# Patient Record
Sex: Male | Born: 1955
Health system: Southern US, Community
[De-identification: ages and names within clinical notes are randomized; demographics above are authoritative.]

## PROBLEM LIST (undated history)

## (undated) DIAGNOSIS — I509 Heart failure, unspecified: Secondary | ICD-10-CM

## (undated) DIAGNOSIS — C801 Malignant (primary) neoplasm, unspecified: Secondary | ICD-10-CM

## (undated) DIAGNOSIS — T8859XA Other complications of anesthesia, initial encounter: Secondary | ICD-10-CM

## (undated) DIAGNOSIS — I10 Essential (primary) hypertension: Secondary | ICD-10-CM

## (undated) DIAGNOSIS — K219 Gastro-esophageal reflux disease without esophagitis: Secondary | ICD-10-CM

## (undated) DIAGNOSIS — E7800 Pure hypercholesterolemia, unspecified: Secondary | ICD-10-CM

## (undated) DIAGNOSIS — J45909 Unspecified asthma, uncomplicated: Secondary | ICD-10-CM

## (undated) DIAGNOSIS — I219 Acute myocardial infarction, unspecified: Secondary | ICD-10-CM

## (undated) DIAGNOSIS — T4145XA Adverse effect of unspecified anesthetic, initial encounter: Secondary | ICD-10-CM

## (undated) HISTORY — DX: Unspecified asthma, uncomplicated: J45.909

## (undated) HISTORY — PX: COLONOSCOPY: SHX174

## (undated) HISTORY — DX: Gastro-esophageal reflux disease without esophagitis: K21.9

## (undated) HISTORY — DX: Pure hypercholesterolemia, unspecified: E78.00

## (undated) HISTORY — PX: VASECTOMY: SHX75

---
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
## 1998-09-11 ENCOUNTER — Ambulatory Visit (HOSPITAL_COMMUNITY): Admission: RE | Admit: 1998-09-11 | Discharge: 1998-09-11 | Payer: Self-pay | Admitting: Geriatric Medicine

## 2013-01-20 ENCOUNTER — Encounter: Payer: Self-pay | Admitting: Internal Medicine

## 2013-01-24 ENCOUNTER — Ambulatory Visit (INDEPENDENT_AMBULATORY_CARE_PROVIDER_SITE_OTHER)
Admission: RE | Admit: 2013-01-24 | Discharge: 2013-01-24 | Disposition: A | Discharge status: Home / Self Care | Payer: BC Managed Care – PPO | Source: Ambulatory Visit | Attending: Internal Medicine | Admitting: Internal Medicine

## 2013-01-24 ENCOUNTER — Encounter: Payer: Self-pay | Admitting: Internal Medicine

## 2013-01-24 ENCOUNTER — Other Ambulatory Visit: Payer: BC Managed Care – PPO

## 2013-01-24 ENCOUNTER — Ambulatory Visit (INDEPENDENT_AMBULATORY_CARE_PROVIDER_SITE_OTHER): Payer: BC Managed Care – PPO | Admitting: Internal Medicine

## 2013-01-24 VITALS — BP 138/84 | HR 105 | Ht 69.5 in | Wt 183.8 lb

## 2013-01-24 DIAGNOSIS — J4520 Mild intermittent asthma, uncomplicated: Secondary | ICD-10-CM

## 2013-01-24 DIAGNOSIS — J45909 Unspecified asthma, uncomplicated: Secondary | ICD-10-CM

## 2013-01-24 DIAGNOSIS — J309 Allergic rhinitis, unspecified: Secondary | ICD-10-CM

## 2013-01-24 DIAGNOSIS — J45998 Other asthma: Secondary | ICD-10-CM

## 2013-01-24 DIAGNOSIS — K219 Gastro-esophageal reflux disease without esophagitis: Secondary | ICD-10-CM

## 2013-01-24 MED ORDER — FLUTICASONE FUROATE-VILANTEROL 100-25 MCG/INH IN AEPB: 1.0000 | INHALATION_SPRAY | Freq: Every day | RESPIRATORY_TRACT | Status: DC

## 2013-01-24 NOTE — Progress Notes (Signed)
Subjective:    Patient ID: Jerry Montoya, male    DOB: 10/29/1955, 57 y.o.   MRN: [id number]  HPI 01/24/13- 48 yoM never smoker Ref by Dr. Pete Glatter for help with management of moderate asthma-- pt reports had bad episode of reflux and aspiration which caused asthma attack -- currently using ventolin and advair. This event woke him may fifth. He recognized that he had aspirated and called 911. Now diet and Nexium management for reflux. Some past history of allergic rhinitis. Asthma with bronchitis since age 64, attributed to allergy and seasonal-spring and fall. Tends to need prednisone for rhinitis. Occasional use of rescue inhaler. Some history of mild exercise-induced asthma. Currently feels well. 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 Lives with wife and daughter. No family history of asthma.  Prior to Admission medications   Medication Sig Start Date End Date Taking? Authorizing Provider  albuterol (PROVENTIL HFA;VENTOLIN HFA) 108 (90 BASE) MCG/ACT inhaler Inhale 2 puffs into the lungs every 4 (four) hours as needed for wheezing.   Yes Historical Provider, MD  esomeprazole (NEXIUM) 40 MG capsule Take 40 mg by mouth daily before breakfast.   Yes Historical Provider, MD  Fluticasone Furoate-Vilanterol (BREO ELLIPTA) 100-25 MCG/INH AEPB Inhale 1 puff into the lungs daily. 01/24/13   Waymon Budge, MD  Fluticasone-Salmeterol (ADVAIR) 250-50 MCG/DOSE AEPB Inhale 1 puff into the lungs every 12 (twelve) hours.   Yes Historical Provider, MD   Past Medical History  Diagnosis Date  . Asthma   . Hypercholesterolemia   . Esophageal reflux    No past surgical history on file. Family History  Problem Relation Age of Onset  . Coronary artery disease Father    History   Social History  . Marital Status: Single    Spouse Name: N/A    Number of Children: 2  . Years of Education: N/A   Occupational History  . computer tech    Social History Main Topics  . Smoking  status: Never Smoker   . Smokeless tobacco: Never Used  . Alcohol Use: No  . Drug Use: No  . Sexually Active: Not on file   Other Topics Concern  . Not on file   Social History Narrative  . No narrative on file    Review of Systems  Constitutional: Negative for fever and unexpected weight change.  HENT: Negative for ear pain, nosebleeds, congestion, sore throat, rhinorrhea, sneezing, trouble swallowing, dental problem, postnasal drip and sinus pressure.   Eyes: Negative for redness and itching.  Respiratory: Negative for cough, chest tightness, shortness of breath and wheezing.   Cardiovascular: Negative for palpitations and leg swelling.  Gastrointestinal: Negative for nausea and vomiting.  Genitourinary: Negative for dysuria.  Musculoskeletal: Negative for joint swelling.  Skin: Negative for rash.  Neurological: Negative for headaches.  Hematological: Does not bruise/bleed easily.  Psychiatric/Behavioral: Negative for dysphoric mood. The patient is not nervous/anxious.       Objective:   Physical Exam OBJ- Physical Exam General- Alert, Oriented, Affect-appropriate/ calm, Distress- none acute. Trim Skin- rash-none, lesions- none, excoriation- none Lymphadenopathy- none Head- atraumatic            Eyes- Gross vision intact, PERRLA, conjunctivae and secretions clear            Ears- Hearing, canals-normal            Nose- Clear, no-Septal dev, mucus, polyps, erosion, perforation             Throat-  Mallampati II , mucosa clear , drainage- none, tonsils- atrophic Neck- flexible , trachea midline, no stridor , thyroid nl, carotid no bruit Chest - symmetrical excursion , unlabored           Heart/CV- RRR , no murmur , no gallop  , no rub, nl s1 s2                           - JVD- none , edema- none, stasis changes- none, varices- none           Lung- clear to P&A, wheeze- none, cough+dry, with deep breath , dullness-none, rub- none           Chest wall-  Abd- tender-no,  distended-no, bowel sounds-present, HSM- no Br/ Gen/ Rectal- Not done, not indicated Extrem- cyanosis- none, clubbing, none, atrophy- none, strength- nl Neuro- grossly intact to observation         Assessment & Plan:

## 2013-01-24 NOTE — Patient Instructions (Addendum)
Sample Breo Ellipta inhaler   1 puff, then rinse mouth, just once daily     Try this instead of Advair for comparison, When the sample is done, go back to Advair  Order- lab Allergy profile    Dx allergic rhinitis, asthma             CXR- dx asthma              Schedule PFT   Dx asthma

## 2013-01-25 LAB — ALLERGY FULL PROFILE
Alternaria Alternata: <0.1 kU/L
Bahia Grass: 0.16 kU/L — ABNORMAL HIGH
Bermuda Grass: 0.14 kU/L — ABNORMAL HIGH
Cat Dander: 14.5 kU/L — ABNORMAL HIGH
Curvularia lunata: <0.1 kU/L
Dog Dander: 2.76 kU/L — ABNORMAL HIGH
Fescue: 0.14 kU/L — ABNORMAL HIGH
Goldenrod: 0.12 kU/L — ABNORMAL HIGH
Helminthosporium halodes: <0.1 kU/L
Lamb's Quarters: 0.23 kU/L — ABNORMAL HIGH
Sycamore Tree: 0.12 kU/L — ABNORMAL HIGH

## 2013-01-25 NOTE — Progress Notes (Signed)
Quick Note:  Patient returned call. Advised of cxr results / recs as stated by CDY. Pt verbalized understanding and denied any questions. ______

## 2013-01-26 ENCOUNTER — Telehealth: Payer: Self-pay | Admitting: Internal Medicine

## 2013-01-26 NOTE — Progress Notes (Signed)
Quick Note:  LMTCB ______ 

## 2013-01-26 NOTE — Telephone Encounter (Signed)
Result Note    Allergy profile- significant elevations of allergy class antibodies to broad classes of common allergens. We will discuss at next ov.   I spoke with patient about results and he verbalized understanding and had no questions

## 2013-02-06 ENCOUNTER — Encounter: Payer: Self-pay | Admitting: Internal Medicine

## 2013-02-06 DIAGNOSIS — K219 Gastro-esophageal reflux disease without esophagitis: Secondary | ICD-10-CM | POA: Insufficient documentation

## 2013-02-06 DIAGNOSIS — J45909 Unspecified asthma, uncomplicated: Secondary | ICD-10-CM | POA: Insufficient documentation

## 2013-02-06 DIAGNOSIS — J45998 Other asthma: Secondary | ICD-10-CM | POA: Insufficient documentation

## 2013-02-06 NOTE — Assessment & Plan Note (Signed)
Sleep-related GERD event described. He is now on Nexium and understands not eat before bed.

## 2013-02-06 NOTE — Assessment & Plan Note (Signed)
Identified triggers include reflux, seasonal allergy, exercise. He understands major importance of controlling reflux. He minimizes wheezing with exertion. We will evaluate airway status and importance of allergy. Plan-continue reflux precautions and Nexium. Schedule PFT and chest x-ray. Allergy profile. Try sample Breo.

## 2013-03-15 ENCOUNTER — Ambulatory Visit (INDEPENDENT_AMBULATORY_CARE_PROVIDER_SITE_OTHER): Payer: BC Managed Care – PPO | Admitting: Internal Medicine

## 2013-03-15 ENCOUNTER — Encounter: Payer: Self-pay | Admitting: Internal Medicine

## 2013-03-15 VITALS — BP 122/78 | HR 85 | Ht 69.0 in | Wt 183.0 lb

## 2013-03-15 DIAGNOSIS — J45909 Unspecified asthma, uncomplicated: Secondary | ICD-10-CM

## 2013-03-15 DIAGNOSIS — J4520 Mild intermittent asthma, uncomplicated: Secondary | ICD-10-CM

## 2013-03-15 DIAGNOSIS — J45901 Unspecified asthma with (acute) exacerbation: Secondary | ICD-10-CM | POA: Insufficient documentation

## 2013-03-15 DIAGNOSIS — J45998 Other asthma: Secondary | ICD-10-CM

## 2013-03-15 LAB — PULMONARY FUNCTION TEST

## 2013-03-15 MED ORDER — FLUTICASONE FUROATE-VILANTEROL 100-25 MCG/INH IN AEPB: 1.0000 | INHALATION_SPRAY | RESPIRATORY_TRACT | Status: DC

## 2013-03-15 MED ORDER — FLUTICASONE FUROATE-VILANTEROL 100-25 MCG/INH IN AEPB: 1.0000 | INHALATION_SPRAY | Freq: Every day | RESPIRATORY_TRACT | Status: DC

## 2013-03-15 MED ORDER — TIOTROPIUM BROMIDE MONOHYDRATE 18 MCG IN CAPS: 18.0000 ug | ORAL_CAPSULE | Freq: Every day | RESPIRATORY_TRACT | Status: DC

## 2013-03-15 NOTE — Progress Notes (Signed)
PFT done today. 

## 2013-03-15 NOTE — Patient Instructions (Addendum)
Sample and script for Breo Ellipta    1 puff then rinse mouth well, once daily       Compare with Advair  Sample Spiriva     1 daily

## 2013-03-15 NOTE — Progress Notes (Signed)
Subjective:    Patient ID: Jerry Montoya, male    DOB: April 08, 1956, 57 y.o.   MRN: [id number]  HPI 01/24/13- 63 yoM never smoker Ref by Dr. Pete Glatter for help with management of moderate asthma-- pt reports had bad episode of reflux and aspiration which caused asthma attack -- currently using ventolin and advair. This event woke him May fifth. He recognized that he had aspirated and called 911. Now diet and Nexium management for reflux. Some past history of allergic rhinitis. Asthma with bronchitis since age 26, attributed to allergy and seasonal-spring and fall. Tends to need prednisone for rhinitis. Occasional use of rescue inhaler. Some history of mild exercise-induced asthma. Currently feels well. 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 Lives with wife and daughter. No family history of asthma.     03/15/13- 57 yoM never smoker followed for moderate asthma, hx reflux, allergic rhinitis      PCP Dr Pete Glatter FOLLOWS FOR: has alot of congestion; tried Breo-thinks it helped. Review PFT with patient as well. One month increased postnasal drip. Continues Nexium without recognizing reflux. Allergy Profile 01/24/2013-total IgE 383.2 the broad elevations for most specific allergen categories except molds. PFT 03/15/2013- severe obstructive airways disease with response to bronchodilator, air trapping, normal diffusion. FVC 3.63/78%, FEV1 2.14/60%, FEV1/FVC 0.59/77% TLC 94%, DLCO 101%. CXR 01/24/13 CHEST - 2 VIEW  Comparison: None  Findings: Pulmonary hyperinflation with chronic lung disease. 1 cm  round density adjacent to the right hilum is consistent with a  granuloma and appears calcified. Negative for heart failure or  pneumonia. Linear density in the lingula consistent with scarring.  Thoracic fracture appears chronic in approximately T5.  IMPRESSION:  Chronic lung disease. No acute cardiopulmonary abnormality.  Original Report Authenticated By: Janeece Riggers,  M.D.  Review of Systems  Constitutional: Negative for fever and unexpected weight change.  HENT: Negative for ear pain, nosebleeds, congestion, sore throat, rhinorrhea, sneezing, trouble swallowing, dental problem, postnasal drip and sinus pressure.   Eyes: Negative for redness and itching.  Respiratory: Negative for cough, chest tightness,  and wheezing.   Cardiovascular: Negative for palpitations and leg swelling.  Gastrointestinal: Negative for nausea and vomiting.  Genitourinary: Negative for dysuria.  Musculoskeletal: Negative for joint swelling.  Skin: Negative for rash.  Neurological: Negative for headaches.  Hematological: Does not bruise/bleed easily.  Psychiatric/Behavioral: Negative for dysphoric mood. The patient is not nervous/anxious.       Objective:   Physical Exam OBJ- Physical Exam General- Alert, Oriented, Affect-appropriate/ calm, Distress- none acute. Trim Skin- rash-none, lesions- none, excoriation- none Lymphadenopathy- none Head- atraumatic            Eyes- Gross vision intact, PERRLA, conjunctivae and secretions clear            Ears- Hearing, canals-normal            Nose- Clear, no-Septal dev, mucus, polyps, erosion, perforation             Throat- Mallampati II , mucosa clear , drainage- none, tonsils- atrophic Neck- flexible , trachea midline, no stridor , thyroid nl, carotid no bruit Chest - symmetrical excursion , unlabored           Heart/CV- RRR , no murmur , no gallop  , no rub, nl s1 s2                           - JVD- none , edema- none, stasis changes-  none, varices- none           Lung- +few crackles right base, wheeze- none, cough-none , dullness-none, rub- none           Chest wall-  Abd-  Br/ Gen/ Rectal- Not done, not indicated Extrem- cyanosis- none, clubbing, none, atrophy- none, strength- nl Neuro- grossly intact to observation  Assessment & Plan:

## 2013-03-27 NOTE — Assessment & Plan Note (Addendum)
Severe chronic asthma in a nonsmoker Identified triggers include reflux, seasonal allergy, exercise PFT 03/15/2013- severe obstructive airways disease with response to bronchodilator, air trapping, normal diffusion. FVC 3.63/78%, FEV1 2.14/60%, FEV1/FVC 0.59/77% TLC 94%, DLCO 101% Allergy Profile 01/24/2013-total IgE 383.2 with broad elevations for most specific allergen categories except molds. Functionally this is chronic asthma with COPD and a significant allergic component. Unsure how important GERD also is. Plan-try combination of Breo Ellipta and Spiriva. Environmental precautions. Consider allergy skin testing.

## 2013-05-16 ENCOUNTER — Ambulatory Visit: Payer: BC Managed Care – PPO | Admitting: Internal Medicine

## 2013-07-21 DIAGNOSIS — C801 Malignant (primary) neoplasm, unspecified: Secondary | ICD-10-CM

## 2013-07-21 DIAGNOSIS — I219 Acute myocardial infarction, unspecified: Secondary | ICD-10-CM

## 2013-07-21 HISTORY — DX: Malignant (primary) neoplasm, unspecified: C80.1

## 2013-07-21 HISTORY — DX: Acute myocardial infarction, unspecified: I21.9

## 2014-02-20 ENCOUNTER — Encounter (INDEPENDENT_AMBULATORY_CARE_PROVIDER_SITE_OTHER): Payer: Self-pay | Admitting: General Surgery

## 2014-02-20 ENCOUNTER — Ambulatory Visit (INDEPENDENT_AMBULATORY_CARE_PROVIDER_SITE_OTHER): Payer: BC Managed Care – PPO | Admitting: General Surgery

## 2014-02-20 ENCOUNTER — Other Ambulatory Visit (INDEPENDENT_AMBULATORY_CARE_PROVIDER_SITE_OTHER): Payer: Self-pay | Admitting: General Surgery

## 2014-02-20 DIAGNOSIS — C187 Malignant neoplasm of sigmoid colon: Secondary | ICD-10-CM

## 2014-02-20 DIAGNOSIS — C189 Malignant neoplasm of colon, unspecified: Secondary | ICD-10-CM

## 2014-02-20 NOTE — Progress Notes (Signed)
Patient ID: Jerry Montoya, male   DOB: 1955/10/09, 58 y.o.   MRN: [id number]  Chief Complaint  Patient presents with  . Colon Cancer    HPI Jerry Montoya is a 58 y.o. male.   HPI  He is referred by Dr. Earle Montoya because of newly diagnosed sigmoid colon cancer. He underwent his first screening colonoscopy at age 71. A small polyp was seen in the cecum and was benign. A 12 mm polyp was seen in the sigmoid colon. It was removed it was positive for invasive adenocarcinoma invading into at least the submucosa. He underwent a flexible sigmoidoscopy and tattooing of the lesion recently. He is here to discuss further evaluation and treatment. No history of colorectal cancer in the family that he is aware of. He otherwise feels in his normal state of health.  He is here with his wife.  Past Medical History  Diagnosis Date  . Asthma   . Hypercholesterolemia   . Esophageal reflux     Past Surgical History  Procedure Laterality Date  . Vasectomy      23 years ago    Family History  Problem Relation Age of Onset  . Coronary artery disease Father     Social History History  Substance Use Topics  . Smoking status: Never Smoker   . Smokeless tobacco: Never Used  . Alcohol Use: No    No Known Allergies  Current Outpatient Prescriptions  Medication Sig Dispense Refill  . albuterol (PROVENTIL HFA;VENTOLIN HFA) 108 (90 BASE) MCG/ACT inhaler Inhale 2 puffs into the lungs every 4 (four) hours as needed for wheezing.      [name] Kitchen esomeprazole (NEXIUM) 40 MG capsule Take 40 mg by mouth daily before breakfast.      . Fluticasone Furoate-Vilanterol (BREO ELLIPTA) 100-25 MCG/INH AEPB Inhale 1 puff into the lungs daily.  1 each  0  . Fluticasone Furoate-Vilanterol (BREO ELLIPTA) 100-25 MCG/INH AEPB Inhale 1 puff into the lungs 1 day or 1 dose. Rinse mouth  28 each  prn  . Fluticasone Furoate-Vilanterol (BREO ELLIPTA) 100-25 MCG/INH AEPB Inhale 1 puff into the lungs daily.  1 each  0  .  Fluticasone-Salmeterol (ADVAIR) 250-50 MCG/DOSE AEPB Inhale 1 puff into the lungs every 12 (twelve) hours.      [name] Kitchen tiotropium (SPIRIVA) 18 MCG inhalation capsule Place 1 capsule (18 mcg total) into inhaler and inhale daily.  10 capsule  0   No current facility-administered medications for this visit.    Review of Systems Review of Systems  Constitutional: Negative.   HENT: Negative.   Respiratory: Positive for wheezing (when he has episodes of asthma.).   Cardiovascular: Negative.   Gastrointestinal: Negative.   Genitourinary: Negative.   Musculoskeletal: Negative.   Neurological: Negative.   Hematological: Negative.     There were no vitals taken for this visit.  Physical Exam Physical Exam  Constitutional: He appears well-developed and well-nourished. No distress.  HENT:  Head: Normocephalic and atraumatic.  Eyes: EOM are normal. No scleral icterus.  Neck: Neck supple.  Cardiovascular: Normal rate and regular rhythm.   Pulmonary/Chest: Effort normal and breath sounds normal.  Abdominal: Soft. He exhibits no distension and no mass. There is no tenderness.  Musculoskeletal: He exhibits no edema.  Lymphadenopathy:    He has no cervical adenopathy.  Neurological: He is alert.  Skin: Skin is warm and dry.  Psychiatric: He has a normal mood and affect. His behavior is normal.    Data Reviewed Colonoscopy report.  Pathology report. Note from Dr. Wynetta Montoya.  Assessment    Sigmoid colon cancer. Area of polypectomy has been marked with a tattoo recently.     Plan    1. Staging CT of the chest, abdomen, and pelvis.  2. Laparoscopic-assisted partial colectomy. I have explained the procedure and risks of colon resection.  Risks include but are not limited to bleeding, infection, wound problems, anesthesia, anastomotic leak, need for colostomy, need for reoperative surgery, injury to intraabominal organs (such as intestine, spleen, kidney, bladder, ureter, etc.), ileus, irregular  bowel habits.  He seems to understand and agrees with the plan.       Jerry Montoya J 02/20/2014, 6:08 PM

## 2014-02-20 NOTE — Patient Instructions (Signed)
COLON BOWEL PREP                                                                          Please follow the instructions carefully. It is important to clean out your bowels & take the prescribed antibiotic pills to lower your chances of a wound infection or abscess.   FIVE DAYS PRIOR TO YOUR SURGERY  Stop eating any nuts, popcorn, or fruit with seeds. Stop all fiber supplements such as Metamucil, Citrucel, etc.   Hold taking any blood thinning anticoagulation medication (ex: aspirin, warfarin/Coumadin, Plavix, Xarelto, Eliquis, Pradaxa, etc) as recommended by your medical/cardiology doctor  Obtain what you need at a pharmacy of your choice: -Filled out prescriptions for your oral antibiotics (Neomycin & Metronidazole)  -A bottle of MiraLax / Glycolax (288g) - no prescription required  -A large bottle of Gatorade / Powerade (64oz)  -Dulcolax tablets (4 tabs) - no prescription required    DAY PRIOR TO SURGERY   7:00am Swallow 4 Dulcolax tablets with some water Drink plenty of clear liquids all day to avoid getting dehydrated (Water, juice, soda, coffee, tea, bouillon, jello, etc.)  10:00am Mix the bottle of MiraLax with the 64-oz bottle of Gatorade.  Drink the Gatorade mixture gradually over the next few hours (8oz glass every 15-30 minutes) until gone. You should finish by 2pm.  2:00pm Take 2 Neomycin 500mg  tablets & 2 Metronidazole 500mg  tablets  3:00pm Take 2 Neomycin 500mg  tablets & 2 Metronidazole 500mg  tablets  Drink plenty of clear liquids all evening to avoid getting dehydrated  10:00pm Take 2 Neomycin 500mg  tablets & 2 Metronidazole 500mg  tablets  Do not eat or drink anything after bedtime (midnight) the night before your surgery.   MORNING OF SURGERY Remember to not to drink or eat anything that morning  Hold or take medications as recommended by the hospital staff at your Preoperative visit  If you have questions or concerns, please call [facility] [phone]  [phone] during business hours to speak to the clinical staff for advice.     [facility], [place] [phone]  OPEN ABDOMINAL SURGERY: POST OP INSTRUCTIONS  Always review your discharge instruction sheet given to you by the facility where your surgery was performed.  IF YOU HAVE DISABILITY OR FAMILY LEAVE FORMS, YOU MUST BRING THEM TO THE OFFICE FOR PROCESSING.  PLEASE DO NOT GIVE THEM TO YOUR DOCTOR.  1. A prescription for pain medication may be given to you upon discharge.  Take your pain medication as prescribed, if needed.  If narcotic pain medicine is not needed, then you may take acetaminophen (Tylenol) or ibuprofen (Advil) as needed. 2. Take your usually prescribed medications unless otherwise directed. 3. If you need a refill on your pain medication, please contact your pharmacy. They will contact our office to request authorization.  Prescriptions will not be filled after 5pm or on week-ends. 4. You should follow a bland diet.  Most patients will experience some swelling and bruising in the area of the incision. Ice pack will help. Swelling and bruising can take several days to resolve..  5. It is common to experience some constipation if taking pain medication after surgery.  Increasing  fluid intake and taking a stool softener will usually help or prevent this problem from occurring.  A mild laxative (Milk of Magnesia or Miralax) should be taken according to package directions if there are no bowel movements after 48 hours. 6.  You may have steri-strips (small skin tapes) in place directly over the incision.  These strips should be left on the skin for 7-10 days.  If your surgeon used skin glue on the incision, you may shower in 24 hours.  The glue will flake off over the next 2-3 weeks.  Any sutures or staples will be removed at the office during your follow-up visit. You may find that a light gauze bandage over your incision may keep your staples from being rubbed or  pulled. You may shower and replace the bandage daily. 7. ACTIVITIES:  You may resume regular (light) daily activities beginning the next day-such as daily self-care, walking, climbing stairs-gradually increasing activities as tolerated.  You may have sexual intercourse when it is comfortable.  Refrain from any heavy lifting or straining until approved by your doctor. a. You may drive when you no longer are taking prescription pain medication, you can comfortably wear a seatbelt, and you can safely maneuver your car and apply brakes b. Return to Work: ___________________________________ 8. You should see your doctor in the office for a follow-up appointment approximately two weeks after your surgery.  Make sure that you call for this appointment within a day or two after you arrive home to insure a convenient appointment time. OTHER INSTRUCTIONS:  _____________________________________________________________ _____________________________________________________________  WHEN TO CALL YOUR DOCTOR: 1. Fever over 101.0 2. Inability to urinate 3. Nausea and/or vomiting 4. Extreme swelling or bruising 5. Continued bleeding from incision. 6. Increased pain, redness, or drainage from the incision.  The clinic staff is available to answer your questions during regular business hours.  Please don't hesitate to call and ask to speak to one of the nurses if you have concerns.  For further questions, please visit [email or web address]

## 2014-02-21 ENCOUNTER — Other Ambulatory Visit (INDEPENDENT_AMBULATORY_CARE_PROVIDER_SITE_OTHER): Payer: Self-pay | Admitting: *Deleted

## 2014-02-22 ENCOUNTER — Telehealth (INDEPENDENT_AMBULATORY_CARE_PROVIDER_SITE_OTHER): Payer: Self-pay

## 2014-02-22 ENCOUNTER — Other Ambulatory Visit (INDEPENDENT_AMBULATORY_CARE_PROVIDER_SITE_OTHER): Payer: Self-pay | Admitting: General Surgery

## 2014-02-22 DIAGNOSIS — C187 Malignant neoplasm of sigmoid colon: Secondary | ICD-10-CM

## 2014-02-22 NOTE — Telephone Encounter (Signed)
Pt instructed to go by [facility] for CMET prior to his CT scans on 02/24/14.  Orders are in [place].

## 2014-02-23 ENCOUNTER — Ambulatory Visit
Admission: RE | Admit: 2014-02-23 | Discharge: 2014-02-23 | Disposition: A | Discharge status: Home / Self Care | Payer: BC Managed Care – PPO | Source: Ambulatory Visit | Attending: General Surgery | Admitting: General Surgery

## 2014-02-23 DIAGNOSIS — C187 Malignant neoplasm of sigmoid colon: Secondary | ICD-10-CM

## 2014-02-23 LAB — COMPREHENSIVE METABOLIC PANEL
ALBUMIN: 4.3 g/dL (ref 3.5–5.2)
ALT: 14 U/L (ref 0–53)
AST: 17 U/L (ref 0–37)
Alkaline Phosphatase: 69 U/L (ref 39–117)
BUN: 21 mg/dL (ref 6–23)
CO2: 25 mEq/L (ref 19–32)
Calcium: 9.4 mg/dL (ref 8.4–10.5)
Chloride: 102 mEq/L (ref 96–112)
Creat: 0.89 mg/dL (ref 0.50–1.35)
Glucose, Bld: 123 mg/dL — ABNORMAL HIGH (ref 70–99)
POTASSIUM: 3.8 meq/L (ref 3.5–5.3)
SODIUM: 136 meq/L (ref 135–145)
TOTAL PROTEIN: 6.6 g/dL (ref 6.0–8.3)
Total Bilirubin: 0.4 mg/dL (ref 0.2–1.2)

## 2014-02-23 MED ORDER — IOHEXOL 300 MG/ML  SOLN
100.0000 mL | Freq: Once | INTRAMUSCULAR | Status: AC | PRN
  Administered 2014-02-23: 100 mL via INTRAVENOUS

## 2014-03-02 ENCOUNTER — Telehealth (INDEPENDENT_AMBULATORY_CARE_PROVIDER_SITE_OTHER): Payer: Self-pay

## 2014-03-02 NOTE — Telephone Encounter (Signed)
Pt notified his CT scan shows no spread of his cancer.  Surgery is in the process of being scheduled.  I told the patient if he does not hear from [place] by early next week, to call and ask to speak to surgery schedulers.

## 2014-03-17 NOTE — Patient Instructions (Signed)
HENOK HEACOCK  03/17/2014   Your procedure is scheduled on:  03/23/2014    Report to [facility].  Follow the Signs to [place] at   [place]     am  Call this number if you have problems the morning of surgery: [phone]   Remember:   Do not eat food or drink liquids after midnight.   Take these medicines the morning of surgery with A SIP OF WATER:    Do not wear jewelry,   Do not wear lotions, powders, or perfumes., deodorant.     Men may shave face and neck.  Do not bring valuables to the hospital.  Contacts, dentures or bridgework may not be worn into surgery.  Leave suitcase in the car. After surgery it may be brought to your room.  For patients admitted to the hospital, checkout time is 11:00 AM the day of  discharge.        Please read over the following fact sheets that you were given: [facility] - Preparing for Surgery Before surgery, you can play an important role.  Because skin is not sterile, your skin needs to be as free of germs as possible.  You can reduce the number of germs on your skin by washing with CHG (chlorahexidine gluconate) soap before surgery.  CHG is an antiseptic cleaner which kills germs and bonds with the skin to continue killing germs even after washing. Please DO NOT use if you have an allergy to CHG or antibacterial soaps.  If your skin becomes reddened/irritated stop using the CHG and inform your nurse when you arrive at Short Stay. Do not shave (including legs and underarms) for at least 48 hours prior to the first CHG shower.  You may shave your face/neck. Please follow these instructions carefully:  1.  Shower with CHG Soap the night before surgery and the  morning of Surgery.  2.  If you choose to wash your hair, wash your hair first as usual with your  normal  shampoo.  3.  After you shampoo, rinse your hair and body thoroughly to remove the  shampoo.                           4.  Use CHG as you would any other liquid soap.   You can apply chg directly  to the skin and wash                       Gently with a scrungie or clean washcloth.  5.  Apply the CHG Soap to your body ONLY FROM THE NECK DOWN.   Do not use on face/ open                           Wound or open sores. Avoid contact with eyes, ears mouth and genitals (private parts).                       Wash face,  Genitals (private parts) with your normal soap.             6.  Wash thoroughly, paying special attention to the area where your surgery  will be performed.  7.  Thoroughly rinse your body with warm water from the neck down.  8.  DO NOT shower/wash with your normal soap after using and rinsing off  the CHG Soap.                9.  Pat yourself dry with a clean towel.            10.  Wear clean pajamas.            11.  Place clean sheets on your bed the night of your first shower and do not  sleep with pets. Day of Surgery : Do not apply any lotions/deodorants the morning of surgery.  Please wear clean clothes to the hospital/surgery center.  FAILURE TO FOLLOW THESE INSTRUCTIONS MAY RESULT IN THE CANCELLATION OF YOUR SURGERY PATIENT SIGNATURE_________________________________  NURSE SIGNATURE__________________________________  ________________________________________________________________________  WHAT IS A BLOOD TRANSFUSION? Blood Transfusion Information  A transfusion is the replacement of blood or some of its parts. Blood is made up of multiple cells which provide different functions.  Red blood cells carry oxygen and are used for blood loss replacement.  White blood cells fight against infection.  Platelets control bleeding.  Plasma helps clot blood.  Other blood products are available for specialized needs, such as hemophilia or other clotting disorders. BEFORE THE TRANSFUSION  Who gives blood for transfusions?   Healthy volunteers who are fully evaluated to make sure their blood is safe. This is blood bank blood. Transfusion  therapy is the safest it has ever been in the practice of medicine. Before blood is taken from a donor, a complete history is taken to make sure that person has no history of diseases nor engages in risky social behavior (examples are intravenous drug use or sexual activity with multiple partners). The donor's travel history is screened to minimize risk of transmitting infections, such as malaria. The donated blood is tested for signs of infectious diseases, such as HIV and hepatitis. The blood is then tested to be sure it is compatible with you in order to minimize the chance of a transfusion reaction. If you or a relative donates blood, this is often done in anticipation of surgery and is not appropriate for emergency situations. It takes many days to process the donated blood. RISKS AND COMPLICATIONS Although transfusion therapy is very safe and saves many lives, the main dangers of transfusion include:   Getting an infectious disease.  Developing a transfusion reaction. This is an allergic reaction to something in the blood you were given. Every precaution is taken to prevent this. The decision to have a blood transfusion has been considered carefully by your caregiver before blood is given. Blood is not given unless the benefits outweigh the risks. AFTER THE TRANSFUSION  Right after receiving a blood transfusion, you will usually feel much better and more energetic. This is especially true if your red blood cells have gotten low (anemic). The transfusion raises the level of the red blood cells which carry oxygen, and this usually causes an energy increase.  The nurse administering the transfusion will monitor you carefully for complications. HOME CARE INSTRUCTIONS  No special instructions are needed after a transfusion. You may find your energy is better. Speak with your caregiver about any limitations on activity for underlying diseases you may have. SEEK MEDICAL CARE IF:   Your condition is  not improving after your transfusion.  You develop redness or irritation at the intravenous (IV) site. SEEK IMMEDIATE MEDICAL CARE IF:  Any of the following symptoms occur over the next 12 hours:  Shaking chills.  You have a temperature by mouth above 102 F (38.9 C), not controlled by  medicine.  Chest, back, or muscle pain.  People around you feel you are not acting correctly or are confused.  Shortness of breath or difficulty breathing.  Dizziness and fainting.  You get a rash or develop hives.  You have a decrease in urine output.  Your urine turns a dark color or changes to pink, red, or brown. Any of the following symptoms occur over the next 10 days:  You have a temperature by mouth above 102 F (38.9 C), not controlled by medicine.  Shortness of breath.  Weakness after normal activity.  The white part of the eye turns yellow (jaundice).  You have a decrease in the amount of urine or are urinating less often.  Your urine turns a dark color or changes to pink, red, or brown. Document Released: 07/04/2000 Document Revised: 09/29/2011 Document Reviewed: 02/21/2008 ExitCare Patient Information 2014 [employer].  _______________________________________________________________________, coughing and deep breathing exercises, leg exercises

## 2014-03-20 ENCOUNTER — Encounter (HOSPITAL_COMMUNITY): Payer: Self-pay | Admitting: Pharmacy Technician

## 2014-03-20 ENCOUNTER — Encounter (HOSPITAL_COMMUNITY)
Admission: RE | Admit: 2014-03-20 | Discharge: 2014-03-20 | Disposition: A | Discharge status: Home / Self Care | Payer: BC Managed Care – PPO | Source: Ambulatory Visit | Attending: General Surgery | Admitting: General Surgery

## 2014-03-20 ENCOUNTER — Encounter (HOSPITAL_COMMUNITY): Payer: Self-pay

## 2014-03-20 HISTORY — DX: Malignant (primary) neoplasm, unspecified: C80.1

## 2014-03-20 LAB — CBC WITH DIFFERENTIAL/PLATELET
BASOS ABS: 0 10*3/uL (ref 0.0–0.1)
Basophils Relative: 1 % (ref 0–1)
Eosinophils Absolute: 0.4 10*3/uL (ref 0.0–0.7)
Eosinophils Relative: 6 % — ABNORMAL HIGH (ref 0–5)
HEMATOCRIT: 45.8 % (ref 39.0–52.0)
HEMOGLOBIN: 15.5 g/dL (ref 13.0–17.0)
Lymphocytes Relative: 31 % (ref 12–46)
Lymphs Abs: 2 10*3/uL (ref 0.7–4.0)
MCH: 30 pg (ref 26.0–34.0)
MCHC: 33.8 g/dL (ref 30.0–36.0)
MCV: 88.6 fL (ref 78.0–100.0)
MONO ABS: 0.5 10*3/uL (ref 0.1–1.0)
Monocytes Relative: 7 % (ref 3–12)
NEUTROS ABS: 3.5 10*3/uL (ref 1.7–7.7)
NEUTROS PCT: 55 % (ref 43–77)
Platelets: 191 10*3/uL (ref 150–400)
RBC: 5.17 MIL/uL (ref 4.22–5.81)
RDW: 13.2 % (ref 11.5–15.5)
WBC: 6.4 10*3/uL (ref 4.0–10.5)

## 2014-03-20 LAB — COMPREHENSIVE METABOLIC PANEL
ALBUMIN: 4 g/dL (ref 3.5–5.2)
ALK PHOS: 85 U/L (ref 39–117)
ALT: 39 U/L (ref 0–53)
ANION GAP: 14 (ref 5–15)
AST: 23 U/L (ref 0–37)
BILIRUBIN TOTAL: 0.3 mg/dL (ref 0.3–1.2)
BUN: 17 mg/dL (ref 6–23)
CO2: 24 mEq/L (ref 19–32)
Calcium: 9.5 mg/dL (ref 8.4–10.5)
Chloride: 102 mEq/L (ref 96–112)
Creatinine, Ser: 0.84 mg/dL (ref 0.50–1.35)
GFR calc Af Amer: >90 mL/min (ref >90)
GFR calc non Af Amer: >90 mL/min (ref >90)
GLUCOSE: 94 mg/dL (ref 70–99)
POTASSIUM: 4.2 meq/L (ref 3.7–5.3)
SODIUM: 140 meq/L (ref 137–147)
Total Protein: 7.3 g/dL (ref 6.0–8.3)

## 2014-03-20 LAB — PROTIME-INR
INR: 1.03 (ref 0.00–1.49)
Prothrombin Time: 13.5 seconds (ref 11.6–15.2)

## 2014-03-20 LAB — ABO/RH: ABO/RH(D): O POS

## 2014-03-21 LAB — CEA: CEA: 0.9 ng/mL (ref 0.0–5.0)

## 2014-03-21 NOTE — Progress Notes (Signed)
CEA results faxed to Dr [facility]

## 2014-03-23 ENCOUNTER — Encounter (HOSPITAL_COMMUNITY): Payer: BC Managed Care – PPO | Admitting: Certified Registered"

## 2014-03-23 ENCOUNTER — Inpatient Hospital Stay (HOSPITAL_COMMUNITY)
Admission: RE | Admit: 2014-03-23 | Discharge: 2014-03-28 | DRG: 330 | Disposition: A | Discharge status: Home / Self Care | Payer: BC Managed Care – PPO | Source: Ambulatory Visit | Attending: General Surgery | Admitting: General Surgery

## 2014-03-23 ENCOUNTER — Encounter (HOSPITAL_COMMUNITY): Payer: Self-pay | Admitting: *Deleted

## 2014-03-23 ENCOUNTER — Inpatient Hospital Stay (HOSPITAL_COMMUNITY): Payer: BC Managed Care – PPO | Admitting: Certified Registered"

## 2014-03-23 ENCOUNTER — Encounter (HOSPITAL_COMMUNITY)
Admission: RE | Disposition: A | Discharge status: Home / Self Care | Payer: Self-pay | Source: Ambulatory Visit | Attending: General Surgery

## 2014-03-23 DIAGNOSIS — Z01812 Encounter for preprocedural laboratory examination: Secondary | ICD-10-CM | POA: Diagnosis not present

## 2014-03-23 DIAGNOSIS — J45909 Unspecified asthma, uncomplicated: Secondary | ICD-10-CM | POA: Diagnosis present

## 2014-03-23 DIAGNOSIS — C187 Malignant neoplasm of sigmoid colon: Secondary | ICD-10-CM | POA: Diagnosis present

## 2014-03-23 DIAGNOSIS — K929 Disease of digestive system, unspecified: Secondary | ICD-10-CM | POA: Diagnosis not present

## 2014-03-23 DIAGNOSIS — K560 Paralytic ileus: Secondary | ICD-10-CM | POA: Diagnosis not present

## 2014-03-23 DIAGNOSIS — Y836 Removal of other organ (partial) (total) as the cause of abnormal reaction of the patient, or of later complication, without mention of misadventure at the time of the procedure: Secondary | ICD-10-CM | POA: Diagnosis not present

## 2014-03-23 DIAGNOSIS — Z79899 Other long term (current) drug therapy: Secondary | ICD-10-CM

## 2014-03-23 DIAGNOSIS — Z8249 Family history of ischemic heart disease and other diseases of the circulatory system: Secondary | ICD-10-CM

## 2014-03-23 DIAGNOSIS — K219 Gastro-esophageal reflux disease without esophagitis: Secondary | ICD-10-CM | POA: Diagnosis present

## 2014-03-23 DIAGNOSIS — E7800 Pure hypercholesterolemia, unspecified: Secondary | ICD-10-CM | POA: Diagnosis present

## 2014-03-23 DIAGNOSIS — C189 Malignant neoplasm of colon, unspecified: Secondary | ICD-10-CM | POA: Diagnosis present

## 2014-03-23 HISTORY — PX: LAPAROSCOPIC PARTIAL COLECTOMY: SHX5907

## 2014-03-23 LAB — TYPE AND SCREEN
ABO/RH(D): O POS
ANTIBODY SCREEN: NEGATIVE

## 2014-03-23 SURGERY — LAPAROSCOPIC PARTIAL COLECTOMY
Anesthesia: General | Site: Abdomen

## 2014-03-23 MED ORDER — LIDOCAINE HCL (CARDIAC) 20 MG/ML IV SOLN
INTRAVENOUS | Status: DC | PRN
  Administered 2014-03-23: 50 mg via INTRAVENOUS

## 2014-03-23 MED ORDER — PROPOFOL 10 MG/ML IV BOLUS
INTRAVENOUS | Status: DC | PRN
  Administered 2014-03-23: 180 mg via INTRAVENOUS

## 2014-03-23 MED ORDER — MORPHINE SULFATE (PF) 1 MG/ML IV SOLN
INTRAVENOUS | Status: DC
  Administered 2014-03-23: 33.75 mg via INTRAVENOUS
  Administered 2014-03-23: 15.78 mg via INTRAVENOUS
  Administered 2014-03-23: 1 mg via INTRAVENOUS
  Administered 2014-03-24: 01:00:00 via INTRAVENOUS
  Administered 2014-03-24: 7.5 mg via INTRAVENOUS
  Administered 2014-03-24 (×2): via INTRAVENOUS
  Administered 2014-03-25: 6 mg via INTRAVENOUS
  Administered 2014-03-25: 12 mg via INTRAVENOUS
  Administered 2014-03-25: 10.5 mg via INTRAVENOUS
  Administered 2014-03-25: 13:00:00 via INTRAVENOUS
  Administered 2014-03-25 – 2014-03-26 (×2): 4.5 mg via INTRAVENOUS
  Administered 2014-03-26: 16.5 mg via INTRAVENOUS
  Filled 2014-03-23 (×6): qty 25

## 2014-03-23 MED ORDER — LACTATED RINGERS IV SOLN
INTRAVENOUS | Status: DC | PRN
  Administered 2014-03-23: 1000 mL

## 2014-03-23 MED ORDER — NALOXONE HCL 0.4 MG/ML IJ SOLN
0.4000 mg | INTRAMUSCULAR | Status: DC | PRN
Start: 2014-03-23 — End: 2014-03-26

## 2014-03-23 MED ORDER — DEXAMETHASONE SODIUM PHOSPHATE 10 MG/ML IJ SOLN
INTRAMUSCULAR | Status: AC
  Filled 2014-03-23: qty 1

## 2014-03-23 MED ORDER — ROCURONIUM BROMIDE 100 MG/10ML IV SOLN
INTRAVENOUS | Status: AC
  Filled 2014-03-23: qty 1

## 2014-03-23 MED ORDER — DIPHENHYDRAMINE HCL 12.5 MG/5ML PO ELIX
12.5000 mg | ORAL_SOLUTION | Freq: Four times a day (QID) | ORAL | Status: DC | PRN
Start: 2014-03-23 — End: 2014-03-26

## 2014-03-23 MED ORDER — ROCURONIUM BROMIDE 100 MG/10ML IV SOLN
INTRAVENOUS | Status: DC | PRN
  Administered 2014-03-23 (×2): 10 mg via INTRAVENOUS
  Administered 2014-03-23: 5 mg via INTRAVENOUS
  Administered 2014-03-23 (×2): 10 mg via INTRAVENOUS
  Administered 2014-03-23: 35 mg via INTRAVENOUS

## 2014-03-23 MED ORDER — PROMETHAZINE HCL 25 MG/ML IJ SOLN: 6.2500 mg | INTRAMUSCULAR | Status: DC | PRN

## 2014-03-23 MED ORDER — KCL-LACTATED RINGERS-D5W 20 MEQ/L IV SOLN
INTRAVENOUS | Status: DC
  Administered 2014-03-23: 17:00:00 via INTRAVENOUS
  Administered 2014-03-24: 125 mL/h via INTRAVENOUS
  Administered 2014-03-25 – 2014-03-27 (×5): via INTRAVENOUS
  Filled 2014-03-23 (×14): qty 1000

## 2014-03-23 MED ORDER — CEFOTETAN DISODIUM 2 G IJ SOLR
2.0000 g | INTRAMUSCULAR | Status: AC
  Administered 2014-03-23: 2 g via INTRAVENOUS
  Filled 2014-03-23: qty 2

## 2014-03-23 MED ORDER — NEOSTIGMINE METHYLSULFATE 10 MG/10ML IV SOLN
INTRAVENOUS | Status: DC | PRN
  Administered 2014-03-23: 4 mg via INTRAVENOUS

## 2014-03-23 MED ORDER — ONDANSETRON HCL 4 MG/2ML IJ SOLN: 4.0000 mg | Freq: Four times a day (QID) | INTRAMUSCULAR | Status: DC | PRN

## 2014-03-23 MED ORDER — CHLORHEXIDINE GLUCONATE 0.12 % MT SOLN
15.0000 mL | Freq: Two times a day (BID) | OROMUCOSAL | Status: DC
  Administered 2014-03-23 – 2014-03-26 (×5): 15 mL via OROMUCOSAL
  Filled 2014-03-23 (×10): qty 15

## 2014-03-23 MED ORDER — GLYCOPYRROLATE 0.2 MG/ML IJ SOLN
INTRAMUSCULAR | Status: DC | PRN
  Administered 2014-03-23: 0.6 mg via INTRAVENOUS

## 2014-03-23 MED ORDER — CEFOTETAN DISODIUM-DEXTROSE 2-2.08 GM-% IV SOLR
INTRAVENOUS | Status: AC
  Filled 2014-03-23: qty 50

## 2014-03-23 MED ORDER — MIDAZOLAM HCL 5 MG/5ML IJ SOLN
INTRAMUSCULAR | Status: DC | PRN
  Administered 2014-03-23: 2 mg via INTRAVENOUS

## 2014-03-23 MED ORDER — HYDROMORPHONE HCL PF 2 MG/ML IJ SOLN
INTRAMUSCULAR | Status: AC
  Filled 2014-03-23: qty 1

## 2014-03-23 MED ORDER — MIDAZOLAM HCL 2 MG/2ML IJ SOLN
INTRAMUSCULAR | Status: AC
  Filled 2014-03-23: qty 2

## 2014-03-23 MED ORDER — LACTATED RINGERS IV SOLN
INTRAVENOUS | Status: DC
  Administered 2014-03-23: 1000 mL via INTRAVENOUS
  Administered 2014-03-23 (×3): via INTRAVENOUS

## 2014-03-23 MED ORDER — LIDOCAINE HCL (CARDIAC) 20 MG/ML IV SOLN
INTRAVENOUS | Status: AC
  Filled 2014-03-23: qty 5

## 2014-03-23 MED ORDER — BUPIVACAINE HCL (PF) 0.5 % IJ SOLN
INTRAMUSCULAR | Status: AC
  Filled 2014-03-23: qty 30

## 2014-03-23 MED ORDER — DEXAMETHASONE SODIUM PHOSPHATE 10 MG/ML IJ SOLN
INTRAMUSCULAR | Status: DC | PRN
  Administered 2014-03-23: 10 mg via INTRAVENOUS

## 2014-03-23 MED ORDER — FENTANYL CITRATE 0.05 MG/ML IJ SOLN
INTRAMUSCULAR | Status: DC | PRN
  Administered 2014-03-23 (×3): 50 ug via INTRAVENOUS
  Administered 2014-03-23: 100 ug via INTRAVENOUS

## 2014-03-23 MED ORDER — CETYLPYRIDINIUM CHLORIDE 0.05 % MT LIQD
7.0000 mL | Freq: Two times a day (BID) | OROMUCOSAL | Status: DC
  Administered 2014-03-25 – 2014-03-26 (×2): 7 mL via OROMUCOSAL

## 2014-03-23 MED ORDER — BUPIVACAINE HCL (PF) 0.5 % IJ SOLN
INTRAMUSCULAR | Status: DC | PRN
  Administered 2014-03-23: 10 mL

## 2014-03-23 MED ORDER — PROPOFOL 10 MG/ML IV BOLUS
INTRAVENOUS | Status: AC
  Filled 2014-03-23: qty 20

## 2014-03-23 MED ORDER — PANTOPRAZOLE SODIUM 40 MG IV SOLR
40.0000 mg | INTRAVENOUS | Status: DC
  Administered 2014-03-23 – 2014-03-26 (×4): 40 mg via INTRAVENOUS
  Filled 2014-03-23 (×5): qty 40

## 2014-03-23 MED ORDER — NEOSTIGMINE METHYLSULFATE 10 MG/10ML IV SOLN
INTRAVENOUS | Status: AC
Start: 2014-03-23 — End: 2014-03-23
  Filled 2014-03-23: qty 1

## 2014-03-23 MED ORDER — FENTANYL CITRATE 0.05 MG/ML IJ SOLN
INTRAMUSCULAR | Status: AC
  Filled 2014-03-23: qty 5

## 2014-03-23 MED ORDER — PHENYLEPHRINE 40 MCG/ML (10ML) SYRINGE FOR IV PUSH (FOR BLOOD PRESSURE SUPPORT)
PREFILLED_SYRINGE | INTRAVENOUS | Status: AC
  Filled 2014-03-23: qty 10

## 2014-03-23 MED ORDER — ALBUTEROL SULFATE (2.5 MG/3ML) 0.083% IN NEBU
3.0000 mL | INHALATION_SOLUTION | RESPIRATORY_TRACT | Status: DC | PRN
Start: 2014-03-23 — End: 2014-03-28
  Administered 2014-03-24 – 2014-03-28 (×5): 3 mL via RESPIRATORY_TRACT
  Filled 2014-03-23 (×6): qty 3

## 2014-03-23 MED ORDER — HYDROMORPHONE HCL PF 1 MG/ML IJ SOLN
0.2500 mg | INTRAMUSCULAR | Status: DC | PRN
  Administered 2014-03-23 (×3): 0.5 mg via INTRAVENOUS

## 2014-03-23 MED ORDER — HYDROMORPHONE HCL PF 1 MG/ML IJ SOLN
INTRAMUSCULAR | Status: AC
  Filled 2014-03-23: qty 1

## 2014-03-23 MED ORDER — SUCCINYLCHOLINE CHLORIDE 20 MG/ML IJ SOLN
INTRAMUSCULAR | Status: DC | PRN
  Administered 2014-03-23: 100 mg via INTRAVENOUS

## 2014-03-23 MED ORDER — PHENYLEPHRINE HCL 10 MG/ML IJ SOLN
INTRAMUSCULAR | Status: DC | PRN
  Administered 2014-03-23 (×2): 80 ug via INTRAVENOUS

## 2014-03-23 MED ORDER — MORPHINE SULFATE (PF) 1 MG/ML IV SOLN
INTRAVENOUS | Status: AC
  Filled 2014-03-23: qty 25

## 2014-03-23 MED ORDER — ONDANSETRON HCL 4 MG PO TABS: 4.0000 mg | ORAL_TABLET | Freq: Four times a day (QID) | ORAL | Status: DC | PRN

## 2014-03-23 MED ORDER — ALVIMOPAN 12 MG PO CAPS
12.0000 mg | ORAL_CAPSULE | Freq: Two times a day (BID) | ORAL | Status: DC
  Administered 2014-03-24 – 2014-03-26 (×5): 12 mg via ORAL
  Filled 2014-03-23 (×6): qty 1

## 2014-03-23 MED ORDER — HYDROMORPHONE HCL PF 1 MG/ML IJ SOLN
INTRAMUSCULAR | Status: DC | PRN
  Administered 2014-03-23 (×2): 1 mg via INTRAVENOUS

## 2014-03-23 MED ORDER — DEXTROSE 5 % IV SOLN
2.0000 g | Freq: Two times a day (BID) | INTRAVENOUS | Status: AC
  Administered 2014-03-23: 2 g via INTRAVENOUS
  Filled 2014-03-23: qty 2

## 2014-03-23 MED ORDER — SODIUM CHLORIDE 0.9 % IJ SOLN: 9.0000 mL | INTRAMUSCULAR | Status: DC | PRN

## 2014-03-23 MED ORDER — ONDANSETRON HCL 4 MG/2ML IJ SOLN
INTRAMUSCULAR | Status: DC | PRN
  Administered 2014-03-23: 4 mg via INTRAVENOUS

## 2014-03-23 MED ORDER — 0.9 % SODIUM CHLORIDE (POUR BTL) OPTIME
TOPICAL | Status: DC | PRN
  Administered 2014-03-23: 1000 mL

## 2014-03-23 MED ORDER — DIPHENHYDRAMINE HCL 50 MG/ML IJ SOLN
12.5000 mg | Freq: Four times a day (QID) | INTRAMUSCULAR | Status: DC | PRN
  Filled 2014-03-23: qty 1

## 2014-03-23 MED ORDER — ALVIMOPAN 12 MG PO CAPS
12.0000 mg | ORAL_CAPSULE | Freq: Once | ORAL | Status: AC
  Administered 2014-03-23: 12 mg via ORAL
  Filled 2014-03-23: qty 1

## 2014-03-23 MED ORDER — GLYCOPYRROLATE 0.2 MG/ML IJ SOLN
INTRAMUSCULAR | Status: AC
  Filled 2014-03-23: qty 3

## 2014-03-23 MED ORDER — ONDANSETRON HCL 4 MG/2ML IJ SOLN
INTRAMUSCULAR | Status: AC
  Filled 2014-03-23: qty 2

## 2014-03-23 SURGICAL SUPPLY — 82 items
APPLIER CLIP 5 13 M/L LIGAMAX5 (MISCELLANEOUS)
APPLIER CLIP ROT 10 11.4 M/L (STAPLE)
APR CLP MED LRG 11.4X10 (STAPLE)
APR CLP MED LRG 5 ANG JAW (MISCELLANEOUS)
BLADE EXTENDED COATED 6.5IN (ELECTRODE) ×2 IMPLANT
BLADE HEX COATED 2.75 (ELECTRODE) ×2 IMPLANT
BLADE SURG SZ10 CARB STEEL (BLADE) ×3 IMPLANT
CABLE HIGH FREQUENCY MONO STRZ (ELECTRODE) ×3 IMPLANT
CANISTER SUCTION 2500CC (MISCELLANEOUS) ×3 IMPLANT
CELLS DAT CNTRL 66122 CELL SVR (MISCELLANEOUS) IMPLANT
CLIP APPLIE 5 13 M/L LIGAMAX5 (MISCELLANEOUS) IMPLANT
CLIP APPLIE ROT 10 11.4 M/L (STAPLE) IMPLANT
COUNTER NEEDLE 20 DBL MAG RED (NEEDLE) ×3 IMPLANT
COVER MAYO STAND STRL (DRAPES) ×6 IMPLANT
DECANTER SPIKE VIAL GLASS SM (MISCELLANEOUS) ×3 IMPLANT
DISSECTOR BLUNT TIP ENDO 5MM (MISCELLANEOUS) IMPLANT
DRAIN CHANNEL 19F RND (DRAIN) ×2 IMPLANT
DRAPE LAPAROSCOPIC ABDOMINAL (DRAPES) ×3 IMPLANT
DRAPE LG THREE QUARTER DISP (DRAPES) ×3 IMPLANT
DRAPE UTILITY XL STRL (DRAPES) ×6 IMPLANT
DRAPE WARM FLUID 44X44 (DRAPE) ×3 IMPLANT
DRSG OPSITE POSTOP 4X10 (GAUZE/BANDAGES/DRESSINGS) IMPLANT
DRSG OPSITE POSTOP 4X6 (GAUZE/BANDAGES/DRESSINGS) ×2 IMPLANT
DRSG OPSITE POSTOP 4X8 (GAUZE/BANDAGES/DRESSINGS) IMPLANT
ELECT PENCIL ROCKER SW 15FT (MISCELLANEOUS) ×6 IMPLANT
ELECT REM PT RETURN 9FT ADLT (ELECTROSURGICAL) ×3
ELECTRODE REM PT RTRN 9FT ADLT (ELECTROSURGICAL) ×1 IMPLANT
EVACUATOR DRAINAGE 10X20 100CC (DRAIN) IMPLANT
EVACUATOR SILICONE 100CC (DRAIN) ×3 IMPLANT
FILTER SMOKE EVAC LAPAROSHD (FILTER) IMPLANT
GAUZE SPONGE 4X4 12PLY STRL (GAUZE/BANDAGES/DRESSINGS) ×3 IMPLANT
GLOVE ECLIPSE 8.0 STRL XLNG CF (GLOVE) ×6 IMPLANT
GLOVE INDICATOR 8.0 STRL GRN (GLOVE) ×6 IMPLANT
GOWN STRL REUS W/TWL XL LVL3 (GOWN DISPOSABLE) ×12 IMPLANT
KIT BASIN OR (CUSTOM PROCEDURE TRAY) ×3 IMPLANT
LEGGING LITHOTOMY PAIR STRL (DRAPES) ×3 IMPLANT
LIGASURE IMPACT 36 18CM CVD LR (INSTRUMENTS) ×2 IMPLANT
MANIFOLD NEPTUNE II (INSTRUMENTS) ×5 IMPLANT
PENCIL BUTTON HOLSTER BLD 10FT (ELECTRODE) ×2 IMPLANT
PORT LAP GEL ALEXIS MED 5-9CM (MISCELLANEOUS) ×2 IMPLANT
RELOAD PROXIMATE 75MM BLUE (ENDOMECHANICALS) ×6 IMPLANT
RELOAD STAPLE 75 3.8 BLU REG (ENDOMECHANICALS) IMPLANT
RETRACTOR WND ALEXIS 18 MED (MISCELLANEOUS) IMPLANT
RTRCTR WOUND ALEXIS 18CM MED (MISCELLANEOUS)
SCISSORS LAP 5X35 DISP (ENDOMECHANICALS) ×3 IMPLANT
SCISSORS LAP 5X45 EPIX DISP (ENDOMECHANICALS) ×2 IMPLANT
SET IRRIG TUBING LAPAROSCOPIC (IRRIGATION / IRRIGATOR) ×2 IMPLANT
SHEARS HARMONIC ACE PLUS 36CM (ENDOMECHANICALS) IMPLANT
SHEARS HARMONIC ACE PLUS 45CM (MISCELLANEOUS) ×2 IMPLANT
SLEEVE XCEL OPT CAN 5 100 (ENDOMECHANICALS) ×8 IMPLANT
SOLUTION ANTI FOG 6CC (MISCELLANEOUS) ×3 IMPLANT
SPONGE LAP 18X18 X RAY DECT (DISPOSABLE) ×6 IMPLANT
STAPLER CUT CVD 40MM BLUE (STAPLE) ×2 IMPLANT
STAPLER PROXIMATE 75MM BLUE (STAPLE) ×2 IMPLANT
STAPLER VISISTAT 35W (STAPLE) ×3 IMPLANT
SUCTION POOLE TIP (SUCTIONS) ×3 IMPLANT
SUT ETHILON 2 0 PS N (SUTURE) ×2 IMPLANT
SUT MON AB 4-0 PC3 18 (SUTURE) ×2 IMPLANT
SUT PDS AB 1 CTX 36 (SUTURE) IMPLANT
SUT PDS AB 1 TP1 96 (SUTURE) ×2 IMPLANT
SUT PROLENE 2 0 KS (SUTURE) IMPLANT
SUT PROLENE 2 0 SH DA (SUTURE) ×2 IMPLANT
SUT SILK 2 0 (SUTURE) ×3
SUT SILK 2 0 SH CR/8 (SUTURE) ×3 IMPLANT
SUT SILK 2-0 18XBRD TIE 12 (SUTURE) ×1 IMPLANT
SUT SILK 3 0 (SUTURE) ×3
SUT SILK 3 0 SH CR/8 (SUTURE) ×3 IMPLANT
SUT SILK 3-0 18XBRD TIE 12 (SUTURE) ×1 IMPLANT
SUT VICRYL 2 0 18  UND BR (SUTURE) ×2
SUT VICRYL 2 0 18 UND BR (SUTURE) ×1 IMPLANT
SYR BULB IRRIGATION 50ML (SYRINGE) ×3 IMPLANT
SYS LAPSCP GELPORT 120MM (MISCELLANEOUS)
SYSTEM LAPSCP GELPORT 120MM (MISCELLANEOUS) IMPLANT
TOWEL OR 17X26 10 PK STRL BLUE (TOWEL DISPOSABLE) ×6 IMPLANT
TOWEL OR NON WOVEN STRL DISP B (DISPOSABLE) ×6 IMPLANT
TRAY FOLEY CATH 14FRSI W/METER (CATHETERS) ×3 IMPLANT
TRAY LAP CHOLE (CUSTOM PROCEDURE TRAY) ×3 IMPLANT
TROCAR BLADELESS OPT 5 100 (ENDOMECHANICALS) ×5 IMPLANT
TROCAR XCEL BLUNT TIP 100MML (ENDOMECHANICALS) IMPLANT
TROCAR XCEL NON-BLD 11X100MML (ENDOMECHANICALS) IMPLANT
TUBING INSUFFLATION 10FT LAP (TUBING) ×3 IMPLANT
YANKAUER SUCT BULB TIP 10FT TU (MISCELLANEOUS) ×6 IMPLANT

## 2014-03-23 NOTE — Transfer of Care (Signed)
Immediate Anesthesia Transfer of Care Note  Patient: Jerry Montoya  Procedure(s) Performed: Procedure(s): LAPAROSCOPIC ASSISTED sigmoid COLECTOMY with mobilization of spplenic flexure (N/A)  Patient Location: PACU  Anesthesia Type:General  Level of Consciousness: awake, alert  and oriented  Airway & Oxygen Therapy: Patient Spontanous Breathing and Patient connected to face mask oxygen  Post-op Assessment: Report given to PACU RN and Post -op Vital signs reviewed and stable  Post vital signs: Reviewed and stable  Complications: No apparent anesthesia complications

## 2014-03-23 NOTE — Op Note (Signed)
Operative Note  Jerry Montoya male 58 y.o. 03/23/2014  PREOPERATIVE DX:  Sigmoid colon cancer  POSTOPERATIVE DX:  Same  PROCEDURE:  Laparoscopic-assisted partial colectomy (sigmoid colon) with mobilization of splenic flexure.  Rigid proctosigmoidoscopy.         Surgeon: Odis Hollingshead   Assistants: Leighton Ruff, M.D.  Anesthesia: General endotracheal anesthesia  Indications: this is a 58 year old male who recently underwent his first colonoscopy. A polyp in the sigmoid colon was biopsied and was positive for adenocarcinoma. Metastatic workup was negative. He now presents for elective partial colectomy.    Procedure Detail:  He was brought to the operating room placed supine on the operating table and a general anesthetic was given. He was placed in the lithotomy position. The hair in the abdominal wall was clipped. A Foley catheter and oral gastric tube were inserted. The abdominal wall area and perineum area were sterilely prepped and draped.  He was placed in slight reverse Trendelenburg position. A 5 mm left subcostal incision was made. Using a 5 mm laparoscope and Optiview trocar, access was gained into the peritoneal cavity and a pneumoperitoneum was created. The area under the trocar was inspected and there was no evidence of bleeding organ injury. A 5 mm trocar was placed in the supraumbilical region. A 5 mm trocar was placed in the right lower quadrant. A 5 mm trocar was placed in the suprapubic area. A 5 mm trocar was placed in the left lower quadrant.  The lesion had been tattooed and this was identified to be in the mid to proximal sigmoid colon. The sigmoid colon and descending colon were mobilized by dividing the lateral attachments. Using sharp dissection and harmonic scalpel I mobilized the splenic flexure so that it fell down below the level of the umbilicus. I didn't mobilize the distal sigmoid colon. The left ureter was identified and a plane of dissection kept  anterior to this. Once I felt there was adequate mobility, the 5 mm suprapubic trocar was removed and a limited lower midline incision was made through the skin, subcutaneous tissue, fascia, and peritoneum. A wound protection device was placed. The sigmoid colon was exteriorized. The colon was divided at the descending colon sigmoid colon junction with the GIA stapler. Mesentery was divided in a wedge-shaped fashion using the LigaSure. I then divided the colon just inferior to the rectosigmoid junction. The specimen was removed. It was opened up and the back table and the scar from the polypectomy site was noted. Margins appeared grossly negative.  A size 29 EEA stapler was brought into the field. The descending colon staple line was removed and the anvil placed. The descending colon was then resealed with the GIA stapler. The anvil was brought out the side of the descending colon.  S pursestring suture of 2-0 Prolene was placed.  The EEA handle was introduced through the anus end rectum to side descending colon stapled anastomosis was performed. The anastomosis was viable and under no tension.  Two intact donuts were noted. These were sent as the proximal and distal margins. It was patent.  Rigid proctosigmoidoscopy was performed.   Airleak test demonstrated no evidence of a leak. However there did appear to be some bleeding from the 3 to 6:00 aspect of the staple line that was a small amount. Direct pressure was held. Reinspection did not demonstrate any obvious active bleeding from this site.  A #19 Blake drain was placed through the left lower quadrant trocar incision and positioned into the pelvis.  It was anchored to the skin with 3-0 nylon suture. The abdominal cavity was copiously irrigated and there was no evidence of bleeding or organ injury. A limited lower midline incision fascia was then closed with running #1 PDS suture. Repeat laparoscopy was performed. Fascial closure was solid. Four-quadrant and  sent for inspection demonstrated no evidence of bleeding or organ injury. The trocars were removed the pneumoperitoneum was released.  Trocar sites skin incisions were closed with 4-0 Monocryl subcuticular stitches. Limited lower midline skin incision was closed with staples. Sterile dressings were applied.  Drain was hooked up to closed suction.  He tolerated the procedure well without any apparent complications and was taken to the recovery room satisfactory condition.   Estimated Blood Loss:  300 mL         Drains: #19 Blake drain  Blood Given: none          Specimens: sigmoid colon. Proximal and distal margins.        Complications:  * No complications entered in OR log *         Disposition: PACU - hemodynamically stable.         Condition: stable

## 2014-03-23 NOTE — H&P (Signed)
HPI  He was referred by Dr. Earle Gell because of a newly diagnosed sigmoid colon cancer. He underwent his first screening colonoscopy at age 58. A small polyp was seen in the cecum and was benign. A 12 mm polyp was seen in the sigmoid colon. It was removed and was positive for invasive adenocarcinoma invading into at least the submucosa. He underwent a flexible sigmoidoscopy and tattooing of the lesion recently. He is here to discuss further evaluation and treatment. No history of colorectal cancer in the family that he is aware of. He otherwise feels in his normal state of health.      Past Medical History   Diagnosis     .  Asthma     .  Hypercholesterolemia     .  Esophageal reflux           Past Surgical History   Procedure  Laterality  Date   .  Vasectomy           23 years ago         Family History   Problem  Relation  Age of Onset   .  Coronary artery disease  Father      Social History History   Substance Use Topics   .  Smoking status:  Never Smoker    .  Smokeless tobacco:  Never Used   .  Alcohol Use:  No    No Known Allergies    Current Outpatient Prescriptions   Medication  Sig  Dispense  Refill   .  albuterol (PROVENTIL HFA;VENTOLIN HFA) 108 (90 BASE) MCG/ACT inhaler  Inhale 2 puffs into the lungs every 4 (four) hours as needed for wheezing.         [name] Kitchen  esomeprazole (NEXIUM) 40 MG capsule  Take 40 mg by mouth daily before breakfast.         .  Fluticasone Furoate-Vilanterol (BREO ELLIPTA) 100-25 MCG/INH AEPB  Inhale 1 puff into the lungs daily.   1 each   0   .  Fluticasone Furoate-Vilanterol (BREO ELLIPTA) 100-25 MCG/INH AEPB  Inhale 1 puff into the lungs 1 day or 1 dose. Rinse mouth   28 each   prn   .  Fluticasone Furoate-Vilanterol (BREO ELLIPTA) 100-25 MCG/INH AEPB  Inhale 1 puff into the lungs daily.   1 each   0   .  Fluticasone-Salmeterol (ADVAIR) 250-50 MCG/DOSE AEPB  Inhale 1 puff into the lungs every 12 (twelve) hours.         [name] Kitchen  tiotropium  (SPIRIVA) 18 MCG inhalation capsule  Place 1 capsule (18 mcg total) into inhaler and inhale daily.   10 capsule   0     Review of Systems Review of Systems  Constitutional: Negative.   HENT: Negative.   Respiratory: Positive for wheezing (when he has episodes of asthma.).   Cardiovascular: Negative.   Gastrointestinal: Negative.   Genitourinary: Negative.   Musculoskeletal: Negative.   Neurological: Negative.   Hematological: Negative.     Physical Exam  Constitutional: He appears well-developed and well-nourished. No distress.  HENT:   Head: Normocephalic and atraumatic.  Eyes: EOM are normal. No scleral icterus.  Neck: Neck supple.  Cardiovascular: Normal rate and regular rhythm.   Pulmonary/Chest: Effort normal and breath sounds normal.  Abdominal: Soft. He exhibits no distension and no mass. There is no tenderness.  Musculoskeletal: He exhibits no edema.  Lymphadenopathy:    He has no cervical adenopathy.  Neurological: He is  alert.  Skin: Skin is warm and dry.  Psychiatric: He has a normal mood and affect. His behavior is normal.   Assessment    Sigmoid colon cancer. Area of polypectomy has been marked with a tattoo recently.    Plan   Laparoscopic-assisted partial colectomy. I have explained the procedure and risks of colon resection.  Risks include but are not limited to bleeding, infection, wound problems, anesthesia, anastomotic leak, need for colostomy, need for reoperative surgery, injury to intraabominal organs (such as intestine, spleen, kidney, bladder, ureter, etc.), ileus, irregular bowel habits.  He seems to understand and agrees with the plan.     Steffan Caniglia J 03/23/2014

## 2014-03-23 NOTE — Anesthesia Postprocedure Evaluation (Signed)
  Anesthesia Post-op Note  Patient: Jerry Montoya  Procedure(s) Performed: Procedure(s) (LRB): LAPAROSCOPIC ASSISTED sigmoid COLECTOMY with mobilization of spplenic flexure (N/A)  Patient Location: PACU  Anesthesia Type: General  Level of Consciousness: awake and alert   Airway and Oxygen Therapy: Patient Spontanous Breathing  Post-op Pain: mild  Post-op Assessment: Post-op Vital signs reviewed, Patient's Cardiovascular Status Stable, Respiratory Function Stable, Patent Airway and No signs of Nausea or vomiting  Last Vitals:  Filed Vitals:   03/23/14 1430  BP: 143/85  Pulse: 110  Temp: 36.7 C  Resp: 13    Post-op Vital Signs: stable   Complications: No apparent anesthesia complications

## 2014-03-23 NOTE — Interval H&P Note (Signed)
History and Physical Interval Note:  03/23/2014 9:12 AM  Jerry Montoya  has presented today for surgery, with the diagnosis of Colon Cancer  The various methods of treatment have been discussed with the patient and family. After consideration of risks, benefits and other options for treatment, the patient has consented to  Procedure(s): LAPAROSCOPIC ASSISTED PARTIAL COLECTOMY (N/A) as a surgical intervention .  The patient's history has been reviewed, patient examined, no change in status, stable for surgery.  I have reviewed the patient's chart and labs.  Questions were answered to the patient's satisfaction.     [name]

## 2014-03-23 NOTE — Anesthesia Preprocedure Evaluation (Addendum)
Anesthesia Evaluation  Patient identified by MRN, date of birth, ID band Patient awake    Reviewed: Allergy & Precautions, H&P , NPO status , Patient's Chart, lab work & pertinent test results  Airway Mallampati: II TM Distance: >3 FB Neck ROM: Full    Dental  (+)    Pulmonary asthma ,  breath sounds clear to auscultation  Pulmonary exam normal       Cardiovascular negative cardio ROS  Rhythm:Regular Rate:Normal     Neuro/Psych negative neurological ROS  negative psych ROS   GI/Hepatic Neg liver ROS, GERD-  Medicated,  Endo/Other  negative endocrine ROS  Renal/GU negative Renal ROS  negative genitourinary   Musculoskeletal negative musculoskeletal ROS (+)   Abdominal   Peds negative pediatric ROS (+)  Hematology negative hematology ROS (+)   Anesthesia Other Findings   Reproductive/Obstetrics negative OB ROS                          Anesthesia Physical Anesthesia Plan  ASA: II  Anesthesia Plan: General   Post-op Pain Management:    Induction: Intravenous  Airway Management Planned: Oral ETT  Additional Equipment:   Intra-op Plan:   Post-operative Plan: Extubation in OR  Informed Consent: I have reviewed the patients History and Physical, chart, labs and discussed the procedure including the risks, benefits and alternatives for the proposed anesthesia with the patient or authorized representative who has indicated his/her understanding and acceptance.   Dental advisory given  Plan Discussed with: CRNA  Anesthesia Plan Comments: (Right upper lateral incisor is discolored.)       Anesthesia Quick Evaluation

## 2014-03-24 ENCOUNTER — Encounter (HOSPITAL_COMMUNITY): Payer: Self-pay | Admitting: General Surgery

## 2014-03-24 LAB — CBC
HEMATOCRIT: 42.9 % (ref 39.0–52.0)
HEMOGLOBIN: 13.8 g/dL (ref 13.0–17.0)
MCH: 29.3 pg (ref 26.0–34.0)
MCHC: 32.2 g/dL (ref 30.0–36.0)
MCV: 91.1 fL (ref 78.0–100.0)
Platelets: 170 10*3/uL (ref 150–400)
RBC: 4.71 MIL/uL (ref 4.22–5.81)
RDW: 13.1 % (ref 11.5–15.5)
WBC: 10.7 10*3/uL — AB (ref 4.0–10.5)

## 2014-03-24 LAB — BASIC METABOLIC PANEL
Anion gap: 10 (ref 5–15)
BUN: 13 mg/dL (ref 6–23)
CO2: 25 mEq/L (ref 19–32)
CREATININE: 0.7 mg/dL (ref 0.50–1.35)
Calcium: 8.9 mg/dL (ref 8.4–10.5)
Chloride: 102 mEq/L (ref 96–112)
GFR calc Af Amer: >90 mL/min (ref >90)
GFR calc non Af Amer: >90 mL/min (ref >90)
Glucose, Bld: 159 mg/dL — ABNORMAL HIGH (ref 70–99)
Potassium: 5 mEq/L (ref 3.7–5.3)
Sodium: 137 mEq/L (ref 137–147)

## 2014-03-24 LAB — GLUCOSE, CAPILLARY: Glucose-Capillary: 104 mg/dL — ABNORMAL HIGH (ref 70–99)

## 2014-03-24 NOTE — Progress Notes (Signed)
CARE MANAGEMENT NOTE 03/24/2014  Patient:  Jerry Montoya, Jerry Montoya   Account Number:  [id number]  Date Initiated:  03/24/2014  Documentation initiated by:  Lorieann Argueta  Subjective/Objective Assessment:   pt with ca of colon requiring surgical intervention     Action/Plan:   home when stable   Anticipated DC Date:  03/27/2014   Anticipated DC Plan:  HOME/SELF CARE  In-house referral  NA      DC Planning Services  CM consult      Choice offered to / List presented to:             Status of service:  In process, will continue to follow Medicare Important Message given?   (If response is "NO", the following Medicare IM given date fields will be blank) Date Medicare IM given:   Medicare IM given by:   Date Additional Medicare IM given:   Additional Medicare IM given by:    Discharge Disposition:    Per UR Regulation:  Reviewed for med. necessity/level of care/duration of stay  If discussed at [place] of Stay Meetings, dates discussed:    Comments:  Velva Harman, RN, BSN, CCM

## 2014-03-24 NOTE — Progress Notes (Signed)
1 Day Post-Op  Subjective: No nausea this AM.  Pain well controlled with PCA.  Objective: Vital signs in last 24 hours: Temp:  [97.5 F (36.4 C)-98.2 F (36.8 C)] 97.7 F (36.5 C) (09/04 0615) Pulse Rate:  [66-116] 66 (09/04 0615) Resp:  [7-20] 12 (09/04 0729) BP: (93-157)/(57-94) 120/71 mmHg (09/04 0615) SpO2:  [97 %-100 %] 100 % (09/04 0729) Last BM Date: 03/22/14  Intake/Output from previous day: 09/03 0701 - 09/04 0700 In: 4675.8 [P.O.:560; I.V.:4115.8] Out: 2305 [Urine:1875; Drains:280; Blood:150] Intake/Output this shift:    PE: General- In NAD Lungs-clear Abdomen-soft, dressings dry, few bowel sounds, bloody drain output  Lab Results:   Recent Labs  03/24/14 0453  WBC 10.7*  HGB 13.8  HCT 42.9  PLT 170   BMET  Recent Labs  03/24/14 0453  NA 137  K 5.0  CL 102  CO2 25  GLUCOSE 159*  BUN 13  CREATININE 0.70  CALCIUM 8.9   PT/INR No results found for this basename: LABPROT, INR,  in the last 72 hours Comprehensive Metabolic Panel:    Component Value Date/Time   NA 137 03/24/2014 0453   NA 140 03/20/2014 1430   K 5.0 03/24/2014 0453   K 4.2 03/20/2014 1430   CL 102 03/24/2014 0453   CL 102 03/20/2014 1430   CO2 25 03/24/2014 0453   CO2 24 03/20/2014 1430   BUN 13 03/24/2014 0453   BUN 17 03/20/2014 1430   CREATININE 0.70 03/24/2014 0453   CREATININE 0.84 03/20/2014 1430   CREATININE 0.89 02/22/2014 1518   GLUCOSE 159* 03/24/2014 0453   GLUCOSE 94 03/20/2014 1430   CALCIUM 8.9 03/24/2014 0453   CALCIUM 9.5 03/20/2014 1430   AST 23 03/20/2014 1430   AST 17 02/22/2014 1518   ALT 39 03/20/2014 1430   ALT 14 02/22/2014 1518   ALKPHOS 85 03/20/2014 1430   ALKPHOS 69 02/22/2014 1518   BILITOT 0.3 03/20/2014 1430   BILITOT 0.4 02/22/2014 1518   PROT 7.3 03/20/2014 1430   PROT 6.6 02/22/2014 1518   ALBUMIN 4.0 03/20/2014 1430   ALBUMIN 4.3 02/22/2014 1518     Studies/Results: No results found.  Anti-infectives: Anti-infectives   Start     Dose/Rate Route Frequency Ordered  Stop   03/23/14 2200  cefoTEtan (CEFOTAN) 2 g in dextrose 5 % 50 mL IVPB     2 g 100 mL/hr over 30 Minutes Intravenous Every 12 hours 03/23/14 1534 03/23/14 2222   03/23/14 0750  cefoTEtan (CEFOTAN) 2 g in dextrose 5 % 50 mL IVPB     2 g 100 mL/hr over 30 Minutes Intravenous On call to O.R. 03/23/14 0750 03/23/14 0934      Assessment Principal Problem:   Sigmoid colon cancer s/p lap assisted sigmoid colectomy 03/25/14-stable overnight.   Asthma-lungs clear    LOS: 1 day   Plan: Mobilize.  Clear liquids.  Nebulizer treatments prn.   Jerry Montoya 03/24/2014

## 2014-03-25 NOTE — Progress Notes (Addendum)
2 Days Post-Op  Subjective: No BM or flatus.  No nausea.  Walking. Tolerated clear liquids.  Objective: Vital signs in last 24 hours: Temp:  [97.8 F (36.6 C)-98.7 F (37.1 C)] 98.7 F (37.1 C) (09/05 0515) Pulse Rate:  [60-90] 76 (09/05 0515) Resp:  [14-19] 16 (09/05 0515) BP: (113-128)/(63-73) 119/66 mmHg (09/05 0515) SpO2:  [94 %-100 %] 94 % (09/05 0515) Last BM Date: 03/22/14  Intake/Output from previous day: 09/04 0701 - 09/05 0700 In: 4352.1 [P.O.:840; I.V.:3512.1] Out: 3810 [Urine:3725; Drains:85] Intake/Output this shift: Total I/O In: -  Out: 200 [Urine:200]  PE: General- In NAD Abdomen-soft, dressings dry, hypoactive bowel sounds.  Lab Results:   Recent Labs  03/24/14 0453  WBC 10.7*  HGB 13.8  HCT 42.9  PLT 170   BMET  Recent Labs  03/24/14 0453  NA 137  K 5.0  CL 102  CO2 25  GLUCOSE 159*  BUN 13  CREATININE 0.70  CALCIUM 8.9   PT/INR No results found for this basename: LABPROT, INR,  in the last 72 hours Comprehensive Metabolic Panel:    Component Value Date/Time   NA 137 03/24/2014 0453   NA 140 03/20/2014 1430   K 5.0 03/24/2014 0453   K 4.2 03/20/2014 1430   CL 102 03/24/2014 0453   CL 102 03/20/2014 1430   CO2 25 03/24/2014 0453   CO2 24 03/20/2014 1430   BUN 13 03/24/2014 0453   BUN 17 03/20/2014 1430   CREATININE 0.70 03/24/2014 0453   CREATININE 0.84 03/20/2014 1430   CREATININE 0.89 02/22/2014 1518   GLUCOSE 159* 03/24/2014 0453   GLUCOSE 94 03/20/2014 1430   CALCIUM 8.9 03/24/2014 0453   CALCIUM 9.5 03/20/2014 1430   AST 23 03/20/2014 1430   AST 17 02/22/2014 1518   ALT 39 03/20/2014 1430   ALT 14 02/22/2014 1518   ALKPHOS 85 03/20/2014 1430   ALKPHOS 69 02/22/2014 1518   BILITOT 0.3 03/20/2014 1430   BILITOT 0.4 02/22/2014 1518   PROT 7.3 03/20/2014 1430   PROT 6.6 02/22/2014 1518   ALBUMIN 4.0 03/20/2014 1430   ALBUMIN 4.3 02/22/2014 1518     Studies/Results: No results found.  Anti-infectives: Anti-infectives   Start     Dose/Rate Route  Frequency Ordered Stop   03/23/14 2200  cefoTEtan (CEFOTAN) 2 g in dextrose 5 % 50 mL IVPB     2 g 100 mL/hr over 30 Minutes Intravenous Every 12 hours 03/23/14 1534 03/23/14 2222   03/23/14 0750  cefoTEtan (CEFOTAN) 2 g in dextrose 5 % 50 mL IVPB     2 g 100 mL/hr over 30 Minutes Intravenous On call to O.R. 03/23/14 0750 03/23/14 0934      Assessment Principal Problem:   Sigmoid colon cancer s/p lap assisted sigmoid colectomy 03/25/14-progressing well; no bowel function yet.   Asthma-asx   DVT prophylaxis-SCDs; no Heparin given some bleeding from anastomosis intraop.    LOS: 2 days   Plan: Decrease IVF, advance to full liquids.   Jerry Montoya 03/25/2014

## 2014-03-26 MED ORDER — OXYCODONE HCL 5 MG PO TABS
5.0000 mg | ORAL_TABLET | ORAL | Status: DC | PRN
  Administered 2014-03-26 (×4): 5 mg via ORAL
  Administered 2014-03-27: 10 mg via ORAL
  Filled 2014-03-26 (×3): qty 1
  Filled 2014-03-26: qty 2
  Filled 2014-03-26: qty 1

## 2014-03-26 MED ORDER — ZOLPIDEM TARTRATE 5 MG PO TABS: 5.0000 mg | ORAL_TABLET | Freq: Every evening | ORAL | Status: DC | PRN

## 2014-03-26 NOTE — Progress Notes (Signed)
3 Days Post-Op  Subjective: Passing gas and had a BM. Sore.  A little problem with his asthma yesterday, better after nebulizer treatment.  Morphine makes him feel bad.  Objective: Vital signs in last 24 hours: Temp:  [98.3 F (36.8 C)-98.8 F (37.1 C)] 98.3 F (36.8 C) (09/06 0543) Pulse Rate:  [81-94] 94 (09/06 0543) Resp:  [11-18] 16 (09/06 0543) BP: (118-144)/(64-82) 118/76 mmHg (09/06 0543) SpO2:  [91 %-100 %] 92 % (09/06 0543) Last BM Date: 03/22/14  Intake/Output from previous day: 09/05 0701 - 09/06 0700 In: 3338.3 [P.O.:1200; I.V.:2138.3] Out: 1791 [Urine:3150; Drains:95] Intake/Output this shift:    PE: General- In NAD Abdomen-soft, incisions clean and intact, serous drain output  Lab Results:   Recent Labs  03/24/14 0453  WBC 10.7*  HGB 13.8  HCT 42.9  PLT 170   BMET  Recent Labs  03/24/14 0453  NA 137  K 5.0  CL 102  CO2 25  GLUCOSE 159*  BUN 13  CREATININE 0.70  CALCIUM 8.9   PT/INR No results found for this basename: LABPROT, INR,  in the last 72 hours Comprehensive Metabolic Panel:    Component Value Date/Time   NA 137 03/24/2014 0453   NA 140 03/20/2014 1430   K 5.0 03/24/2014 0453   K 4.2 03/20/2014 1430   CL 102 03/24/2014 0453   CL 102 03/20/2014 1430   CO2 25 03/24/2014 0453   CO2 24 03/20/2014 1430   BUN 13 03/24/2014 0453   BUN 17 03/20/2014 1430   CREATININE 0.70 03/24/2014 0453   CREATININE 0.84 03/20/2014 1430   CREATININE 0.89 02/22/2014 1518   GLUCOSE 159* 03/24/2014 0453   GLUCOSE 94 03/20/2014 1430   CALCIUM 8.9 03/24/2014 0453   CALCIUM 9.5 03/20/2014 1430   AST 23 03/20/2014 1430   AST 17 02/22/2014 1518   ALT 39 03/20/2014 1430   ALT 14 02/22/2014 1518   ALKPHOS 85 03/20/2014 1430   ALKPHOS 69 02/22/2014 1518   BILITOT 0.3 03/20/2014 1430   BILITOT 0.4 02/22/2014 1518   PROT 7.3 03/20/2014 1430   PROT 6.6 02/22/2014 1518   ALBUMIN 4.0 03/20/2014 1430   ALBUMIN 4.3 02/22/2014 1518     Studies/Results: No results  found.  Anti-infectives: Anti-infectives   Start     Dose/Rate Route Frequency Ordered Stop   03/23/14 2200  cefoTEtan (CEFOTAN) 2 g in dextrose 5 % 50 mL IVPB     2 g 100 mL/hr over 30 Minutes Intravenous Every 12 hours 03/23/14 1534 03/23/14 2222   03/23/14 0750  cefoTEtan (CEFOTAN) 2 g in dextrose 5 % 50 mL IVPB     2 g 100 mL/hr over 30 Minutes Intravenous On call to O.R. 03/23/14 0750 03/23/14 0934      Assessment Principal Problem:   Sigmoid colon cancer s/p lap assisted sigmoid colectomy 03/25/14-bowel function returning.   Asthma-one episode yesterday controlled with nebulizer   DVT prophylaxis-SCDs; no Heparin given some bleeding from anastomosis intraop.    LOS: 3 days   Plan: Advance diet.  Stop PCA.  Oral analgesic.   Ericson Nafziger J 03/26/2014

## 2014-03-26 NOTE — Progress Notes (Signed)
Pharmacy Brief Note - Alvimopan (Entereg)  The standing order set for alvimopan (Entereg) now includes an automatic order to discontinue the drug after the patient has had a bowel movement.  The change was approved by the [place] and the Medical Executive Committee.    This patient has had a bowel movement documented by nursing.  Therefore, alvimopan has been discontinued.  If there are questions, please contact the pharmacy at [phone].  Thank you-  Kizzie Furnish, PharmD Pager: [phone] 03/26/2014 10:41 AM

## 2014-03-27 MED ORDER — PANTOPRAZOLE SODIUM 40 MG PO TBEC
40.0000 mg | DELAYED_RELEASE_TABLET | Freq: Every day | ORAL | Status: DC
  Administered 2014-03-27 – 2014-03-28 (×2): 40 mg via ORAL
  Filled 2014-03-27 (×2): qty 1

## 2014-03-27 MED ORDER — IBUPROFEN 200 MG PO TABS
600.0000 mg | ORAL_TABLET | Freq: Four times a day (QID) | ORAL | Status: DC | PRN
  Administered 2014-03-27 (×2): 600 mg via ORAL
  Filled 2014-03-27 (×2): qty 3

## 2014-03-27 MED ORDER — HYDROCODONE-ACETAMINOPHEN 5-325 MG PO TABS
1.0000 | ORAL_TABLET | ORAL | Status: DC | PRN
  Administered 2014-03-27 (×3): 2 via ORAL
  Administered 2014-03-28: 1 via ORAL
  Administered 2014-03-28: 2 via ORAL
  Administered 2014-03-28: 1 via ORAL
  Filled 2014-03-27 (×3): qty 2
  Filled 2014-03-27: qty 1
  Filled 2014-03-27 (×2): qty 2

## 2014-03-27 NOTE — Progress Notes (Signed)
4 Days Post-Op  Subjective: Not much of an appetite.  Oxycodone is sedating him and makes him feel bad.  Bowels moving.  Objective: Vital signs in last 24 hours: Temp:  [98 F (36.7 C)-98.7 F (37.1 C)] 98 F (36.7 C) (09/07 0610) Pulse Rate:  [80-101] 81 (09/07 0610) Resp:  [16-18] 18 (09/07 0610) BP: (123-124)/(65-83) 123/83 mmHg (09/07 0610) SpO2:  [95 %] 95 % (09/07 0610) Last BM Date: 03/26/14  Intake/Output from previous day: 09/06 0701 - 09/07 0700 In: 2160 [P.O.:360; I.V.:1800] Out: 520 [Urine:200; Drains:320] Intake/Output this shift:    PE: General- In NAD Abdomen-soft, incisions clean and intact, serous drain output  Lab Results:  No results found for this basename: WBC, HGB, HCT, PLT,  in the last 72 hours BMET No results found for this basename: NA, K, CL, CO2, GLUCOSE, BUN, CREATININE, CALCIUM,  in the last 72 hours PT/INR No results found for this basename: LABPROT, INR,  in the last 72 hours Comprehensive Metabolic Panel:    Component Value Date/Time   NA 137 03/24/2014 0453   NA 140 03/20/2014 1430   K 5.0 03/24/2014 0453   K 4.2 03/20/2014 1430   CL 102 03/24/2014 0453   CL 102 03/20/2014 1430   CO2 25 03/24/2014 0453   CO2 24 03/20/2014 1430   BUN 13 03/24/2014 0453   BUN 17 03/20/2014 1430   CREATININE 0.70 03/24/2014 0453   CREATININE 0.84 03/20/2014 1430   CREATININE 0.89 02/22/2014 1518   GLUCOSE 159* 03/24/2014 0453   GLUCOSE 94 03/20/2014 1430   CALCIUM 8.9 03/24/2014 0453   CALCIUM 9.5 03/20/2014 1430   AST 23 03/20/2014 1430   AST 17 02/22/2014 1518   ALT 39 03/20/2014 1430   ALT 14 02/22/2014 1518   ALKPHOS 85 03/20/2014 1430   ALKPHOS 69 02/22/2014 1518   BILITOT 0.3 03/20/2014 1430   BILITOT 0.4 02/22/2014 1518   PROT 7.3 03/20/2014 1430   PROT 6.6 02/22/2014 1518   ALBUMIN 4.0 03/20/2014 1430   ALBUMIN 4.3 02/22/2014 1518     Studies/Results: No results found.  Anti-infectives: Anti-infectives   Start     Dose/Rate Route Frequency Ordered Stop   03/23/14  2200  cefoTEtan (CEFOTAN) 2 g in dextrose 5 % 50 mL IVPB     2 g 100 mL/hr over 30 Minutes Intravenous Every 12 hours 03/23/14 1534 03/23/14 2222   03/23/14 0750  cefoTEtan (CEFOTAN) 2 g in dextrose 5 % 50 mL IVPB     2 g 100 mL/hr over 30 Minutes Intravenous On call to O.R. 03/23/14 0750 03/23/14 0934      Assessment Principal Problem:   Sigmoid colon cancer s/p lap assisted sigmoid colectomy 03/25/14-bowel function has returned.  Oxy IR is oversedating him.   Asthma-one episode yesterday controlled with nebulizer   DVT prophylaxis-SCDs; no Heparin given some bleeding from anastomosis intraop.    LOS: 4 days   Plan: Advance diet. Stop Oxy IR and switch to Norco.  Add Ibuprofen.  Decrease IVF.   Florella Mcneese J 03/27/2014

## 2014-03-27 NOTE — Progress Notes (Signed)

## 2014-03-27 NOTE — Care Management Note (Addendum)
    Page 1 of 1   03/28/2014     8:58:42 AM CARE MANAGEMENT NOTE 03/28/2014  Patient:  Jerry Montoya, Jerry Montoya   Account Number:  [id number]  Date Initiated:  03/24/2014  Documentation initiated by:  DAVIS,RHONDA  Subjective/Objective Assessment:   pt with ca of colon requiring surgical intervention     Action/Plan:   home when stable   Anticipated DC Date:  03/28/2014   Anticipated DC Plan:  HOME/SELF CARE  In-house referral  NA      DC Planning Services  CM consult      Choice offered to / List presented to:             Status of service:  Completed, signed off Medicare Important Message given?   (If response is "NO", the following Medicare IM given date fields will be blank) Date Medicare IM given:   Medicare IM given by:   Date Additional Medicare IM given:   Additional Medicare IM given by:    Discharge Disposition:  HOME/SELF CARE  Per UR Regulation:  Reviewed for med. necessity/level of care/duration of stay  If discussed at [place] of Stay Meetings, dates discussed:    Comments:  03/28/14 KTHY Alieu Finnigan RN,BSN NCM [phone] D/C [place].  03/27/14 Nicie Milan RN,BSN NCM [phone] POD#4 COLECTOMY, ADVANCING DIET,BOWELS MOVING.NO ANTICIPATED D/C NEEDS.  Velva Harman, RN, BSN, CCM

## 2014-03-28 MED ORDER — HYDROCODONE-ACETAMINOPHEN 5-325 MG PO TABS: 1.0000 | ORAL_TABLET | ORAL | Status: DC | PRN

## 2014-03-28 NOTE — Discharge Instructions (Signed)
[facility], [place] [phone]  OPEN ABDOMINAL SURGERY: POST OP INSTRUCTIONS  Always review your discharge instruction sheet given to you by the facility where your surgery was performed.  IF YOU HAVE DISABILITY OR FAMILY LEAVE FORMS, YOU MUST BRING THEM TO THE OFFICE FOR PROCESSING.  PLEASE DO NOT GIVE THEM TO YOUR DOCTOR.  1. A prescription for pain medication may be given to you upon discharge.  Take your pain medication as prescribed, if needed.  If narcotic pain medicine is not needed, then you may take acetaminophen (Tylenol) or ibuprofen (Advil) as needed. 2. Take your usually prescribed medications unless otherwise directed. 3. If you need a refill on your pain medication, please contact your pharmacy. They will contact our office to request authorization.  Prescriptions will not be filled after 5pm or on week-ends. 4. You should follow a bland, lowfat diet.  Drink plenty of fluids. 5. Most patients will experience some swelling and bruising in the area of the incision. Ice pack will help. Swelling and bruising can take several days to resolve..  6. It is common to experience some constipation if taking pain medication after surgery.  Increasing fluid intake and taking a stool softener will usually help or prevent this problem from occurring.  A mild laxative (Milk of Magnesia or Miralax) should be taken according to package directions if there are no bowel movements after 48 hours. 7.  You may have steri-strips (small skin tapes) in place directly over the incision.  These strips should be left on the skin for 7-10 days.  If your surgeon used skin glue on the incision, you may shower in 24 hours.  The glue will flake off over the next 2-3 weeks.  Any sutures or staples will be removed at the office during your follow-up visit. You may find that a light gauze bandage over your incision may keep your staples from being rubbed or pulled. You may shower and replace the bandage  daily. 8. ACTIVITIES:  You may resume regular (light) daily activities beginning the next day--such as daily self-care, walking, climbing stairs--gradually increasing activities as tolerated.  You may have sexual intercourse when it is comfortable.  Refrain from any heavy lifting or straining until approved by your doctor. a. You may drive when you no longer are taking prescription pain medication, you can comfortably wear a seatbelt, and you can safely maneuver your car and apply brakes b. Return to Work: When released by M.D.___________________________________ 9. You should see your doctor in the office for a follow-up appointment approximately 2-3 weeks after your surgery.  Make sure that you call for this appointment within a day or two after you arrive home to insure a convenient appointment time. OTHER INSTRUCTIONS:  _____________________________________________________________ _____________________________________________________________  WHEN TO CALL YOUR DOCTOR: 1. Fever over 101.5 2. Inability to urinate 3. Nausea and/or vomiting 4. Extreme swelling or bruising 5. Continued bleeding from incision. 6. Increased pain, redness, or drainage from the incision..  The clinic staff is available to answer your questions during regular business hours.  Please dont hesitate to call and ask to speak to one of the nurses if you have concerns.  For further questions, please visit [email or web address]

## 2014-03-28 NOTE — Progress Notes (Signed)
5 Days Post-Op  Subjective: Feels much better since Oxycodone stopped. Objective: Vital signs in last 24 hours: Temp:  [97.8 F (36.6 C)-98.4 F (36.9 C)] 98.3 F (36.8 C) (09/08 0540) Pulse Rate:  [72-96] 72 (09/08 0540) Resp:  [18] 18 (09/08 0540) BP: (111-123)/(67-82) 111/67 mmHg (09/08 0540) SpO2:  [95 %-97 %] 95 % (09/08 0540) Last BM Date: 03/27/14  Intake/Output from previous day: 09/07 0701 - 09/08 0700 In: 720 [P.O.:720] Out: 265 [Drains:265] Intake/Output this shift:    PE: General- In NAD Abdomen-soft, incisions clean and intact, serous drain output  Lab Results:  No results found for this basename: WBC, HGB, HCT, PLT,  in the last 72 hours BMET No results found for this basename: NA, K, CL, CO2, GLUCOSE, BUN, CREATININE, CALCIUM,  in the last 72 hours PT/INR No results found for this basename: LABPROT, INR,  in the last 72 hours Comprehensive Metabolic Panel:    Component Value Date/Time   NA 137 03/24/2014 0453   NA 140 03/20/2014 1430   K 5.0 03/24/2014 0453   K 4.2 03/20/2014 1430   CL 102 03/24/2014 0453   CL 102 03/20/2014 1430   CO2 25 03/24/2014 0453   CO2 24 03/20/2014 1430   BUN 13 03/24/2014 0453   BUN 17 03/20/2014 1430   CREATININE 0.70 03/24/2014 0453   CREATININE 0.84 03/20/2014 1430   CREATININE 0.89 02/22/2014 1518   GLUCOSE 159* 03/24/2014 0453   GLUCOSE 94 03/20/2014 1430   CALCIUM 8.9 03/24/2014 0453   CALCIUM 9.5 03/20/2014 1430   AST 23 03/20/2014 1430   AST 17 02/22/2014 1518   ALT 39 03/20/2014 1430   ALT 14 02/22/2014 1518   ALKPHOS 85 03/20/2014 1430   ALKPHOS 69 02/22/2014 1518   BILITOT 0.3 03/20/2014 1430   BILITOT 0.4 02/22/2014 1518   PROT 7.3 03/20/2014 1430   PROT 6.6 02/22/2014 1518   ALBUMIN 4.0 03/20/2014 1430   ALBUMIN 4.3 02/22/2014 1518     Studies/Results: No results found.  Anti-infectives: Anti-infectives   Start     Dose/Rate Route Frequency Ordered Stop   03/23/14 2200  cefoTEtan (CEFOTAN) 2 g in dextrose 5 % 50 mL IVPB     2 g 100  mL/hr over 30 Minutes Intravenous Every 12 hours 03/23/14 1534 03/23/14 2222   03/23/14 0750  cefoTEtan (CEFOTAN) 2 g in dextrose 5 % 50 mL IVPB     2 g 100 mL/hr over 30 Minutes Intravenous On call to O.R. 03/23/14 0750 03/23/14 0934      Assessment Principal Problem:   Sigmoid colon cancer s/p lap assisted sigmoid colectomy 03/25/14-bowel function has returned.  Doing much better the past 24 hours.  Pathology not back yet.   Asthma-controlled with intermittent neb txs.   DVT prophylaxis-SCDs; no Heparin given some bleeding from anastomosis intraop.    LOS: 5 days   Plan: Remove drain-done.  Remove staples-done.  Discharge today.  Instructions given to him.   Genni Buske J 03/28/2014

## 2014-03-28 NOTE — Progress Notes (Signed)
Discharge and script for pain given to patient

## 2014-03-28 NOTE — Discharge Summary (Signed)
Physician Discharge Summary  Patient ID: Jerry Montoya MRN: [id number] DOB/AGE: 60-Nov-1957 60 y.o.  Admit date: 03/23/2014 Discharge date: 03/28/2014  Admission Diagnoses:  Sigmoid colon cancer  Discharge Diagnoses:  Principal Problem:   Sigmoid colon cancer s/p lap assisted sigmoid colectomy 03/23/14   Asthma   Discharged Condition: good  Hospital Course: He underwent laparoscopic assisted sigmoid colectomy on 03/23/14.  He had a mild postop ileus that slowly resolved.  His asthma was treated with prn nebulizer treatments.  He had dysphoria from morphine and oxycodone which depressed his appetite and energy level.  Once these were stopped, he showed marked improvement in 24 hours and was ready to be discharged on POD #5.  Final pathology is pending.  Discharge instructions were given to him.   Consults: None  Significant Diagnostic Studies: none  Treatments: surgery: Laparoscopic assisted sigmoid colectomy.  Discharge Exam: Blood pressure 111/67, pulse 72, temperature 98.3 F (36.8 C), temperature source Oral, resp. rate 18, height 5\' 10"  (1.778 m), weight 170 lb (77.111 kg), SpO2 95.00%.   Disposition: Discharge to home.     Medication List         albuterol 108 (90 BASE) MCG/ACT inhaler  Commonly known as:  PROVENTIL HFA;VENTOLIN HFA  Inhale 2 puffs into the lungs every 4 (four) hours as needed for wheezing.     esomeprazole 40 MG capsule  Commonly known as:  NEXIUM  Take 40 mg by mouth daily before breakfast.     HYDROcodone-acetaminophen 5-325 MG per tablet  Commonly known as:  NORCO/VICODIN  Take 1-2 tablets by mouth every 4 (four) hours as needed for moderate pain.         Signed: Odis Hollingshead 03/28/2014, 8:41 AM

## 2014-03-29 ENCOUNTER — Encounter (INDEPENDENT_AMBULATORY_CARE_PROVIDER_SITE_OTHER): Payer: Self-pay | Admitting: General Surgery

## 2014-03-29 NOTE — Progress Notes (Signed)
Patient ID: Jerry Montoya, male   DOB: 1955/09/08, 58 y.o.   MRN: [id number] Pathology came back demonstrating no residual carcinoma. Polypectomy site was identified.  15 lymph nodes were negative for tumor. This makes him stage I. This was discussed with him.

## 2014-04-30 ENCOUNTER — Encounter (HOSPITAL_COMMUNITY)
Admission: EM | Disposition: A | Discharge status: Home / Self Care | Payer: BC Managed Care – PPO | Source: Home / Self Care | Attending: Internal Medicine

## 2014-04-30 ENCOUNTER — Inpatient Hospital Stay (HOSPITAL_COMMUNITY)
Admission: EM | Admit: 2014-04-30 | Discharge: 2014-05-02 | DRG: 246 | Disposition: A | Discharge status: Home / Self Care | Payer: BC Managed Care – PPO | Attending: Internal Medicine | Admitting: Internal Medicine

## 2014-04-30 ENCOUNTER — Encounter (HOSPITAL_COMMUNITY): Payer: Self-pay | Admitting: Emergency Medicine

## 2014-04-30 DIAGNOSIS — I2109 ST elevation (STEMI) myocardial infarction involving other coronary artery of anterior wall: Principal | ICD-10-CM | POA: Diagnosis present

## 2014-04-30 DIAGNOSIS — I255 Ischemic cardiomyopathy: Secondary | ICD-10-CM | POA: Diagnosis present

## 2014-04-30 DIAGNOSIS — I462 Cardiac arrest due to underlying cardiac condition: Secondary | ICD-10-CM | POA: Diagnosis present

## 2014-04-30 DIAGNOSIS — E876 Hypokalemia: Secondary | ICD-10-CM | POA: Diagnosis present

## 2014-04-30 DIAGNOSIS — Z85038 Personal history of other malignant neoplasm of large intestine: Secondary | ICD-10-CM

## 2014-04-30 DIAGNOSIS — I213 ST elevation (STEMI) myocardial infarction of unspecified site: Secondary | ICD-10-CM | POA: Diagnosis present

## 2014-04-30 DIAGNOSIS — K219 Gastro-esophageal reflux disease without esophagitis: Secondary | ICD-10-CM | POA: Diagnosis present

## 2014-04-30 DIAGNOSIS — J45909 Unspecified asthma, uncomplicated: Secondary | ICD-10-CM | POA: Diagnosis present

## 2014-04-30 DIAGNOSIS — E780 Pure hypercholesterolemia: Secondary | ICD-10-CM | POA: Diagnosis present

## 2014-04-30 DIAGNOSIS — I2582 Chronic total occlusion of coronary artery: Secondary | ICD-10-CM | POA: Diagnosis present

## 2014-04-30 DIAGNOSIS — Z955 Presence of coronary angioplasty implant and graft: Secondary | ICD-10-CM

## 2014-04-30 DIAGNOSIS — C189 Malignant neoplasm of colon, unspecified: Secondary | ICD-10-CM

## 2014-04-30 DIAGNOSIS — I2102 ST elevation (STEMI) myocardial infarction involving left anterior descending coronary artery: Secondary | ICD-10-CM

## 2014-04-30 DIAGNOSIS — I2510 Atherosclerotic heart disease of native coronary artery without angina pectoris: Secondary | ICD-10-CM

## 2014-04-30 DIAGNOSIS — I4901 Ventricular fibrillation: Secondary | ICD-10-CM | POA: Diagnosis present

## 2014-04-30 DIAGNOSIS — J45998 Other asthma: Secondary | ICD-10-CM

## 2014-04-30 HISTORY — PX: OTHER SURGICAL HISTORY: SHX169

## 2014-04-30 HISTORY — PX: LEFT HEART CATHETERIZATION WITH CORONARY ANGIOGRAM: SHX5451

## 2014-04-30 LAB — COMPREHENSIVE METABOLIC PANEL WITH GFR
ALT: 17 U/L (ref 0–53)
AST: 19 U/L (ref 0–37)
Albumin: 4.2 g/dL (ref 3.5–5.2)
Alkaline Phosphatase: 84 U/L (ref 39–117)
Anion gap: 14 (ref 5–15)
BUN: 15 mg/dL (ref 6–23)
CO2: 23 meq/L (ref 19–32)
Calcium: 9.2 mg/dL (ref 8.4–10.5)
Chloride: 101 meq/L (ref 96–112)
Creatinine, Ser: 0.81 mg/dL (ref 0.50–1.35)
GFR calc Af Amer: >90 mL/min
GFR calc non Af Amer: >90 mL/min
Glucose, Bld: 121 mg/dL — ABNORMAL HIGH (ref 70–99)
Potassium: 3.6 meq/L — ABNORMAL LOW (ref 3.7–5.3)
Sodium: 138 meq/L (ref 137–147)
Total Bilirubin: 0.4 mg/dL (ref 0.3–1.2)
Total Protein: 7.9 g/dL (ref 6.0–8.3)

## 2014-04-30 LAB — I-STAT TROPONIN, ED: TROPONIN I, POC: 0.05 ng/mL (ref 0.00–0.08)

## 2014-04-30 LAB — CBC
HCT: 46.7 % (ref 39.0–52.0)
Hemoglobin: 16 g/dL (ref 13.0–17.0)
MCH: 30.7 pg (ref 26.0–34.0)
MCHC: 34.3 g/dL (ref 30.0–36.0)
MCV: 89.6 fL (ref 78.0–100.0)
Platelets: 178 10*3/uL (ref 150–400)
RBC: 5.21 MIL/uL (ref 4.22–5.81)
RDW: 13 % (ref 11.5–15.5)
WBC: 7.1 10*3/uL (ref 4.0–10.5)

## 2014-04-30 LAB — TROPONIN I
Troponin I: 10.99 ng/mL (ref <0.30)
Troponin I: 16.71 ng/mL (ref <0.30)

## 2014-04-30 LAB — PROTIME-INR
INR: 1.02 (ref 0.00–1.49)
Prothrombin Time: 13.4 s (ref 11.6–15.2)

## 2014-04-30 LAB — APTT: aPTT: 32 seconds (ref 24–37)

## 2014-04-30 LAB — MRSA PCR SCREENING: MRSA BY PCR: NEGATIVE

## 2014-04-30 SURGERY — LEFT HEART CATHETERIZATION WITH CORONARY ANGIOGRAM

## 2014-04-30 MED ORDER — PANTOPRAZOLE SODIUM 40 MG PO TBEC
40.0000 mg | DELAYED_RELEASE_TABLET | Freq: Every day | ORAL | Status: DC
  Administered 2014-04-30 – 2014-05-02 (×3): 40 mg via ORAL
  Filled 2014-04-30 (×3): qty 1

## 2014-04-30 MED ORDER — ONDANSETRON HCL 4 MG/2ML IJ SOLN
4.0000 mg | Freq: Once | INTRAMUSCULAR | Status: DC
  Filled 2014-04-30: qty 2

## 2014-04-30 MED ORDER — BIVALIRUDIN 250 MG IV SOLR
INTRAVENOUS | Status: AC
  Filled 2014-04-30: qty 250

## 2014-04-30 MED ORDER — ONDANSETRON HCL 4 MG/2ML IJ SOLN: 4.0000 mg | Freq: Four times a day (QID) | INTRAMUSCULAR | Status: DC | PRN

## 2014-04-30 MED ORDER — ALBUTEROL SULFATE HFA 108 (90 BASE) MCG/ACT IN AERS: 2.0000 | INHALATION_SPRAY | RESPIRATORY_TRACT | Status: DC | PRN

## 2014-04-30 MED ORDER — SODIUM CHLORIDE 0.9 % IV SOLN: INTRAVENOUS | Status: AC

## 2014-04-30 MED ORDER — ASPIRIN EC 81 MG PO TBEC
81.0000 mg | DELAYED_RELEASE_TABLET | Freq: Every day | ORAL | Status: DC
  Administered 2014-05-01 – 2014-05-02 (×2): 81 mg via ORAL
  Filled 2014-04-30 (×2): qty 1

## 2014-04-30 MED ORDER — HEPARIN SODIUM (PORCINE) 5000 UNIT/ML IJ SOLN
INTRAMUSCULAR | Status: AC
  Administered 2014-04-30: 4000 [IU] via INTRAVENOUS
  Filled 2014-04-30: qty 1

## 2014-04-30 MED ORDER — ALBUTEROL SULFATE (2.5 MG/3ML) 0.083% IN NEBU: 2.5000 mg | INHALATION_SOLUTION | Freq: Four times a day (QID) | RESPIRATORY_TRACT | Status: DC | PRN

## 2014-04-30 MED ORDER — PRASUGREL HCL 10 MG PO TABS
ORAL_TABLET | ORAL | Status: AC
  Filled 2014-04-30: qty 5

## 2014-04-30 MED ORDER — ATORVASTATIN CALCIUM 80 MG PO TABS
80.0000 mg | ORAL_TABLET | Freq: Every day | ORAL | Status: DC
  Administered 2014-04-30 – 2014-05-01 (×2): 80 mg via ORAL
  Filled 2014-04-30 (×4): qty 1

## 2014-04-30 MED ORDER — LISINOPRIL 2.5 MG PO TABS
2.5000 mg | ORAL_TABLET | Freq: Every day | ORAL | Status: DC
Start: 2014-04-30 — End: 2014-05-02
  Administered 2014-04-30 – 2014-05-02 (×3): 2.5 mg via ORAL
  Filled 2014-04-30 (×3): qty 1

## 2014-04-30 MED ORDER — ASPIRIN 81 MG PO CHEW: 81.0000 mg | CHEWABLE_TABLET | Freq: Every day | ORAL | Status: DC

## 2014-04-30 MED ORDER — HEPARIN SODIUM (PORCINE) 5000 UNIT/ML IJ SOLN
4000.0000 [IU] | INTRAMUSCULAR | Status: AC
  Administered 2014-04-30: 4000 [IU] via INTRAVENOUS

## 2014-04-30 MED ORDER — HEPARIN (PORCINE) IN NACL 2-0.9 UNIT/ML-% IJ SOLN
INTRAMUSCULAR | Status: AC
  Filled 2014-04-30: qty 1000

## 2014-04-30 MED ORDER — ASPIRIN 81 MG PO CHEW
324.0000 mg | CHEWABLE_TABLET | Freq: Once | ORAL | Status: AC
  Administered 2014-04-30: 324 mg via ORAL

## 2014-04-30 MED ORDER — NITROGLYCERIN 1 MG/10 ML FOR IR/CATH LAB
INTRA_ARTERIAL | Status: AC
  Filled 2014-04-30: qty 10

## 2014-04-30 MED ORDER — CARVEDILOL 6.25 MG PO TABS
6.2500 mg | ORAL_TABLET | Freq: Two times a day (BID) | ORAL | Status: DC
  Administered 2014-04-30 – 2014-05-02 (×4): 6.25 mg via ORAL
  Filled 2014-04-30 (×6): qty 1

## 2014-04-30 MED ORDER — PRASUGREL HCL 10 MG PO TABS
10.0000 mg | ORAL_TABLET | Freq: Every day | ORAL | Status: DC
  Administered 2014-05-01 – 2014-05-02 (×2): 10 mg via ORAL
  Filled 2014-04-30 (×2): qty 1

## 2014-04-30 MED ORDER — ACETAMINOPHEN 325 MG PO TABS: 650.0000 mg | ORAL_TABLET | ORAL | Status: DC | PRN

## 2014-04-30 MED ORDER — METOPROLOL TARTRATE 25 MG PO TABS: 25.0000 mg | ORAL_TABLET | Freq: Two times a day (BID) | ORAL | Status: DC

## 2014-04-30 MED ORDER — PRASUGREL HCL 10 MG PO TABS
ORAL_TABLET | ORAL | Status: AC
  Filled 2014-04-30: qty 1

## 2014-04-30 MED ORDER — SODIUM CHLORIDE 0.9 % IV SOLN
0.2500 mg/kg/h | INTRAVENOUS | Status: AC
  Filled 2014-04-30 (×2): qty 250

## 2014-04-30 MED ORDER — ATORVASTATIN CALCIUM 80 MG PO TABS: 80.0000 mg | ORAL_TABLET | Freq: Every day | ORAL | Status: DC

## 2014-04-30 MED ORDER — AMIODARONE HCL IN DEXTROSE 360-4.14 MG/200ML-% IV SOLN: 30.0000 mg/h | INTRAVENOUS | Status: AC

## 2014-04-30 MED ORDER — AMIODARONE HCL IN DEXTROSE 360-4.14 MG/200ML-% IV SOLN
60.0000 mg/h | INTRAVENOUS | Status: AC
  Filled 2014-04-30: qty 200

## 2014-04-30 MED ORDER — DEXTROSE 5 % IV SOLN
300.0000 mg | INTRAVENOUS | Status: AC | PRN
  Administered 2014-04-30: 300 mg via INTRAVENOUS

## 2014-04-30 MED ORDER — ASPIRIN 81 MG PO CHEW: 324.0000 mg | CHEWABLE_TABLET | ORAL | Status: DC

## 2014-04-30 MED ORDER — LIDOCAINE HCL (PF) 1 % IJ SOLN
INTRAMUSCULAR | Status: AC
  Filled 2014-04-30: qty 30

## 2014-04-30 MED ORDER — SODIUM CHLORIDE 0.9 % IV SOLN: INTRAVENOUS | Status: DC

## 2014-04-30 MED ORDER — ASPIRIN 300 MG RE SUPP
300.0000 mg | RECTAL | Status: DC
  Filled 2014-04-30: qty 1

## 2014-04-30 MED ORDER — ASPIRIN 81 MG PO CHEW
CHEWABLE_TABLET | ORAL | Status: AC
  Administered 2014-04-30: 324 mg via ORAL
  Filled 2014-04-30: qty 1

## 2014-04-30 MED ORDER — VERAPAMIL HCL 2.5 MG/ML IV SOLN
INTRAVENOUS | Status: AC
  Filled 2014-04-30: qty 2

## 2014-04-30 MED ORDER — NITROGLYCERIN 0.4 MG SL SUBL: 0.4000 mg | SUBLINGUAL_TABLET | SUBLINGUAL | Status: DC | PRN

## 2014-04-30 NOTE — Progress Notes (Signed)
Utilization review completed.  

## 2014-04-30 NOTE — ED Notes (Signed)
Pt states"i dont feel well". Pt was noted to be in v. Fib. Dr Wyvonnia Dusky called to room. pt became unresponsive with snoring respirations. Pt shocked with 200J for Vfib with dr Wyvonnia Dusky at bedside.  Pt became responsive after immediately after. Pt to alert and oriented after event. stemi team at bedside.

## 2014-04-30 NOTE — Progress Notes (Signed)
Responded to code stemi to support pt's wife and daughter while pt was in cath lab. Pt's daughter was tearful and wife was visibly concerned. Had prayer w/pt's family and continued to offer support through presence and acting as liaison while pt was in cath lab. Introduced Dr. Fletcher Anon to family post stemi and escorted family to heart unit waiting area after pt status update. Will forward information to on-coming Chaplain for continued support as needed. Ernest Haber Chaplain  04/30/14 1500  Clinical Encounter Type  Visited With Family

## 2014-04-30 NOTE — Progress Notes (Signed)
Per Jory Sims, amiodarone not to be started, but MD to be notified for any dysrhythmias.

## 2014-04-30 NOTE — H&P (Signed)
    Primary Physician:  [place]  Primary Cardiologist:  New   HPI:  Patient presents to ER with CP No prior cardiac history    History of asthma  Was having some increased wheezing this AM  Took inhaler  HR went up  This AM around 10 developed SSCP  8/10 at worst.  Not pleuritic  Arm numb   EMS called. In ER  EKG with ST elevation anteriorly.  Currently pain 5/10    Had hemicolectomy 6 wks ago (right) for polyp     Past Medical History  Diagnosis Date  . Asthma   . Hypercholesterolemia   . Esophageal reflux   . Cancer     colon cancer     (Not in a hospital admission)   . aspirin  324 mg Oral Once  . heparin      . heparin  4,000 Units Intravenous STAT    Infusions: . sodium chloride      No Known Allergies  History   Social History  . Marital Status: Married    Spouse Name: N/A    Number of Children: 2  . Years of Education: N/A   Occupational History  . computer tech    Social History Main Topics  . Smoking status: Never Smoker   . Smokeless tobacco: Never Used  . Alcohol Use: No  . Drug Use: No  . Sexual Activity: Not on file   Other Topics Concern  . Not on file   Social History Narrative  . No narrative on file    Family History  Problem Relation Age of Onset  . Coronary artery disease Father     REVIEW OF SYSTEMS:  All systems reviewed  Negative to the above problem except as noted above.    PHYSICAL EXAM: Filed Vitals:   04/30/14 1105  BP: 155/90  Pulse: 75  Temp: 98.1 F (36.7 C)  Resp: 16    No intake or output data in the 24 hours ending 04/30/14 1116  General: Patient is in NAD  Complaining of CP 5/10   HEENT: normal Neck: supple. no JVD. Carotids 2+ bilat; no bruits. No lymphadenopathy or thryomegaly appreciated. Cor: PMI nondisplaced. Regular rate & rhythm. No rubs, gallops or murmurs. Lungs: clear Abdomen: soft, nontender, nondistended. No hepatosplenomegaly. No bruits or masses. Good bowel sounds. Extremities: no  cyanosis, clubbing, rash, edema Neuro: alert & oriented x 3, cranial nerves grossly intact. moves all 4 extremities w/o difficulty. Affect pleasant.  ECG:  SR 97  ST elevation 2 to 6 mm V3 to V5.  1 mm ST  elevation I, AVL  No results found for this or any previous visit (from the past 24 hour(s)). No results found.   ASSESSMENT: Patient is a 58 yo with no prior cardiac history  PResents with CP since about 10-10:15 AM  EKG with ST elevation anteriorly.  While in ER developed VF  Shocked x 1 with restoration of SR  AMio bolus 300 given and amio gtt started.  Plan for cath with intervention  2.  Asthma  Breathing well now.  ? If symptoms were more cardiac in origin earlier  Clear now.  Follow   3.  Lipids  Start 80 lipitor.

## 2014-04-30 NOTE — ED Provider Notes (Signed)
CSN: [id number]     Arrival date & time 04/30/14  1102 History   First MD Initiated Contact with Patient 04/30/14 1112     Chief Complaint  Patient presents with  . Code STEMI     (Consider location/radiation/quality/duration/timing/severity/associated sxs/prior Treatment) HPI Comments: Patient with history of asthma presents with one hour history of left-sided chest pain and shortness of breath. EKG shows anterior STEMI. History limited by acuity if condition. Patient reports no cardiac history. Recently had partial colectomy on September 3 for polyp. Only other medical history of asthma. No allergies. Pain is sharp, stabbing, constant radiating to left arm.  The history is provided by the patient and a relative. The history is limited by the condition of the patient.    Past Medical History  Diagnosis Date  . Asthma   . Hypercholesterolemia   . Esophageal reflux   . Cancer     colon cancer   Past Surgical History  Procedure Laterality Date  . Vasectomy      23 years ago  . Laparoscopic partial colectomy N/A 03/23/2014    Procedure: LAPAROSCOPIC ASSISTED sigmoid COLECTOMY with mobilization of spplenic flexure;  Surgeon: Jackolyn Confer, MD;  Location: [facility];  Service: General;  Laterality: N/A;   Family History  Problem Relation Age of Onset  . Coronary artery disease Father    History  Substance Use Topics  . Smoking status: Never Smoker   . Smokeless tobacco: Never Used  . Alcohol Use: 0.6 oz/week    1 Glasses of wine per week    Review of Systems  Unable to perform ROS: Acuity of condition      Allergies  Review of patient's allergies indicates no known allergies.  Home Medications   Prior to Admission medications   Medication Sig Start Date End Date Taking? Authorizing Provider  acetaminophen (TYLENOL) 500 MG tablet Take 1,000 mg by mouth every 6 (six) hours as needed for mild pain.   Yes Historical Provider, MD  albuterol (PROVENTIL HFA;VENTOLIN HFA)  108 (90 BASE) MCG/ACT inhaler Inhale 2 puffs into the lungs every 4 (four) hours as needed for wheezing.   Yes Historical Provider, MD   BP 133/89  Pulse 83  Temp(Src) 98.1 F (36.7 C) (Oral)  Resp 14  Ht 5\' 10"  (1.778 m)  Wt 172 lb 6.4 oz (78.2 kg)  BMI 24.74 kg/m2  SpO2 96% Physical Exam  Nursing note and vitals reviewed. Constitutional: He is oriented to person, place, and time. He appears well-developed and well-nourished. No distress.  HENT:  Head: Normocephalic and atraumatic.  Mouth/Throat: Oropharynx is clear and moist. No oropharyngeal exudate.  Eyes: Conjunctivae and EOM are normal. Pupils are equal, round, and reactive to light.  Neck: Normal range of motion. Neck supple.  No meningismus.  Cardiovascular: Normal rate, regular rhythm, normal heart sounds and intact distal pulses.   No murmur heard. Pulmonary/Chest: Effort normal and breath sounds normal. No respiratory distress.  Abdominal: Soft. There is no tenderness. There is no rebound and no guarding.  Musculoskeletal: Normal range of motion. He exhibits no edema and no tenderness.  Neurological: He is alert and oriented to person, place, and time. No cranial nerve deficit. He exhibits normal muscle tone. Coordination normal.  No ataxia on finger to nose bilaterally. No pronator drift. 5/5 strength throughout. CN 2-12 intact. Equal grip strength. Sensation intact.  Skin: Skin is warm.  Psychiatric: He has a normal mood and affect. His behavior is normal.    ED Course  Procedures (including critical care time) Labs Review Labs Reviewed  COMPREHENSIVE METABOLIC PANEL - Abnormal; Notable for the following:    Potassium 3.6 (*)    Glucose, Bld 121 (*)    All other components within normal limits  TROPONIN I - Abnormal; Notable for the following:    Troponin I 10.99 (*)    All other components within normal limits  MRSA PCR SCREENING  APTT  CBC  PROTIME-INR  TROPONIN I  TROPONIN I  BASIC METABOLIC PANEL   LIPID PANEL  CBC  I-STAT TROPOININ, ED    Imaging Review No results found.   EKG Interpretation   Date/Time:  Sunday April 30 2014 11:25:28 EDT Ventricular Rate:  97 PR Interval:  145 QRS Duration: 85 QT Interval:  365 QTC Calculation: 464 R Axis:   48 Text Interpretation:  Sinus rhythm Anterior infarct, acute (LAD) Lateral  leads are also involved Baseline wander in lead(s) I II III aVR aVF V2 V4  V5 V6 anterior STEMI Confirmed by Wyvonnia Dusky  MD, Mena Lienau ([id number]) on  04/30/2014 11:35:47 AM      MDM   Final diagnoses:  ST elevation myocardial infarction involving left anterior descending (LAD) coronary artery  Ventricular fibrillation   Chest and shortness of breath, EKG consistent with STEMI.  Aspirin, heparin, labs, emergent catheterization  Patient with agonal respirations and V. fib arrest in the ED. Received defibrillation x1 200 J. Immediate return of circulation and consciousness. Amiodarone bolus and drip started.  Dr. Harrington Challenger at bedside. Patient expedited to the Cath Lab. Airway stable.  Cardiopulmonary Resuscitation (CPR) Procedure Note Directed/Performed by: Ezequiel Essex I personally directed ancillary staff and/or performed CPR in an effort to regain return of spontaneous circulation and to maintain cardiac, neuro and systemic perfusion.    CRITICAL CARE Performed by: Ezequiel Essex Total critical care time: 40 Critical care time was exclusive of separately billable procedures and treating other patients. Critical care was necessary to treat or prevent imminent or life-threatening deterioration. Critical care was time spent personally by me on the following activities: development of treatment plan with patient and/or surrogate as well as nursing, discussions with consultants, evaluation of patient's response to treatment, examination of patient, obtaining history from patient or surrogate, ordering and performing treatments and interventions, ordering  and review of laboratory studies, ordering and review of radiographic studies, pulse oximetry and re-evaluation of patient's condition.   Ezequiel Essex, MD 04/30/14 [id number]

## 2014-04-30 NOTE — ED Notes (Signed)
Pt states that he began having chest pain at 1030 today. Pt states that he has asthma and did not notice any SOB.

## 2014-04-30 NOTE — ED Notes (Addendum)
300mg  of amiodarone was given IV push per dr rancours order from [facility]. Given by wendy rn

## 2014-04-30 NOTE — CV Procedure (Signed)
Cardiac Catheterization Procedure Note  Name: CAMILLO QUADROS MRN: [id number] DOB: 11-18-1955  Procedure: Left Heart Cath, Selective Coronary Angiography, LV angiography, PTCA and stenting of the mid LAD  Indication: Acute anterior ST elevation myocardial infarction. The patient had ventricular fibrillation and was shocked once in the emergency room with restoration of normal rhythm. He did not require CPR or intubation.  Medications:  Sedation:  None   Contrast:  110 mL Omnipaque  Procedural Details: The right wrist was prepped, draped, and anesthetized with 1% lidocaine. Using the modified Seldinger technique, a 5 French Slender sheath was introduced into the right radial artery. 3 mg of verapamil was administered through the sheath, weight-based bivalirudin was administered intravenously. An Ikari left 3.5 was used for selective coronary angiography. A pigtail catheter was used for left ventriculography. Catheter exchanges were performed over an exchange length guidewire. There were no immediate procedural complications.  Procedural Findings:  Hemodynamics: AO:  117/74   mmHg LV:  117/12    mmHg LVEDP: 21  mmHg  Coronary angiography: Coronary dominance: Right   Left Main:  10-20% distal stenosis.  Left Anterior Descending (LAD):  Normal in size with 40% proximal stenosis. There is a discrete 30% mid stenosis just after the first septal perforator. The vessel is occluded in the midsegment with TIMI 1 flow.  1st diagonal (D1):  Large in size with 50-60% ostial stenosis.  2nd diagonal (D2):  Small in size with a 60% ostial stenosis. This is at the site of an occluded LAD.  3rd diagonal (D3):  Small size with minor irregularities.  Circumflex (LCx):  Medium in size and nondominant. The vessel is occluded in the midsegment. This appears to be chronic occlusion as there are well-developed right to left collaterals.   1st obtuse marginal:  Normal in size with minor  irregularities.  Right Coronary Artery: Normal in size and dominant. There is 20% proximal stenosis and diffuse 20% mid stenosis in the midsegment. Mild ectasia is noted in the distal segment. Well-developed collaterals from the RCA to the left circumflex artery noted  Posterior descending artery: Normal in size with 20% proximal stenosis.  Posterior AV segment: Normal  Posterolateral branchs:  Normal  Left ventriculography: Left ventricular systolic function is mildly reduced , LVEF is estimated at 40-45% %, there is no significant mitral regurgitation . Moderate mid to distal anterior, apical and distal inferior hypokinesis.  PCI Note:  Following the diagnostic procedure, the decision was made to proceed with PCI.  Weight-based bivalirudin was given for anticoagulation. Once a therapeutic ACT was achieved, a 6 [place] Ikari left 3.5 guide catheter was inserted.  A Runthrough coronary guidewire was used to cross the lesion.  The lesion was predilated with a 2.5 x 12 mm balloon.  The lesion was then stented with a 3.0 x 23 mm Xience drug-eluting stent.  The stent was postdilated with a 3.25 x 15 mm noncompliant balloon.  Following PCI, there was 0% residual stenosis and TIMI-3 flow. Final angiography confirmed an excellent result. The patient tolerated the procedure well. There were no immediate procedural complications. A TR band was used for radial hemostasis. The patient was transferred to the post catheterization recovery area for further monitoring.  PCI Data: Vessel - LAD/Segment - mid Percent Stenosis (pre)  100% TIMI-flow 1 Stent : 3.0 x 23 mm Xience drug-eluting stent Percent Stenosis (post) 0% TIMI-flow (post) 3   Final Conclusions:  1. Severe 2 vessel coronary artery disease. The culprit for anterior ST elevation  elevation MI is occluded mid LAD. The mid left circumflex is chronically occluded with right to left collaterals. 2. Mildly reduced LV systolic function with an ejection  fraction of 40-45% and mild to moderate elevation of left ventricular end-diastolic pressure. 3. Successful angioplasty and drug-eluting stent placement to the mid LAD.  Recommendations:  Dual antiplatelet therapy for at least 12 months. Aggressive treatment of risk factors is recommended. I initiated small dose metoprolol and lisinopril for ischemic cardiomyopathy.  Kathlyn Sacramento MD, [facility] 04/30/2014, 12:26 PM

## 2014-05-01 DIAGNOSIS — I4901 Ventricular fibrillation: Secondary | ICD-10-CM

## 2014-05-01 DIAGNOSIS — I519 Heart disease, unspecified: Secondary | ICD-10-CM

## 2014-05-01 DIAGNOSIS — I255 Ischemic cardiomyopathy: Secondary | ICD-10-CM

## 2014-05-01 DIAGNOSIS — I2102 ST elevation (STEMI) myocardial infarction involving left anterior descending coronary artery: Secondary | ICD-10-CM

## 2014-05-01 LAB — LIPID PANEL
Cholesterol: 189 mg/dL (ref 0–200)
HDL: 32 mg/dL — AB (ref >39)
LDL CALC: 129 mg/dL — AB (ref 0–99)
Total CHOL/HDL Ratio: 5.9 RATIO
Triglycerides: 140 mg/dL (ref <150)
VLDL: 28 mg/dL (ref 0–40)

## 2014-05-01 LAB — BASIC METABOLIC PANEL
Anion gap: 14 (ref 5–15)
BUN: 13 mg/dL (ref 6–23)
CO2: 22 meq/L (ref 19–32)
Calcium: 8.9 mg/dL (ref 8.4–10.5)
Chloride: 104 mEq/L (ref 96–112)
Creatinine, Ser: 0.77 mg/dL (ref 0.50–1.35)
GFR calc Af Amer: >90 mL/min (ref >90)
GLUCOSE: 140 mg/dL — AB (ref 70–99)
POTASSIUM: 3.4 meq/L — AB (ref 3.7–5.3)
SODIUM: 140 meq/L (ref 137–147)

## 2014-05-01 LAB — CBC
HEMATOCRIT: 41.6 % (ref 39.0–52.0)
HEMOGLOBIN: 14.2 g/dL (ref 13.0–17.0)
MCH: 29.7 pg (ref 26.0–34.0)
MCHC: 34.1 g/dL (ref 30.0–36.0)
MCV: 87 fL (ref 78.0–100.0)
Platelets: 161 10*3/uL (ref 150–400)
RBC: 4.78 MIL/uL (ref 4.22–5.81)
RDW: 13.1 % (ref 11.5–15.5)
WBC: 7.3 10*3/uL (ref 4.0–10.5)

## 2014-05-01 LAB — POCT ACTIVATED CLOTTING TIME: Activated Clotting Time: 416 seconds

## 2014-05-01 LAB — TROPONIN I: Troponin I: 16.4 ng/mL (ref <0.30)

## 2014-05-01 MED ORDER — POTASSIUM CHLORIDE CRYS ER 20 MEQ PO TBCR
40.0000 meq | EXTENDED_RELEASE_TABLET | Freq: Once | ORAL | Status: AC
  Administered 2014-05-01: 40 meq via ORAL
  Filled 2014-05-01: qty 2

## 2014-05-01 MED FILL — Medication: Qty: 1 | Status: AC

## 2014-05-01 MED FILL — Sodium Chloride IV Soln 0.9%: INTRAVENOUS | Qty: 50 | Status: AC

## 2014-05-01 NOTE — Progress Notes (Signed)
CARDIAC REHAB PHASE I   PRE:  Rate/Rhythm: 82 SR  BP:  Supine: 116/80  Sitting:   Standing:    SaO2:   MODE:  Ambulation: 740 ft   POST:  Rate/Rhythm: 92 SR  BP:  Supine:   Sitting: 120/78  Standing:    SaO2:  1105-1200 Pt walked 740 ft with steady gait. No CP and tolerated well. MI education completed with pt and family except diet ed. Left heart healthy diet for them to read. To recliner after walk. Gave stent booklet and effient booklet. Understanding voiced of ed. Reviewed NTG use and calling 911. Discussed CRP 2 and pt gave permission to refer to [facility]. Will complete diet ed tomorrow.   Graylon Good, RN BSN  05/01/2014 11:57 AM

## 2014-05-01 NOTE — Progress Notes (Signed)
Patient ID: Jerry Montoya, male   DOB: 03-07-1956, 58 y.o.   MRN: [id number]   SUBJECTIVE: No chest pain this morning, no arrhythmias.  Feels good.   Scheduled Meds: . amiodarone  60 mg/hr Intravenous To ER  . amiodarone  30 mg/hr Intravenous To ER  . aspirin EC  81 mg Oral Daily  . atorvastatin  80 mg Oral q1800  . carvedilol  6.25 mg Oral BID WC  . lisinopril  2.5 mg Oral Daily  . ondansetron (ZOFRAN) IV  4 mg Intravenous Once  . pantoprazole  40 mg Oral QAC breakfast  . potassium chloride  40 mEq Oral Once  . prasugrel  10 mg Oral Daily   Continuous Infusions: . sodium chloride     PRN Meds:.acetaminophen, acetaminophen, albuterol, nitroGLYCERIN, ondansetron (ZOFRAN) IV, ondansetron (ZOFRAN) IV    Filed Vitals:   05/01/14 0000 05/01/14 0200 05/01/14 0400 05/01/14 0600  BP: 111/66 113/81 109/70 99/67  Pulse: 82 68 71 76  Temp: 98 F (36.7 C)     TempSrc: Oral     Resp: 18     Height:      Weight:      SpO2: 96%       Intake/Output Summary (Last 24 hours) at 05/01/14 0724 Last data filed at 04/30/14 2000  Gross per 24 hour  Intake    315 ml  Output   1200 ml  Net   -885 ml    LABS: Basic Metabolic Panel:  Recent Labs  04/30/14 1111 05/01/14 0500  NA 138 140  K 3.6* 3.4*  CL 101 104  CO2 23 22  GLUCOSE 121* 140*  BUN 15 13  CREATININE 0.81 0.77  CALCIUM 9.2 8.9   Liver Function Tests:  Recent Labs  04/30/14 1111  AST 19  ALT 17  ALKPHOS 84  BILITOT 0.4  PROT 7.9  ALBUMIN 4.2   No results found for this basename: LIPASE, AMYLASE,  in the last 72 hours CBC:  Recent Labs  04/30/14 1111 05/01/14 0500  WBC 7.1 7.3  HGB 16.0 14.2  HCT 46.7 41.6  MCV 89.6 87.0  PLT 178 161   Cardiac Enzymes:  Recent Labs  04/30/14 1510 04/30/14 1840 05/01/14 0100  TROPONINI 10.99* 16.71* 16.40*   BNP: No components found with this basename: POCBNP,  D-Dimer: No results found for this basename: DDIMER,  in the last 72 hours Hemoglobin A1C: No  results found for this basename: HGBA1C,  in the last 72 hours Fasting Lipid Panel:  Recent Labs  05/01/14 0500  CHOL 189  HDL 32*  LDLCALC 129*  TRIG 140  CHOLHDL 5.9   Thyroid Function Tests: No results found for this basename: TSH, T4TOTAL, FREET3, T3FREE, THYROIDAB,  in the last 72 hours Anemia Panel: No results found for this basename: VITAMINB12, FOLATE, FERRITIN, TIBC, IRON, RETICCTPCT,  in the last 72 hours  RADIOLOGY: No results found.  PHYSICAL EXAM General: NAD Neck: No JVD, no thyromegaly or thyroid nodule.  Lungs: Clear to auscultation bilaterally with normal respiratory effort. CV: Nondisplaced PMI.  Heart regular S1/S2, no S3/S4, no murmur.  No peripheral edema.  No carotid bruit.  Normal pedal pulses.  Abdomen: Soft, nontender, no hepatosplenomegaly, no distention.  Neurologic: Alert and oriented x 3.  Psych: Normal affect. Extremities: No clubbing or cyanosis.   TELEMETRY: Reviewed telemetry pt in NSR  ASSESSMENT AND PLAN: 58 yo with h/o hyperlipidemia and colon cancer s/p sigmoid colectomy 9/15 was admitted with vfib arrest  and anterior STEMI.  1. CAD: Anterior STEMI s/p DES to mLAD. Also has CTO LCx with good collaterals.  Doing well this morning.  Still has anterior STE on ECG.   - Continue ASA, Effient, atorva 80.  2. Ischemic cardiomyopathy: EF 40-45% on LV-gram.  Awaiting echo.  - Continue current Coreg and lisinopril. - If EF < 35% by echo, will need Lifevest.  - Will add spironolactone if EF down by echo and BP stable.  3. Vfib arrest: Related to STEMI.  Lifevest if EF < 35%.  4. May go to floor today, cardiac rehab, likely home tomorrow.   Loralie Champagne 05/01/2014 7:29 AM

## 2014-05-01 NOTE — Progress Notes (Signed)
Order to transfer to tele, Rm [id number] available. Pt informed and verbalized understanding. Report called to Elberta Fortis, RN. Pt transported to 3W with belongings via wheel chair.  [place] notified. Will cont to monitor closely.

## 2014-05-01 NOTE — Care Management Note (Signed)
    Page 1 of 1   05/01/2014     2:07:06 PM CARE MANAGEMENT NOTE 05/01/2014  Patient:  Jerry Montoya, Jerry Montoya   Account Number:  [id number]  Date Initiated:  05/01/2014  Documentation initiated by:  Elissa Hefty  Subjective/Objective Assessment:   adm w mi     Action/Plan:   lives w wife, pcp dr Christiane Ha [place]   Anticipated DC Date:  05/02/2014   Anticipated DC Plan:  [place]  CM consult  Medication Assistance      Choice offered to / List presented to:             Status of service:   Medicare Important Message given?   (If response is "NO", the following Medicare IM given date fields will be blank) Date Medicare IM given:   Medicare IM given by:   Date Additional Medicare IM given:   Additional Medicare IM given by:    Discharge Disposition:  HOME/SELF CARE  Per UR Regulation:  Reviewed for med. necessity/level of care/duration of stay  If discussed at [place] of Stay Meetings, dates discussed:    Comments:  10/12 1405 debbie Jove Beyl rn,bsn gave pt effient 30day free and copay assist card that brings copay down to 0 per month.

## 2014-05-01 NOTE — Progress Notes (Signed)
  Echocardiogram 2D Echocardiogram has been performed.  Donata Clay 05/01/2014, 9:49 AM

## 2014-05-02 LAB — BASIC METABOLIC PANEL
Anion gap: 13 (ref 5–15)
BUN: 18 mg/dL (ref 6–23)
CO2: 23 mEq/L (ref 19–32)
Calcium: 8.9 mg/dL (ref 8.4–10.5)
Chloride: 105 mEq/L (ref 96–112)
Creatinine, Ser: 0.81 mg/dL (ref 0.50–1.35)
GFR calc non Af Amer: >90 mL/min (ref >90)
GLUCOSE: 108 mg/dL — AB (ref 70–99)
POTASSIUM: 3.9 meq/L (ref 3.7–5.3)
SODIUM: 141 meq/L (ref 137–147)

## 2014-05-02 LAB — CBC
HCT: 41.8 % (ref 39.0–52.0)
Hemoglobin: 13.9 g/dL (ref 13.0–17.0)
MCH: 29.3 pg (ref 26.0–34.0)
MCHC: 33.3 g/dL (ref 30.0–36.0)
MCV: 88.2 fL (ref 78.0–100.0)
PLATELETS: 154 10*3/uL (ref 150–400)
RBC: 4.74 MIL/uL (ref 4.22–5.81)
RDW: 13.3 % (ref 11.5–15.5)
WBC: 6.8 10*3/uL (ref 4.0–10.5)

## 2014-05-02 MED ORDER — LEVALBUTEROL TARTRATE 45 MCG/ACT IN AERO: 1.0000 | INHALATION_SPRAY | Freq: Four times a day (QID) | RESPIRATORY_TRACT | Status: DC | PRN

## 2014-05-02 MED ORDER — LISINOPRIL 2.5 MG PO TABS: 2.5000 mg | ORAL_TABLET | Freq: Every day | ORAL | Status: DC

## 2014-05-02 MED ORDER — NITROGLYCERIN 0.4 MG SL SUBL: 0.4000 mg | SUBLINGUAL_TABLET | SUBLINGUAL | Status: DC | PRN

## 2014-05-02 MED ORDER — PNEUMOCOCCAL VAC POLYVALENT 25 MCG/0.5ML IJ INJ: 0.5000 mL | INJECTION | INTRAMUSCULAR | Status: DC

## 2014-05-02 MED ORDER — CARVEDILOL 6.25 MG PO TABS: 6.2500 mg | ORAL_TABLET | Freq: Two times a day (BID) | ORAL | Status: DC

## 2014-05-02 MED ORDER — ASPIRIN 81 MG PO TBEC: 81.0000 mg | DELAYED_RELEASE_TABLET | Freq: Every day | ORAL | Status: DC

## 2014-05-02 MED ORDER — PRASUGREL HCL 10 MG PO TABS: 10.0000 mg | ORAL_TABLET | Freq: Every day | ORAL | Status: DC

## 2014-05-02 MED ORDER — INFLUENZA VAC SPLIT QUAD 0.5 ML IM SUSY
0.5000 mL | PREFILLED_SYRINGE | Freq: Once | INTRAMUSCULAR | Status: AC
  Administered 2014-05-02: 0.5 mL via INTRAMUSCULAR
  Filled 2014-05-02: qty 0.5

## 2014-05-02 MED ORDER — PNEUMOCOCCAL VAC POLYVALENT 25 MCG/0.5ML IJ INJ
0.5000 mL | INJECTION | Freq: Once | INTRAMUSCULAR | Status: AC
  Administered 2014-05-02: 0.5 mL via INTRAMUSCULAR
  Filled 2014-05-02: qty 0.5

## 2014-05-02 MED ORDER — ATORVASTATIN CALCIUM 80 MG PO TABS: 80.0000 mg | ORAL_TABLET | Freq: Every day | ORAL | Status: DC

## 2014-05-02 MED ORDER — INFLUENZA VAC SPLIT QUAD 0.5 ML IM SUSY: 0.5000 mL | PREFILLED_SYRINGE | INTRAMUSCULAR | Status: DC

## 2014-05-02 NOTE — Progress Notes (Signed)
1040-1100 Pt ready for discharge. Completed diet information and pt voiced understanding. Referring to PHASE 2. Graylon Good RN BSN 05/02/2014 11:02 AM

## 2014-05-02 NOTE — Progress Notes (Signed)
Subjective: Feels good.  Slept well.   Objective: Vital signs in last 24 hours: Temp:  [98 F (36.7 C)-98.3 F (36.8 C)] 98.1 F (36.7 C) (10/13 0614) Pulse Rate:  [75-94] 86 (10/13 0614) Resp:  [16-18] 16 (10/13 0614) BP: (103-121)/(61-83) 103/65 mmHg (10/13 0614) SpO2:  [96 %-97 %] 96 % (10/13 0614) Weight:  [165 lb 6.4 oz (75.025 kg)] 165 lb 6.4 oz (75.025 kg) (10/13 8185) Last BM Date: 04/30/14  Intake/Output from previous day: 10/12 0701 - 10/13 0700 In: 1000 [P.O.:1000] Out: 2 [Stool:2] Intake/Output this shift:    Medications Current Facility-Administered Medications  Medication Dose Route Frequency Provider Last Rate Last Dose  . 0.9 %  sodium chloride infusion   Intravenous Continuous Ezequiel Essex, MD      . acetaminophen (TYLENOL) tablet 650 mg  650 mg Oral Q4H PRN Wellington Hampshire, MD      . albuterol (PROVENTIL) (2.5 MG/3ML) 0.083% nebulizer solution 2.5 mg  2.5 mg Inhalation Q6H PRN Fay Records, MD      . aspirin EC tablet 81 mg  81 mg Oral Daily Fay Records, MD   81 mg at 05/01/14 0900  . atorvastatin (LIPITOR) tablet 80 mg  80 mg Oral q1800 Fay Records, MD   80 mg at 05/01/14 2108  . carvedilol (COREG) tablet 6.25 mg  6.25 mg Oral BID WC Larey Dresser, MD   6.25 mg at 05/01/14 1830  . lisinopril (PRINIVIL,ZESTRIL) tablet 2.5 mg  2.5 mg Oral Daily Wellington Hampshire, MD   2.5 mg at 05/01/14 0900  . nitroGLYCERIN (NITROSTAT) SL tablet 0.4 mg  0.4 mg Sublingual Q5 Min x 3 PRN Fay Records, MD      . ondansetron [facility]) injection 4 mg  4 mg Intravenous Once Ezequiel Essex, MD      . ondansetron [facility]) injection 4 mg  4 mg Intravenous Q6H PRN Wellington Hampshire, MD      . pantoprazole (PROTONIX) EC tablet 40 mg  40 mg Oral QAC breakfast Wellington Hampshire, MD   40 mg at 05/01/14 0740  . prasugrel (EFFIENT) tablet 10 mg  10 mg Oral Daily Wellington Hampshire, MD   10 mg at 05/01/14 0901    PE: General appearance: alert, cooperative and no distress Lungs:  clear to auscultation bilaterally Heart: regular rate and rhythm, S1, S2 normal, no murmur, click, rub or gallop Extremities: No LEE Pulses: 2+ and symmetric Skin: Mild ecchymosis at the right radial cath site Neurologic: Grossly normal  Lab Results:   Recent Labs  04/30/14 1111 05/01/14 0500 05/02/14 0510  WBC 7.1 7.3 6.8  HGB 16.0 14.2 13.9  HCT 46.7 41.6 41.8  PLT 178 161 154   BMET  Recent Labs  04/30/14 1111 05/01/14 0500 05/02/14 0510  NA 138 140 141  K 3.6* 3.4* 3.9  CL 101 104 105  CO2 23 22 23   GLUCOSE 121* 140* 108*  BUN 15 13 18   CREATININE 0.81 0.77 0.81  CALCIUM 9.2 8.9 8.9   PT/INR  Recent Labs  04/30/14 1111  LABPROT 13.4  INR 1.02   Cholesterol  Recent Labs  05/01/14 0500  CHOL 189   Lipid Panel     Component Value Date/Time   CHOL 189 05/01/2014 0500   TRIG 140 05/01/2014 0500   HDL 32* 05/01/2014 0500   CHOLHDL 5.9 05/01/2014 0500   VLDL 28 05/01/2014 0500   LDLCALC 129* 05/01/2014 0500  Assessment/Plan  Active Problems: 1. CAD:  Anterior STEMI s/p DES to mLAD. Also has CTO LCx with good collaterals.  Still has anterior STE on ECG.  - Continue ASA, Effient, atorvastatin 80.  - Ambulated well with cardiac rehab. - BP stable 2. Ischemic cardiomyopathy: EF 45-50% on LV-gram.  - Continue current Coreg and lisinopril.  3. Vfib arrest: Related to STEMI. No lifevest needed.  EF 45-50% buy echo 05/01/14. 4. May go to floor today, cardiac rehab, likely home tomorrow.  5. HDL- LDL129-statin 6. Hypokalemia: resolved    LOS: 2 days    Jenkins Rouge

## 2014-05-02 NOTE — Discharge Summary (Signed)
Physician Discharge Summary     Cardiologist:  Ross(New) Patient ID: Jerry Montoya MRN: [id number] DOB/AGE: 1956-02-20 58 y.o.  Admit date: 04/30/2014 Discharge date: 05/02/2014  Admission Diagnoses:  Stemi  Discharge Diagnoses:  Active Problems:   ST elevation myocardial infarction (STEMI) of anterior wall   CAD:    Ischemic cardiomyopathy    Vfib arrest:.    HDL   Hypokalemia    Discharged Condition: stable  Hospital Course:   58 yo patient presented to ER with CP.  No prior cardiac history.  History of asthma.  Was having some increased wheezing this AM. Took inhaler and HR went up This AM around 10 he developed SSCP 8/10 at worst. Not pleuritic.  Arm numb. EMS called.  In ER, EKG with ST elevation anteriorly and pain 5/10.  Had hemicolectomy 6 wks ago (right) for polyp. While in the ER the patient stated "I dont feel well" and was noted to be in v. Fib.  Dr Wyvonnia Dusky was called to room and the pt became unresponsive with snoring respirations.  He was shocked one time with 200J for Vfib with return of consciousness.  He was alert and oriented at the time.  300mg  of IV amiodarone was given  The patient was taken emergently to the cath lab for coronary angiography.  This revealed severe 2 vessel coronary artery disease. The culprit for anterior ST elevation elevation MI is occluded mid LAD. The mid left circumflex is chronically occluded with right to left collaterals.  Mildly reduced LV systolic function with an ejection fraction of 40-45%, by LV gram, and mild to moderate elevation of left ventricular end-diastolic pressure.  He underwent successful angioplasty and drug-eluting stent placement to the mid LAD. ASA and Effient were started and will continue for at least one year.  He was also placed on lipitor 80mg , coreg and lisinopril.  Echocardiogram revealed an EF of 45-50%, anterior, anteroseptal and apical severe hypokinesis to akinesis, G1DD.  He ambulated 740 feet with Cardiac  Rehab with no chest pain.  CRP 2 discussed.  Low sodium diet and daily weight monitoring discussed.   The patient was seen by Dr. Asa Lente who felt he was stable for DC home.    Consults: Cardiac Rehab  Significant Diagnostic Studies:  Cardiac Catheterization Procedure Note  Name: Jerry Montoya  MRN: [id number]  DOB: 1955/09/11  Procedure: Left Heart Cath, Selective Coronary Angiography, LV angiography, PTCA and stenting of the mid LAD  Indication: Acute anterior ST elevation myocardial infarction. The patient had ventricular fibrillation and was shocked once in the emergency room with restoration of normal rhythm. He did not require CPR or intubation.  Medications:  Sedation: None  Contrast: 110 mL Omnipaque  Procedural Details: The right wrist was prepped, draped, and anesthetized with 1% lidocaine. Using the modified Seldinger technique, a 5 French Slender sheath was introduced into the right radial artery. 3 mg of verapamil was administered through the sheath, weight-based bivalirudin was administered intravenously. An Ikari left 3.5 was used for selective coronary angiography. A pigtail catheter was used for left ventriculography. Catheter exchanges were performed over an exchange length guidewire. There were no immediate procedural complications.  Procedural Findings:  Hemodynamics:  AO: 117/74 mmHg  LV: 117/12 mmHg  LVEDP: 21 mmHg  Coronary angiography:  Coronary dominance: Right  Left Main: 10-20% distal stenosis.  Left Anterior Descending (LAD): Normal in size with 40% proximal stenosis. There is a discrete 30% mid stenosis just after the first septal perforator. The vessel  is occluded in the midsegment with TIMI 1 flow.  1st diagonal (D1): Large in size with 50-60% ostial stenosis.  2nd diagonal (D2): Small in size with a 60% ostial stenosis. This is at the site of an occluded LAD.  3rd diagonal (D3): Small size with minor irregularities.  Circumflex (LCx): Medium in size and  nondominant. The vessel is occluded in the midsegment. This appears to be chronic occlusion as there are well-developed right to left collaterals.  1st obtuse marginal: Normal in size with minor irregularities.  Right Coronary Artery: Normal in size and dominant. There is 20% proximal stenosis and diffuse 20% mid stenosis in the midsegment. Mild ectasia is noted in the distal segment. Well-developed collaterals from the RCA to the left circumflex artery noted  Posterior descending artery: Normal in size with 20% proximal stenosis.  Posterior AV segment: Normal  Posterolateral branchs: Normal Left ventriculography: Left ventricular systolic function is mildly reduced , LVEF is estimated at 40-45% %, there is no significant mitral regurgitation . Moderate mid to distal anterior, apical and distal inferior hypokinesis.  PCI Note: Following the diagnostic procedure, the decision was made to proceed with PCI. Weight-based bivalirudin was given for anticoagulation. Once a therapeutic ACT was achieved, a 6 [place] Ikari left 3.5 guide catheter was inserted. A Runthrough coronary guidewire was used to cross the lesion. The lesion was predilated with a 2.5 x 12 mm balloon. The lesion was then stented with a 3.0 x 23 mm Xience drug-eluting stent. The stent was postdilated with a 3.25 x 15 mm noncompliant balloon. Following PCI, there was 0% residual stenosis and TIMI-3 flow. Final angiography confirmed an excellent result. The patient tolerated the procedure well. There were no immediate procedural complications. A TR band was used for radial hemostasis. The patient was transferred to the post catheterization recovery area for further monitoring.  PCI Data:  Vessel - LAD/Segment - mid  Percent Stenosis (pre) 100%  TIMI-flow 1  Stent : 3.0 x 23 mm Xience drug-eluting stent  Percent Stenosis (post) 0%  TIMI-flow (post) 3   Final Conclusions:  1. Severe 2 vessel coronary artery disease. The culprit for anterior  ST elevation elevation MI is occluded mid LAD. The mid left circumflex is chronically occluded with right to left collaterals.  2. Mildly reduced LV systolic function with an ejection fraction of 40-45% and mild to moderate elevation of left ventricular end-diastolic pressure.  3. Successful angioplasty and drug-eluting stent placement to the mid LAD.  Recommendations:  Dual antiplatelet therapy for at least 12 months. Aggressive treatment of risk factors is recommended. I initiated small dose metoprolol and lisinopril for ischemic cardiomyopathy.  Kathlyn Sacramento MD, [facility]  04/30/2014,    Echocardiogram Study Conclusions  - Left ventricle: The cavity size was normal. Wall thickness was normal. Systolic function was mildly reduced. The estimated ejection fraction was in the range of 45% to 50%. There is anterior, anteroseptal and apical severe hypokinesis to akinesis, suggestive of LAD territory ischemia/infarct. Doppler parameters are consistent with abnormal left ventricular relaxation (grade 1 diastolic dysfunction). The E/e&' ratio is between 8-15, suggesting indeterminate LV filling pressure. - Left atrium: The atrium was normal in size. - Inferior vena cava: The vessel was dilated. The respirophasic diameter changes were blunted (< 50%), consistent with elevated central venous pressure.  Impressions:  - LVEF 45-50%, LAD territory wall motion abnormality, diastolic dysfunction, likely elevated LV filling pressure, dilated IVC.  Lipid Panel     Component Value Date/Time   CHOL 189  05/01/2014 0500   TRIG 140 05/01/2014 0500   HDL 32* 05/01/2014 0500   CHOLHDL 5.9 05/01/2014 0500   VLDL 28 05/01/2014 0500   LDLCALC 129* 05/01/2014 0500     Treatments: See above  Discharge Exam: Blood pressure 103/65, pulse 86, temperature 98.1 F (36.7 C), temperature source Oral, resp. rate 16, height 5\' 10"  (1.778 m), weight 165 lb 6.4 oz (75.025 kg), SpO2 96.00%.   Disposition:  01-Home or Self Care      Discharge Instructions   Amb Referral to Cardiac Rehabilitation    Complete by:  As directed      Diet - low sodium heart healthy    Complete by:  As directed      Discharge instructions    Complete by:  As directed   Monitor your weight every morning.  If you gain 3 pounds in 24 hours, or 5 pounds in a week, call the office for instructions.     Increase activity slowly    Complete by:  As directed             Medication List    STOP taking these medications       albuterol 108 (90 BASE) MCG/ACT inhaler  Commonly known as:  PROVENTIL HFA;VENTOLIN HFA      TAKE these medications       acetaminophen 500 MG tablet  Commonly known as:  TYLENOL  Take 1,000 mg by mouth every 6 (six) hours as needed for mild pain.     aspirin 81 MG EC tablet  Take 1 tablet (81 mg total) by mouth daily.     atorvastatin 80 MG tablet  Commonly known as:  LIPITOR  Take 1 tablet (80 mg total) by mouth daily at 6 PM.     carvedilol 6.25 MG tablet  Commonly known as:  COREG  Take 1 tablet (6.25 mg total) by mouth 2 (two) times daily with a meal.     levalbuterol 45 MCG/ACT inhaler  Commonly known as:  XOPENEX HFA  Inhale 1-2 puffs into the lungs every 6 (six) hours as needed for wheezing.     lisinopril 2.5 MG tablet  Commonly known as:  PRINIVIL,ZESTRIL  Take 1 tablet (2.5 mg total) by mouth daily.     nitroGLYCERIN 0.4 MG SL tablet  Commonly known as:  NITROSTAT  Place 1 tablet (0.4 mg total) under the tongue every 5 (five) minutes x 3 doses as needed for chest pain.     prasugrel 10 MG Tabs tablet  Commonly known as:  EFFIENT  Take 1 tablet (10 mg total) by mouth daily.       Follow-up Information   Follow up with Richardson Dopp, PA-C On 05/17/2014. (8:30AM)    Specialty:  Physician Assistant   Contact information:   [address] [place] [address] [phone]      Greater than 30 minutes was spent completing the patient's  discharge.    SignedTarri Fuller, [place] 05/02/2014, 8:36 AM  Patient examined chart reviewed  Anterior MI  EF 40%  D/C hom xopenex instead of albuterol for asthma Right forearm mild echymosis   Outpatient f/u Dr Micheline Maze

## 2014-05-04 MED FILL — Bivalirudin For IV Soln 250 MG: INTRAVENOUS | Qty: 250 | Status: AC

## 2014-05-04 MED FILL — Sodium Chloride IV Soln 0.9%: INTRAVENOUS | Qty: 50 | Status: AC

## 2014-05-11 ENCOUNTER — Encounter: Payer: Self-pay | Admitting: Cardiology

## 2014-05-11 ENCOUNTER — Ambulatory Visit (INDEPENDENT_AMBULATORY_CARE_PROVIDER_SITE_OTHER): Payer: BC Managed Care – PPO | Admitting: Cardiology

## 2014-05-11 VITALS — BP 114/70 | HR 81 | Ht 70.0 in | Wt 166.0 lb

## 2014-05-11 DIAGNOSIS — Z79899 Other long term (current) drug therapy: Secondary | ICD-10-CM

## 2014-05-11 DIAGNOSIS — I252 Old myocardial infarction: Secondary | ICD-10-CM

## 2014-05-11 DIAGNOSIS — I255 Ischemic cardiomyopathy: Secondary | ICD-10-CM

## 2014-05-11 DIAGNOSIS — I2510 Atherosclerotic heart disease of native coronary artery without angina pectoris: Secondary | ICD-10-CM

## 2014-05-11 MED ORDER — ROSUVASTATIN CALCIUM 20 MG PO TABS: 20.0000 mg | ORAL_TABLET | Freq: Every day | ORAL | Status: DC

## 2014-05-11 MED ORDER — LISINOPRIL 5 MG PO TABS: 5.0000 mg | ORAL_TABLET | Freq: Every day | ORAL | Status: DC

## 2014-05-11 NOTE — Patient Instructions (Signed)
Will obtain labs today and call you with the results (BMET)  STOP LIPITOR  START CRESTOR 20 MG DAILY  START CQ10 200 MG DAILY  INCREASE YOUR LISINOPRIL TO 5 MG DAILY  Your physician recommends that you schedule a follow-up appointment in: [place]

## 2014-05-12 DIAGNOSIS — I25119 Atherosclerotic heart disease of native coronary artery with unspecified angina pectoris: Secondary | ICD-10-CM | POA: Insufficient documentation

## 2014-05-12 DIAGNOSIS — I255 Ischemic cardiomyopathy: Secondary | ICD-10-CM | POA: Insufficient documentation

## 2014-05-12 LAB — BASIC METABOLIC PANEL
BUN: 15 mg/dL (ref 6–23)
CALCIUM: 9.2 mg/dL (ref 8.4–10.5)
CO2: 25 meq/L (ref 19–32)
CREATININE: 1 mg/dL (ref 0.4–1.5)
Chloride: 106 mEq/L (ref 96–112)
GFR: 85.82 mL/min (ref >60.00)
Glucose, Bld: 85 mg/dL (ref 70–99)
Potassium: 4 mEq/L (ref 3.5–5.1)
Sodium: 136 mEq/L (ref 135–145)

## 2014-05-12 NOTE — Progress Notes (Signed)
Patient ID: Jerry Montoya, male   DOB: 1955-11-21, 58 y.o.   MRN: [id number] PCP: Dr. Felipa Eth  58 yo with history of asthma and colon cancer was admitted in 10/15 with anterior STEMI.  He had fresh total occlusion of the mid LAD and chronic total occlusion of the mid LCx with collaterals.  He had Xience DES to LAD.  Echo that admission showed EF 45-50%.  He has done reasonably well since getting home.  No further chest pain.  He is fatigued and little short of breath after walking 10-15 minutes.  This is new for him.  No orthopnea/PND.  He has some achiness in his muscles and joints, and he is concerned that this could be coming from atorvastatin.   ECG: NSR, slight ST elevation and biphasic T waves in the anteroseptal leads.   Labs (10/15) with K 3.9, creatinine 0.81, LDL 129  PMH: 1. Asthma: since his 21s.  2. Colon cancer: s/p sigmoid colectomy in 2015.  3. Hyperlipidemia 4. GERD 5. CAD: Anterior STEMI in 10/15, LHC showed total occlusion mid LAD and chronic total occlusion mid LCx with collaterals.  He had Xience DES to the LAD.   6. Ischemic cardiomyopathy: Echo (10/15) with EF 45-50%, anterior/anteroseptal/apical severe hypokinesis.    SH: Works at [employer], married, nonsmoker.   FH: Father with CAD.   ROS: All systems reviewed and negative except as per HPI.   Current Outpatient Prescriptions  Medication Sig Dispense Refill  . acetaminophen (TYLENOL) 500 MG tablet Take 1,000 mg by mouth every 6 (six) hours as needed for mild pain.      [name] Kitchen aspirin EC 81 MG EC tablet Take 1 tablet (81 mg total) by mouth daily.      . carvedilol (COREG) 6.25 MG tablet Take 1 tablet (6.25 mg total) by mouth 2 (two) times daily with a meal.  60 tablet  5  . Coenzyme Q10 (COQ10) 200 MG CAPS Take by mouth daily.      [name] Kitchen levalbuterol (XOPENEX HFA) 45 MCG/ACT inhaler Inhale 1-2 puffs into the lungs every 6 (six) hours as needed for wheezing.  1 Inhaler  12  . lisinopril (PRINIVIL,ZESTRIL) 5 MG  tablet Take 1 tablet (5 mg total) by mouth daily.  30 tablet  5  . nitroGLYCERIN (NITROSTAT) 0.4 MG SL tablet Place 1 tablet (0.4 mg total) under the tongue every 5 (five) minutes x 3 doses as needed for chest pain.  25 tablet  12  . prasugrel (EFFIENT) 10 MG TABS tablet Take 1 tablet (10 mg total) by mouth daily.  30 tablet  10  . rosuvastatin (CRESTOR) 20 MG tablet Take 1 tablet (20 mg total) by mouth daily.  90 tablet  3   No current facility-administered medications for this visit.    BP 114/70  Pulse 81  Ht 5\' 10"  (1.778 m)  Wt 166 lb (75.297 kg)  BMI 23.82 kg/m2 General: NAD Neck: No JVD, no thyromegaly or thyroid nodule.  Lungs: Mild end-expiratory wheezes. CV: Nondisplaced PMI.  Heart regular S1/S2, no S3/S4, no murmur.  No peripheral edema.  No carotid bruit.  Normal pedal pulses.  Abdomen: Soft, nontender, no hepatosplenomegaly, no distention.  Skin: Intact without lesions or rashes.  Neurologic: Alert and oriented x 3.  Psych: Normal affect. Extremities: No clubbing or cyanosis.  HEENT: Normal.   Assessment/Plan: 1. CAD: s/p anterior STEMI 10/15 with DES to mLAD.  Also had CTO mid LCx with collaterals that was not treated percutaneously.  He has no chest pain.  - Continue ASA 81, statin, Effient.  - I would like him to start cardiac rehab.  2. Ischemic cardiomyopathy: EF 45-50% on echo.  He does have some exertional dyspnea/fatigue that is new.  He is not volume overloaded on exam.   - Continue Coreg, increase lisinopril to 5 mg daily.  - BMET today - Repeat echo in 3 months.  3. Hyperlipidemia: ?myalgias with atorvastatin.  I will have him stop this and instead use Crestor 20 mg daily with lipids/LFTs in 2 months.   Loralie Champagne 05/12/2014

## 2014-05-17 ENCOUNTER — Encounter: Payer: BC Managed Care – PPO | Admitting: Cardiology

## 2014-05-17 ENCOUNTER — Encounter: Payer: BC Managed Care – PPO | Admitting: Physician Assistant

## 2014-05-18 ENCOUNTER — Encounter (HOSPITAL_COMMUNITY)
Admission: RE | Admit: 2014-05-18 | Discharge: 2014-05-18 | Disposition: A | Discharge status: Home / Self Care | Payer: 59 | Source: Ambulatory Visit | Attending: Cardiology | Admitting: Cardiology

## 2014-05-18 NOTE — Progress Notes (Signed)
Cardiac Rehab Medication Review by a Pharmacist  Does the patient  feel that his/her medications are working for him/her?  yes  Has the patient been experiencing any side effects to the medications prescribed?  no  Does the patient measure his/her own blood pressure or blood glucose at home?  no   Does the patient have any problems obtaining medications due to transportation or finances?   no  Understanding of regimen: excellent Understanding of indications: good Potential of compliance: excellent  Pharmacist comments: Patient is a pleasant 57 y.o. Male who presents to cardiac rehab for review of his medications today. He has a clear understanding of the medications he has been started on recently and reports adherence to his meds. He states that he tried Lipitor in the past and had a few aches, so he was recently switched over to Crestor which he has been tolerating well. He reports no other complaints or side effects today.  Megan E. Supple, Pharm.D Clinical Pharmacy Resident Pager: [phone] 05/18/2014 8:23 AM

## 2014-05-22 ENCOUNTER — Encounter (HOSPITAL_COMMUNITY): Payer: BC Managed Care – PPO

## 2014-05-22 ENCOUNTER — Encounter (HOSPITAL_COMMUNITY)
Admission: RE | Admit: 2014-05-22 | Discharge: 2014-05-22 | Disposition: A | Discharge status: Home / Self Care | Payer: BC Managed Care – PPO | Source: Ambulatory Visit | Attending: Cardiology | Admitting: Cardiology

## 2014-05-22 DIAGNOSIS — Z955 Presence of coronary angioplasty implant and graft: Secondary | ICD-10-CM | POA: Diagnosis present

## 2014-05-22 DIAGNOSIS — I213 ST elevation (STEMI) myocardial infarction of unspecified site: Secondary | ICD-10-CM | POA: Insufficient documentation

## 2014-05-22 NOTE — Progress Notes (Signed)
Pt in today for his first day of exercise in the cardiac rehab phase II program at 6:45 class.  Pt tolerated exercise with no complaints.   Monitor showed sr with inverted t waves which was present on most recent 12 lead ekg.  Medication list reconciled.  Pt verbalizes compliance with medication. PHQ2 score 0.  Pt with very "happy" outlook on life and feels positive about his recovery.  Pt short goal is strengthening.  Will monitor his MET level increases as he progress through the program.  Long term goal is to get back to normal.  Will monitor his progress toward this goal. Maurice Small RN, BSN

## 2014-05-24 ENCOUNTER — Encounter (HOSPITAL_COMMUNITY)
Admission: RE | Admit: 2014-05-24 | Discharge: 2014-05-24 | Disposition: A | Discharge status: Home / Self Care | Payer: BC Managed Care – PPO | Source: Ambulatory Visit | Attending: Cardiology | Admitting: Cardiology

## 2014-05-24 ENCOUNTER — Encounter (HOSPITAL_COMMUNITY): Payer: BC Managed Care – PPO

## 2014-05-24 DIAGNOSIS — I213 ST elevation (STEMI) myocardial infarction of unspecified site: Secondary | ICD-10-CM | POA: Diagnosis not present

## 2014-05-24 NOTE — Progress Notes (Signed)
Pt declined quality of life survey.  Pt stated he did not want to complete paperwork.  Archie Endo, MS, ACSM RCEP

## 2014-05-26 ENCOUNTER — Encounter (HOSPITAL_COMMUNITY): Payer: BC Managed Care – PPO

## 2014-05-26 ENCOUNTER — Encounter (HOSPITAL_COMMUNITY)
Admission: RE | Admit: 2014-05-26 | Discharge: 2014-05-26 | Disposition: A | Discharge status: Home / Self Care | Payer: BC Managed Care – PPO | Source: Ambulatory Visit | Attending: Cardiology | Admitting: Cardiology

## 2014-05-26 DIAGNOSIS — I213 ST elevation (STEMI) myocardial infarction of unspecified site: Secondary | ICD-10-CM | POA: Diagnosis not present

## 2014-05-29 ENCOUNTER — Encounter (HOSPITAL_COMMUNITY)
Admission: RE | Admit: 2014-05-29 | Discharge: 2014-05-29 | Disposition: A | Discharge status: Home / Self Care | Payer: BC Managed Care – PPO | Source: Ambulatory Visit | Attending: Cardiology | Admitting: Cardiology

## 2014-05-29 ENCOUNTER — Encounter (HOSPITAL_COMMUNITY): Payer: BC Managed Care – PPO

## 2014-05-29 DIAGNOSIS — I213 ST elevation (STEMI) myocardial infarction of unspecified site: Secondary | ICD-10-CM | POA: Diagnosis not present

## 2014-05-31 ENCOUNTER — Encounter (HOSPITAL_COMMUNITY)
Admission: RE | Admit: 2014-05-31 | Discharge: 2014-05-31 | Disposition: A | Discharge status: Home / Self Care | Payer: BC Managed Care – PPO | Source: Ambulatory Visit | Attending: Cardiology | Admitting: Cardiology

## 2014-05-31 ENCOUNTER — Encounter (HOSPITAL_COMMUNITY): Payer: BC Managed Care – PPO

## 2014-05-31 DIAGNOSIS — I213 ST elevation (STEMI) myocardial infarction of unspecified site: Secondary | ICD-10-CM | POA: Diagnosis not present

## 2014-05-31 NOTE — Progress Notes (Signed)
I have reviewed home exercise with Jerry Montoya. The patient was advised to walk 2-4 days per week outside of CRP II for 30-45 minutes continuously.  Pt will also complete one additional day of hand weights outside of CRP II.  We also discussed the possibly of Jerry Montoya getting a gym membership and what equipment he would be able to complete.  Progression of exercise prescription was discussed.  Reviewed THR, pulse, RPE, sign and symptoms, NTG use and when to call 911 or MD.  Pt voiced understanding. [phone]  Archie Endo, MS, ACSM RCEP 05/31/2014 4:14 PM

## 2014-06-02 ENCOUNTER — Encounter (HOSPITAL_COMMUNITY)
Admission: RE | Admit: 2014-06-02 | Discharge: 2014-06-02 | Disposition: A | Discharge status: Home / Self Care | Payer: BC Managed Care – PPO | Source: Ambulatory Visit | Attending: Cardiology | Admitting: Cardiology

## 2014-06-02 ENCOUNTER — Encounter (HOSPITAL_COMMUNITY): Payer: BC Managed Care – PPO

## 2014-06-02 DIAGNOSIS — I213 ST elevation (STEMI) myocardial infarction of unspecified site: Secondary | ICD-10-CM | POA: Diagnosis not present

## 2014-06-05 ENCOUNTER — Encounter (HOSPITAL_COMMUNITY)
Admission: RE | Admit: 2014-06-05 | Discharge: 2014-06-05 | Disposition: A | Discharge status: Home / Self Care | Payer: BC Managed Care – PPO | Source: Ambulatory Visit | Attending: Cardiology | Admitting: Cardiology

## 2014-06-05 ENCOUNTER — Encounter (HOSPITAL_COMMUNITY): Payer: BC Managed Care – PPO

## 2014-06-05 DIAGNOSIS — I213 ST elevation (STEMI) myocardial infarction of unspecified site: Secondary | ICD-10-CM | POA: Diagnosis not present

## 2014-06-07 ENCOUNTER — Encounter (HOSPITAL_COMMUNITY): Payer: BC Managed Care – PPO

## 2014-06-07 ENCOUNTER — Encounter (HOSPITAL_COMMUNITY)
Admission: RE | Admit: 2014-06-07 | Discharge: 2014-06-07 | Disposition: A | Discharge status: Home / Self Care | Payer: BC Managed Care – PPO | Source: Ambulatory Visit | Attending: Cardiology | Admitting: Cardiology

## 2014-06-07 DIAGNOSIS — I213 ST elevation (STEMI) myocardial infarction of unspecified site: Secondary | ICD-10-CM | POA: Diagnosis not present

## 2014-06-09 ENCOUNTER — Encounter (HOSPITAL_COMMUNITY): Payer: BC Managed Care – PPO

## 2014-06-09 ENCOUNTER — Encounter: Payer: Self-pay | Admitting: *Deleted

## 2014-06-09 ENCOUNTER — Encounter (HOSPITAL_COMMUNITY)
Admission: RE | Admit: 2014-06-09 | Discharge: 2014-06-09 | Disposition: A | Discharge status: Home / Self Care | Payer: BC Managed Care – PPO | Source: Ambulatory Visit | Attending: Cardiology | Admitting: Cardiology

## 2014-06-09 ENCOUNTER — Ambulatory Visit (INDEPENDENT_AMBULATORY_CARE_PROVIDER_SITE_OTHER): Payer: BC Managed Care – PPO | Admitting: Cardiology

## 2014-06-09 VITALS — BP 116/64 | HR 73 | Ht 70.0 in | Wt 166.0 lb

## 2014-06-09 DIAGNOSIS — I213 ST elevation (STEMI) myocardial infarction of unspecified site: Secondary | ICD-10-CM | POA: Diagnosis not present

## 2014-06-09 DIAGNOSIS — I255 Ischemic cardiomyopathy: Secondary | ICD-10-CM

## 2014-06-09 DIAGNOSIS — I2510 Atherosclerotic heart disease of native coronary artery without angina pectoris: Secondary | ICD-10-CM

## 2014-06-09 MED ORDER — LISINOPRIL 5 MG PO TABS: 5.0000 mg | ORAL_TABLET | Freq: Every day | ORAL | Status: DC

## 2014-06-09 MED ORDER — PANTOPRAZOLE SODIUM 40 MG PO TBEC: 40.0000 mg | DELAYED_RELEASE_TABLET | Freq: Every day | ORAL | Status: DC

## 2014-06-09 NOTE — Patient Instructions (Signed)
Your physician recommends that you return for a FASTING lipid profile /liver profile in 1 month.  Your physician has requested that you have an echocardiogram. Echocardiography is a painless test that uses sound waves to create images of your heart. It provides your doctor with information about the size and shape of your heart and how well your heart's chambers and valves are working. This procedure takes approximately one hour. There are no restrictions for this procedure. IN 2 MONTHS  Your physician wants you to follow-up in: 6 months with Dr Aundra Dubin. (May 2016). You will receive a reminder letter in the mail two months in advance. If you don't receive a letter, please call our office to schedule the follow-up appointment.

## 2014-06-10 ENCOUNTER — Encounter: Payer: Self-pay | Admitting: Cardiology

## 2014-06-10 NOTE — Progress Notes (Signed)
Patient ID: Jerry Montoya, male   DOB: 02/14/1956, 58 y.o.   MRN: [id number] PCP: Dr. Felipa Eth  58 yo with history of asthma and colon cancer was admitted in 10/15 with anterior STEMI.  He had fresh total occlusion of the mid LAD and chronic total occlusion of the mid LCx with collaterals.  He had Xience DES to LAD.  Echo that admission showed EF 45-50%.    He is doing well currenlty.  No further chest pain.  No exertional dyspnea.   No orthopnea/PND.  He is tolerating his meds without problems.  He is back at work. He is doing cardiac rehab.   Labs (10/15) with K 3.9, creatinine 0.81 => 1.0, LDL 129  PMH: 1. Asthma: since his 60s.  2. Colon cancer: s/p sigmoid colectomy in 2015.  3. Hyperlipidemia 4. GERD 5. CAD: Anterior STEMI in 10/15, LHC showed total occlusion mid LAD and chronic total occlusion mid LCx with collaterals.  He had Xience DES to the LAD.   6. Ischemic cardiomyopathy: Echo (10/15) with EF 45-50%, anterior/anteroseptal/apical severe hypokinesis.    SH: Works at [employer], married, nonsmoker.   FH: Father with CAD.   ROS: All systems reviewed and negative except as per HPI.   Current Outpatient Prescriptions  Medication Sig Dispense Refill  . acetaminophen (TYLENOL) 500 MG tablet Take 1,000 mg by mouth every 6 (six) hours as needed for mild pain.    [name] Kitchen ADVAIR DISKUS 100-50 MCG/DOSE AEPB   0  . aspirin EC 81 MG EC tablet Take 1 tablet (81 mg total) by mouth daily.    [name] Kitchen BREO ELLIPTA 100-25 MCG/INH AEPB   0  . carvedilol (COREG) 6.25 MG tablet Take 1 tablet (6.25 mg total) by mouth 2 (two) times daily with a meal. 60 tablet 5  . Coenzyme Q10 (COQ10) 200 MG CAPS Take by mouth daily.    [name] Kitchen EPIPEN 2-PAK 0.3 MG/0.3ML SOAJ injection   0  . fluticasone (FLONASE) 50 MCG/ACT nasal spray   0  . Fluticasone-Salmeterol (ADVAIR) 250-50 MCG/DOSE AEPB Inhale 1 puff into the lungs 2 (two) times daily.    [name] Kitchen levalbuterol (XOPENEX HFA) 45 MCG/ACT inhaler Inhale 1-2 puffs into the  lungs every 6 (six) hours as needed for wheezing. 1 Inhaler 12  . lisinopril (PRINIVIL,ZESTRIL) 5 MG tablet Take 1 tablet (5 mg total) by mouth daily. 30 tablet 5  . montelukast (SINGULAIR) 10 MG tablet   0  . nitroGLYCERIN (NITROSTAT) 0.4 MG SL tablet Place 1 tablet (0.4 mg total) under the tongue every 5 (five) minutes x 3 doses as needed for chest pain. 25 tablet 12  . prasugrel (EFFIENT) 10 MG TABS tablet Take 1 tablet (10 mg total) by mouth daily. 30 tablet 10  . rosuvastatin (CRESTOR) 20 MG tablet Take 1 tablet (20 mg total) by mouth daily. 90 tablet 3  . Spacer/Aero-Holding Chambers (AEROCHAMBER PLUS FLO-VU) MISC   0  . lisinopril (PRINIVIL,ZESTRIL) 5 MG tablet Take 1 tablet (5 mg total) by mouth daily. 90 tablet 3  . pantoprazole (PROTONIX) 40 MG tablet Take 1 tablet (40 mg total) by mouth daily. 90 tablet 3   No current facility-administered medications for this visit.    BP 116/64 mmHg  Pulse 73  Ht 5\' 10"  (1.778 m)  Wt 166 lb (75.297 kg)  BMI 23.82 kg/m2 General: NAD Neck: No JVD, no thyromegaly or thyroid nodule.  Lungs: Mild end-expiratory wheezes. CV: Nondisplaced PMI.  Heart regular S1/S2, no S3/S4, no  murmur.  No peripheral edema.  No carotid bruit.  Normal pedal pulses.  Abdomen: Soft, nontender, no hepatosplenomegaly, no distention.  Skin: Intact without lesions or rashes.  Neurologic: Alert and oriented x 3.  Psych: Normal affect. Extremities: No clubbing or cyanosis.   Assessment/Plan: 1. CAD: s/p anterior STEMI 10/15 with DES to mLAD.  Also had CTO mid LCx with collaterals that was not treated percutaneously.  He has no chest pain.  - Continue ASA 81, statin, Effient.  - Continue cardiac rehab.   2. Ischemic cardiomyopathy: EF 45-50% on echo.  No significant exertional dyspnea or volume overload. - Continue Coreg and lisinopril at current doses.  - Repeat echo in 2 more months.  3. Hyperlipidemia: No myalgias on Crestor, continue. Lipids/LFTs in 1 month.     Loralie Champagne 06/10/2014

## 2014-06-12 ENCOUNTER — Encounter (HOSPITAL_COMMUNITY): Payer: BC Managed Care – PPO

## 2014-06-12 ENCOUNTER — Encounter (HOSPITAL_COMMUNITY)
Admission: RE | Admit: 2014-06-12 | Discharge: 2014-06-12 | Disposition: A | Discharge status: Home / Self Care | Payer: BC Managed Care – PPO | Source: Ambulatory Visit | Attending: Cardiology | Admitting: Cardiology

## 2014-06-12 DIAGNOSIS — I213 ST elevation (STEMI) myocardial infarction of unspecified site: Secondary | ICD-10-CM | POA: Diagnosis not present

## 2014-06-12 NOTE — Progress Notes (Signed)
Jerry Montoya 58 y.o. male Nutrition Note Spoke with pt.  Nutrition Survey reviewed with pt. Pt is following Step 2 of the Therapeutic Lifestyle Changes diet. Pt expressed understanding of the information reviewed. Pt aware of nutrition education classes offered.  Nutrition Diagnosis ? Food-and nutrition-related knowledge deficit related to lack of exposure to information as related to diagnosis of: ? CVD   Nutrition Intervention ? Benefits of adopting Therapeutic Lifestyle Changes discussed when Medficts reviewed. ? Pt to attend the Portion Distortion class ? Pt to attend the  ? Nutrition I class                     ? Nutrition II class - met; 05/30/14 ? Continue client-centered nutrition education by RD, as part of interdisciplinary care.  Goal(s) ? Pt to describe the benefit of including fruits, vegetables, whole grains, and low-fat dairy products in a heart healthy meal plan.  Monitor and Evaluate progress toward nutrition goal with team.   Derek Mound, M.Ed, RD, LDN, CDE 06/12/2014 9:08 AM

## 2014-06-14 ENCOUNTER — Encounter (HOSPITAL_COMMUNITY): Payer: BC Managed Care – PPO

## 2014-06-14 ENCOUNTER — Encounter (HOSPITAL_COMMUNITY)
Admission: RE | Admit: 2014-06-14 | Discharge: 2014-06-14 | Disposition: A | Discharge status: Home / Self Care | Payer: BC Managed Care – PPO | Source: Ambulatory Visit | Attending: Cardiology | Admitting: Cardiology

## 2014-06-14 DIAGNOSIS — I213 ST elevation (STEMI) myocardial infarction of unspecified site: Secondary | ICD-10-CM | POA: Diagnosis not present

## 2014-06-19 ENCOUNTER — Encounter (HOSPITAL_COMMUNITY): Payer: BC Managed Care – PPO

## 2014-06-19 ENCOUNTER — Encounter (HOSPITAL_COMMUNITY)
Admission: RE | Admit: 2014-06-19 | Discharge: 2014-06-19 | Disposition: A | Discharge status: Home / Self Care | Payer: BC Managed Care – PPO | Source: Ambulatory Visit | Attending: Cardiology | Admitting: Cardiology

## 2014-06-19 DIAGNOSIS — I213 ST elevation (STEMI) myocardial infarction of unspecified site: Secondary | ICD-10-CM | POA: Diagnosis not present

## 2014-06-21 ENCOUNTER — Encounter (HOSPITAL_COMMUNITY)
Admission: RE | Admit: 2014-06-21 | Discharge: 2014-06-21 | Disposition: A | Discharge status: Home / Self Care | Payer: BC Managed Care – PPO | Source: Ambulatory Visit | Attending: Cardiology | Admitting: Cardiology

## 2014-06-21 ENCOUNTER — Encounter (HOSPITAL_COMMUNITY): Payer: BC Managed Care – PPO

## 2014-06-21 DIAGNOSIS — Z955 Presence of coronary angioplasty implant and graft: Secondary | ICD-10-CM | POA: Diagnosis present

## 2014-06-21 DIAGNOSIS — I213 ST elevation (STEMI) myocardial infarction of unspecified site: Secondary | ICD-10-CM | POA: Diagnosis not present

## 2014-06-23 ENCOUNTER — Encounter (HOSPITAL_COMMUNITY): Payer: BC Managed Care – PPO

## 2014-06-26 ENCOUNTER — Encounter (HOSPITAL_COMMUNITY): Payer: BC Managed Care – PPO

## 2014-06-28 ENCOUNTER — Encounter (HOSPITAL_COMMUNITY): Payer: BC Managed Care – PPO

## 2014-06-28 ENCOUNTER — Encounter (HOSPITAL_COMMUNITY)
Admission: RE | Admit: 2014-06-28 | Discharge: 2014-06-28 | Disposition: A | Discharge status: Home / Self Care | Payer: BC Managed Care – PPO | Source: Ambulatory Visit | Attending: Cardiology | Admitting: Cardiology

## 2014-06-28 DIAGNOSIS — I213 ST elevation (STEMI) myocardial infarction of unspecified site: Secondary | ICD-10-CM | POA: Diagnosis not present

## 2014-06-29 ENCOUNTER — Encounter (HOSPITAL_COMMUNITY): Payer: Self-pay | Admitting: Cardiovascular Disease

## 2014-06-30 ENCOUNTER — Encounter (HOSPITAL_COMMUNITY)
Admission: RE | Admit: 2014-06-30 | Discharge: 2014-06-30 | Disposition: A | Discharge status: Home / Self Care | Payer: BC Managed Care – PPO | Source: Ambulatory Visit | Attending: Cardiology | Admitting: Cardiology

## 2014-06-30 ENCOUNTER — Encounter (HOSPITAL_COMMUNITY): Payer: BC Managed Care – PPO

## 2014-06-30 DIAGNOSIS — I213 ST elevation (STEMI) myocardial infarction of unspecified site: Secondary | ICD-10-CM | POA: Diagnosis not present

## 2014-07-03 ENCOUNTER — Encounter (HOSPITAL_COMMUNITY): Payer: BC Managed Care – PPO

## 2014-07-04 ENCOUNTER — Other Ambulatory Visit (INDEPENDENT_AMBULATORY_CARE_PROVIDER_SITE_OTHER): Payer: BC Managed Care – PPO | Admitting: *Deleted

## 2014-07-04 DIAGNOSIS — I2510 Atherosclerotic heart disease of native coronary artery without angina pectoris: Secondary | ICD-10-CM

## 2014-07-04 DIAGNOSIS — I255 Ischemic cardiomyopathy: Secondary | ICD-10-CM

## 2014-07-04 LAB — LIPID PANEL
CHOL/HDL RATIO: 4
Cholesterol: 105 mg/dL (ref 0–200)
HDL: 26.3 mg/dL — ABNORMAL LOW (ref >39.00)
LDL Cholesterol: 62 mg/dL (ref 0–99)
NonHDL: 78.7
TRIGLYCERIDES: 82 mg/dL (ref 0.0–149.0)
VLDL: 16.4 mg/dL (ref 0.0–40.0)

## 2014-07-04 LAB — HEPATIC FUNCTION PANEL
ALBUMIN: 3.7 g/dL (ref 3.5–5.2)
ALK PHOS: 86 U/L (ref 39–117)
ALT: 31 U/L (ref 0–53)
AST: 22 U/L (ref 0–37)
BILIRUBIN DIRECT: 0 mg/dL (ref 0.0–0.3)
TOTAL PROTEIN: 6.6 g/dL (ref 6.0–8.3)
Total Bilirubin: 0.6 mg/dL (ref 0.2–1.2)

## 2014-07-05 ENCOUNTER — Encounter (HOSPITAL_COMMUNITY): Payer: BC Managed Care – PPO

## 2014-07-07 ENCOUNTER — Encounter (HOSPITAL_COMMUNITY): Payer: BC Managed Care – PPO

## 2014-07-07 ENCOUNTER — Ambulatory Visit
Admission: RE | Admit: 2014-07-07 | Discharge: 2014-07-07 | Disposition: A | Discharge status: Home / Self Care | Payer: BC Managed Care – PPO | Source: Ambulatory Visit | Attending: Nurse Practitioner | Admitting: Nurse Practitioner

## 2014-07-07 ENCOUNTER — Other Ambulatory Visit: Payer: Self-pay | Admitting: Nurse Practitioner

## 2014-07-07 DIAGNOSIS — J45901 Unspecified asthma with (acute) exacerbation: Secondary | ICD-10-CM

## 2014-07-10 ENCOUNTER — Telehealth (HOSPITAL_COMMUNITY): Payer: Self-pay | Admitting: *Deleted

## 2014-07-10 ENCOUNTER — Encounter (HOSPITAL_COMMUNITY): Payer: BC Managed Care – PPO

## 2014-07-10 NOTE — Telephone Encounter (Signed)
Pt dropped by rehab on Friday 12/18 with hacking cough, wheezes throughout.  Pt seen by primary md early in week and given atbx and inhaler. Pt advised not to exercise due to congested hacking cough.  Asked if pt needed transport to ER. Pt declined and preferred to see his primary md again and ask for cxray and nebulizer treatment.  Called today to inquire about his well being.  Message left. Cherre Huger, BSN

## 2014-07-12 ENCOUNTER — Encounter (HOSPITAL_COMMUNITY): Payer: BC Managed Care – PPO

## 2014-07-12 ENCOUNTER — Encounter (HOSPITAL_COMMUNITY): Admission: RE | Admit: 2014-07-12 | Payer: BC Managed Care – PPO | Source: Ambulatory Visit

## 2014-07-17 ENCOUNTER — Encounter (HOSPITAL_COMMUNITY): Payer: BC Managed Care – PPO

## 2014-07-17 ENCOUNTER — Encounter (HOSPITAL_COMMUNITY)
Admission: RE | Admit: 2014-07-17 | Discharge: 2014-07-17 | Disposition: A | Discharge status: Home / Self Care | Payer: BC Managed Care – PPO | Source: Ambulatory Visit | Attending: Cardiology | Admitting: Cardiology

## 2014-07-17 DIAGNOSIS — I213 ST elevation (STEMI) myocardial infarction of unspecified site: Secondary | ICD-10-CM | POA: Diagnosis not present

## 2014-07-17 NOTE — Progress Notes (Signed)
Pt returned back to cardiac rehab after short absence due to exacerbation of copd.  Pt is on nebulizer treatment, prednisone therapy and has a consult to see pulmonary md for ongoing care of copd.  Pt tolerated light exercise with modifications to workload based on tolerance.  Continue to monitor. Cherre Huger, BSN

## 2014-07-19 ENCOUNTER — Encounter (HOSPITAL_COMMUNITY)
Admission: RE | Admit: 2014-07-19 | Discharge: 2014-07-19 | Disposition: A | Discharge status: Home / Self Care | Payer: BC Managed Care – PPO | Source: Ambulatory Visit | Attending: Cardiology | Admitting: Cardiology

## 2014-07-19 ENCOUNTER — Encounter (HOSPITAL_COMMUNITY): Payer: BC Managed Care – PPO

## 2014-07-19 DIAGNOSIS — I213 ST elevation (STEMI) myocardial infarction of unspecified site: Secondary | ICD-10-CM | POA: Diagnosis not present

## 2014-07-24 ENCOUNTER — Encounter (HOSPITAL_COMMUNITY): Payer: 59

## 2014-07-24 ENCOUNTER — Encounter (HOSPITAL_COMMUNITY)
Admission: RE | Admit: 2014-07-24 | Discharge: 2014-07-24 | Disposition: A | Discharge status: Home / Self Care | Payer: 59 | Source: Ambulatory Visit | Attending: Cardiology | Admitting: Cardiology

## 2014-07-24 DIAGNOSIS — Z955 Presence of coronary angioplasty implant and graft: Secondary | ICD-10-CM | POA: Diagnosis present

## 2014-07-24 DIAGNOSIS — I213 ST elevation (STEMI) myocardial infarction of unspecified site: Secondary | ICD-10-CM | POA: Diagnosis present

## 2014-07-26 ENCOUNTER — Encounter (HOSPITAL_COMMUNITY): Payer: 59

## 2014-07-28 ENCOUNTER — Encounter (HOSPITAL_COMMUNITY): Payer: 59

## 2014-07-28 ENCOUNTER — Encounter (HOSPITAL_COMMUNITY)
Admission: RE | Admit: 2014-07-28 | Discharge: 2014-07-28 | Disposition: A | Discharge status: Home / Self Care | Payer: 59 | Source: Ambulatory Visit | Attending: Cardiology | Admitting: Cardiology

## 2014-07-28 DIAGNOSIS — I213 ST elevation (STEMI) myocardial infarction of unspecified site: Secondary | ICD-10-CM | POA: Diagnosis not present

## 2014-07-31 ENCOUNTER — Encounter (HOSPITAL_COMMUNITY): Payer: 59

## 2014-08-01 ENCOUNTER — Ambulatory Visit (HOSPITAL_COMMUNITY): Payer: 59 | Attending: Cardiology

## 2014-08-01 DIAGNOSIS — C189 Malignant neoplasm of colon, unspecified: Secondary | ICD-10-CM | POA: Diagnosis not present

## 2014-08-01 DIAGNOSIS — I2510 Atherosclerotic heart disease of native coronary artery without angina pectoris: Secondary | ICD-10-CM

## 2014-08-01 DIAGNOSIS — J45909 Unspecified asthma, uncomplicated: Secondary | ICD-10-CM | POA: Insufficient documentation

## 2014-08-01 DIAGNOSIS — I255 Ischemic cardiomyopathy: Secondary | ICD-10-CM | POA: Insufficient documentation

## 2014-08-01 DIAGNOSIS — K219 Gastro-esophageal reflux disease without esophagitis: Secondary | ICD-10-CM | POA: Diagnosis not present

## 2014-08-01 NOTE — Progress Notes (Signed)
2D Echo completed. 08/01/2014

## 2014-08-02 ENCOUNTER — Encounter (HOSPITAL_COMMUNITY): Payer: 59

## 2014-08-02 ENCOUNTER — Encounter (HOSPITAL_COMMUNITY)
Admission: RE | Admit: 2014-08-02 | Discharge: 2014-08-02 | Disposition: A | Discharge status: Home / Self Care | Payer: 59 | Source: Ambulatory Visit | Attending: Cardiology | Admitting: Cardiology

## 2014-08-02 DIAGNOSIS — I213 ST elevation (STEMI) myocardial infarction of unspecified site: Secondary | ICD-10-CM | POA: Diagnosis not present

## 2014-08-04 ENCOUNTER — Encounter (HOSPITAL_COMMUNITY)
Admission: RE | Admit: 2014-08-04 | Discharge: 2014-08-04 | Disposition: A | Discharge status: Home / Self Care | Payer: 59 | Source: Ambulatory Visit | Attending: Cardiology | Admitting: Cardiology

## 2014-08-04 ENCOUNTER — Encounter (HOSPITAL_COMMUNITY): Payer: 59

## 2014-08-04 DIAGNOSIS — I213 ST elevation (STEMI) myocardial infarction of unspecified site: Secondary | ICD-10-CM | POA: Diagnosis not present

## 2014-08-07 ENCOUNTER — Encounter (HOSPITAL_COMMUNITY): Payer: 59

## 2014-08-09 ENCOUNTER — Encounter (HOSPITAL_COMMUNITY)
Admission: RE | Admit: 2014-08-09 | Discharge: 2014-08-09 | Disposition: A | Discharge status: Home / Self Care | Payer: 59 | Source: Ambulatory Visit | Attending: Cardiology | Admitting: Cardiology

## 2014-08-09 ENCOUNTER — Encounter (HOSPITAL_COMMUNITY): Payer: 59

## 2014-08-09 DIAGNOSIS — I213 ST elevation (STEMI) myocardial infarction of unspecified site: Secondary | ICD-10-CM | POA: Diagnosis not present

## 2014-08-11 ENCOUNTER — Encounter (HOSPITAL_COMMUNITY): Payer: 59

## 2014-08-14 ENCOUNTER — Encounter (HOSPITAL_COMMUNITY): Payer: 59

## 2014-08-16 ENCOUNTER — Encounter (HOSPITAL_COMMUNITY)
Admission: RE | Admit: 2014-08-16 | Discharge: 2014-08-16 | Disposition: A | Discharge status: Home / Self Care | Payer: 59 | Source: Ambulatory Visit | Attending: Cardiology | Admitting: Cardiology

## 2014-08-16 ENCOUNTER — Encounter (HOSPITAL_COMMUNITY): Payer: 59

## 2014-08-16 DIAGNOSIS — I213 ST elevation (STEMI) myocardial infarction of unspecified site: Secondary | ICD-10-CM | POA: Diagnosis not present

## 2014-08-18 ENCOUNTER — Encounter (HOSPITAL_COMMUNITY): Payer: 59

## 2014-08-21 ENCOUNTER — Encounter (HOSPITAL_COMMUNITY): Payer: 59

## 2014-08-21 ENCOUNTER — Encounter (HOSPITAL_COMMUNITY)
Admission: RE | Admit: 2014-08-21 | Discharge: 2014-08-21 | Disposition: A | Discharge status: Home / Self Care | Payer: 59 | Source: Ambulatory Visit | Attending: Cardiology | Admitting: Cardiology

## 2014-08-21 DIAGNOSIS — Z955 Presence of coronary angioplasty implant and graft: Secondary | ICD-10-CM | POA: Insufficient documentation

## 2014-08-21 DIAGNOSIS — I213 ST elevation (STEMI) myocardial infarction of unspecified site: Secondary | ICD-10-CM | POA: Insufficient documentation

## 2014-08-23 ENCOUNTER — Encounter (HOSPITAL_COMMUNITY): Payer: 59

## 2014-08-25 ENCOUNTER — Encounter (HOSPITAL_COMMUNITY): Admission: RE | Admit: 2014-08-25 | Payer: 59 | Source: Ambulatory Visit

## 2014-08-25 ENCOUNTER — Encounter (HOSPITAL_COMMUNITY): Payer: 59

## 2014-08-28 ENCOUNTER — Encounter (HOSPITAL_COMMUNITY): Payer: 59

## 2014-08-30 ENCOUNTER — Encounter (HOSPITAL_COMMUNITY): Payer: 59

## 2014-09-01 ENCOUNTER — Encounter (HOSPITAL_COMMUNITY): Payer: 59

## 2014-09-01 ENCOUNTER — Telehealth (HOSPITAL_COMMUNITY): Payer: Self-pay | Admitting: *Deleted

## 2014-09-01 NOTE — Telephone Encounter (Signed)
Pt absent this week from cr was due back on Monday. Pt with new insurance for the new year and has an oop expense. Left message in regards to his desire to return to exercise or if he wants to be discharged with sessions completed. Contact information left for pt. Cherre Huger, BSN

## 2014-09-04 ENCOUNTER — Encounter (HOSPITAL_COMMUNITY): Payer: 59

## 2014-09-06 ENCOUNTER — Encounter (HOSPITAL_COMMUNITY): Payer: 59

## 2014-09-08 ENCOUNTER — Encounter (HOSPITAL_COMMUNITY): Payer: 59

## 2014-09-11 ENCOUNTER — Encounter (HOSPITAL_COMMUNITY): Payer: 59

## 2014-09-13 ENCOUNTER — Encounter (HOSPITAL_COMMUNITY): Payer: 59

## 2014-09-15 ENCOUNTER — Encounter (HOSPITAL_COMMUNITY): Payer: 59

## 2014-09-18 ENCOUNTER — Encounter (HOSPITAL_COMMUNITY): Payer: 59

## 2014-09-20 ENCOUNTER — Encounter (HOSPITAL_COMMUNITY): Payer: 59

## 2014-09-22 ENCOUNTER — Encounter (HOSPITAL_COMMUNITY): Payer: 59

## 2015-04-11 ENCOUNTER — Other Ambulatory Visit: Payer: Self-pay | Admitting: Gastroenterology

## 2015-04-20 ENCOUNTER — Telehealth: Payer: Self-pay | Admitting: Cardiology

## 2015-04-20 DIAGNOSIS — I2510 Atherosclerotic heart disease of native coronary artery without angina pectoris: Secondary | ICD-10-CM

## 2015-04-20 NOTE — Telephone Encounter (Signed)
New Message  Pt wants rn to call him back because pt is on the medication  Effieot, and he wants a surgical clearance and i informed him that the   office will have to call us to have the medications held and the appt made  He states that he just wants to ask RN questions

## 2015-04-20 NOTE — Telephone Encounter (Signed)
Pt requesting appt for office visit prior to colonoscopy scheduled for Nov 2016. Pt scheduled to see Richardson Dopp, PA,c 05/07/15, fasting lipid/BMET scheduled for 05/02/15.

## 2015-05-02 ENCOUNTER — Other Ambulatory Visit (INDEPENDENT_AMBULATORY_CARE_PROVIDER_SITE_OTHER): Payer: 59

## 2015-05-02 DIAGNOSIS — I2510 Atherosclerotic heart disease of native coronary artery without angina pectoris: Secondary | ICD-10-CM

## 2015-05-02 LAB — LIPID PANEL
Cholesterol: 182 mg/dL (ref 125–200)
HDL: 30 mg/dL — AB (ref >=40)
LDL Cholesterol: 125 mg/dL (ref <130)
TRIGLYCERIDES: 137 mg/dL (ref <150)
Total CHOL/HDL Ratio: 6.1 Ratio — ABNORMAL HIGH (ref <=5.0)
VLDL: 27 mg/dL (ref <30)

## 2015-05-02 LAB — BASIC METABOLIC PANEL
BUN: 19 mg/dL (ref 7–25)
CO2: 25 mmol/L (ref 20–31)
CREATININE: 0.89 mg/dL (ref 0.70–1.25)
Calcium: 9.1 mg/dL (ref 8.6–10.3)
Chloride: 107 mmol/L (ref 98–110)
Glucose, Bld: 98 mg/dL (ref 65–99)
Potassium: 3.9 mmol/L (ref 3.5–5.3)
Sodium: 139 mmol/L (ref 135–146)

## 2015-05-04 ENCOUNTER — Encounter: Payer: Self-pay | Admitting: Cardiology

## 2015-05-04 ENCOUNTER — Ambulatory Visit (INDEPENDENT_AMBULATORY_CARE_PROVIDER_SITE_OTHER): Payer: 59 | Admitting: Cardiology

## 2015-05-04 VITALS — BP 114/78 | HR 69 | Ht 70.0 in | Wt 181.8 lb

## 2015-05-04 DIAGNOSIS — I2109 ST elevation (STEMI) myocardial infarction involving other coronary artery of anterior wall: Secondary | ICD-10-CM

## 2015-05-04 DIAGNOSIS — I2510 Atherosclerotic heart disease of native coronary artery without angina pectoris: Secondary | ICD-10-CM

## 2015-05-04 DIAGNOSIS — E785 Hyperlipidemia, unspecified: Secondary | ICD-10-CM

## 2015-05-04 MED ORDER — LISINOPRIL 2.5 MG PO TABS: 2.5000 mg | ORAL_TABLET | Freq: Every day | ORAL | Status: DC

## 2015-05-04 MED ORDER — ROSUVASTATIN CALCIUM 20 MG PO TABS: 20.0000 mg | ORAL_TABLET | Freq: Every day | ORAL | Status: DC

## 2015-05-04 MED ORDER — CLOPIDOGREL BISULFATE 75 MG PO TABS: 75.0000 mg | ORAL_TABLET | Freq: Every day | ORAL | Status: DC

## 2015-05-04 MED ORDER — CARVEDILOL 3.125 MG PO TABS: 3.1250 mg | ORAL_TABLET | Freq: Two times a day (BID) | ORAL | Status: DC

## 2015-05-04 NOTE — Patient Instructions (Addendum)
Medication Instructions:  STOP Effient TAKE: Plavix  75 mg once daily Aspirin 81 mg once daily Carvedilol (Coreg) 3.125 mg twice daily Lisinopril 2.5 mg once daily  Crestor 20 mg once daily   Labwork: Your physician recommends that you return for lab work in: 2 months for cholesterol, liver panel You will need to fast for your blood work (nothing but water after midnight the night before your appointment)   Testing/Procedures: None Ordered   Follow-Up: Your physician wants you to follow-up in: 6 months with Dr. Aundra Dubin.  You will receive a reminder letter in the mail two months in advance. If you don't receive a letter, please call our office to schedule the follow-up appointment.

## 2015-05-06 DIAGNOSIS — E785 Hyperlipidemia, unspecified: Secondary | ICD-10-CM | POA: Insufficient documentation

## 2015-05-06 NOTE — Progress Notes (Signed)
Patient ID: ROSE HIPPLER, male   DOB: 08-31-1955, 59 y.o.   MRN: [id number] PCP: Dr. Felipa Eth  59 yo with history of asthma and colon cancer was admitted in 10/15 with anterior STEMI.  He had fresh total occlusion of the mid LAD and chronic total occlusion of the mid LCx with collaterals.  He had Xience DES to LAD.  Echo that admission showed EF 45-50%.  Repeat echo in 1/16 showed EF up to 55-60%.    He is doing well currently.  No further chest pain.  No exertional dyspnea.   No orthopnea/PND.  He has completed cardiac rehab.  Only complaint is that he fatigues more easily than in the past.  No BRBPR or melena.   He has quit taking lisinopril and Crestor.  He only takes aspirin about twice a week.  He only takes Coreg once a day.  He appparently just got tired of taking the medications.  He did not have any side effects.   Labs (10/15): K 3.9, creatinine 0.81 => 1.0, LDL 129 Labs (10/16): K 3.9, creatinine 0.89, LDL 125, HDL 30  ECG: NSR, normal  PMH: 1. Asthma: since his 31s.  2. Colon cancer: s/p sigmoid colectomy in 2015.  3. Hyperlipidemia 4. GERD 5. CAD: Anterior STEMI in 10/15, LHC showed total occlusion mid LAD and chronic total occlusion mid LCx with collaterals.  He had Xience DES to the LAD.   6. Ischemic cardiomyopathy: Echo (10/15) with EF 45-50%, anterior/anteroseptal/apical severe hypokinesis.  Echo (1/16) with EF 55-60%.   SH: Works at [employer], married Reeve Turnley), nonsmoker.   FH: Father with CAD.   ROS: All systems reviewed and negative except as per HPI.   Current Outpatient Prescriptions  Medication Sig Dispense Refill  . acetaminophen (TYLENOL) 500 MG tablet Take 1,000 mg by mouth every 6 (six) hours as needed for mild pain.    [name] Kitchen ADVAIR DISKUS 100-50 MCG/DOSE AEPB   0  . aspirin EC 81 MG EC tablet Take 1 tablet (81 mg total) by mouth daily.    [name] Kitchen BREO ELLIPTA 100-25 MCG/INH AEPB   0  . carvedilol (COREG) 3.125 MG tablet Take 1 tablet (3.125 mg total)  by mouth 2 (two) times daily with a meal. 180 tablet 3  . EPIPEN 2-PAK 0.3 MG/0.3ML SOAJ injection   0  . fluticasone (FLONASE) 50 MCG/ACT nasal spray   0  . Fluticasone-Salmeterol (ADVAIR) 250-50 MCG/DOSE AEPB Inhale 1 puff into the lungs 2 (two) times daily.    [name] Kitchen levalbuterol (XOPENEX HFA) 45 MCG/ACT inhaler Inhale 1-2 puffs into the lungs every 6 (six) hours as needed for wheezing. 1 Inhaler 12  . montelukast (SINGULAIR) 10 MG tablet   0  . nitroGLYCERIN (NITROSTAT) 0.4 MG SL tablet Place 1 tablet (0.4 mg total) under the tongue every 5 (five) minutes x 3 doses as needed for chest pain. 25 tablet 12  . pantoprazole (PROTONIX) 40 MG tablet Take 1 tablet (40 mg total) by mouth daily. 90 tablet 3  . Spacer/Aero-Holding Chambers (AEROCHAMBER PLUS FLO-VU) MISC   0  . clopidogrel (PLAVIX) 75 MG tablet Take 1 tablet (75 mg total) by mouth daily. 90 tablet 3  . Coenzyme Q10 (COQ10) 200 MG CAPS Take by mouth daily.    [name] Kitchen lisinopril (PRINIVIL,ZESTRIL) 2.5 MG tablet Take 1 tablet (2.5 mg total) by mouth daily. 90 tablet 3  . rosuvastatin (CRESTOR) 20 MG tablet Take 1 tablet (20 mg total) by mouth daily. 90 tablet 3  No current facility-administered medications for this visit.    BP 114/78 mmHg  Pulse 69  Ht 5\' 10"  (1.778 m)  Wt 181 lb 12.8 oz (82.464 kg)  BMI 26.09 kg/m2 General: NAD Neck: No JVD, no thyromegaly or thyroid nodule.  Lungs: Mild end-expiratory wheezes. CV: Nondisplaced PMI.  Heart regular S1/S2, no S3/S4, no murmur.  No peripheral edema.  No carotid bruit.  Normal pedal pulses.  Abdomen: Soft, nontender, no hepatosplenomegaly, no distention.  Skin: Intact without lesions or rashes.  Neurologic: Alert and oriented x 3.  Psych: Normal affect. Extremities: No clubbing or cyanosis.   Assessment/Plan: 1. CAD: s/p anterior STEMI 10/15 with DES to mLAD.  Also had CTO mid LCx with collaterals that was not treated percutaneously.  He has no chest pain.   - He has been on Effient > 1  year.  DAPT score = 2 so would be reasonable to switch to Plavix for long-term treatment.  Therefore, will have him stop Effient when he runs out of this current bottle and start Plavix 75 mg daily.   - He needs to take aspirin every day.   2. Ischemic cardiomyopathy: EF 45-50% on initial echo, improved to 55-60% in 1/16.  No significant exertional dyspnea or volume overload. - He needs to take Coreg twice a day, not once a day. - He will restart lisinopril 5 mg daily.   3. Hyperlipidemia: He had no problems with Crestor.  He needs to restart it at 20 mg daily.  Lipids/LFTs in 2 months.   Loralie Champagne 05/06/2015

## 2015-05-07 ENCOUNTER — Ambulatory Visit: Payer: 59 | Admitting: Physician Assistant

## 2015-05-14 ENCOUNTER — Other Ambulatory Visit: Payer: Self-pay | Admitting: Gastroenterology

## 2015-05-22 ENCOUNTER — Ambulatory Visit (HOSPITAL_COMMUNITY): Admission: RE | Admit: 2015-05-22 | Payer: 59 | Source: Ambulatory Visit | Admitting: Gastroenterology

## 2015-05-22 ENCOUNTER — Encounter (HOSPITAL_COMMUNITY): Payer: Self-pay | Admitting: Certified Registered Nurse Anesthetist

## 2015-05-22 ENCOUNTER — Encounter (HOSPITAL_COMMUNITY): Admission: RE | Payer: Self-pay | Source: Ambulatory Visit

## 2015-05-22 SURGERY — COLONOSCOPY WITH PROPOFOL
Anesthesia: Monitor Anesthesia Care

## 2015-05-22 MED ORDER — PROPOFOL 10 MG/ML IV BOLUS
INTRAVENOUS | Status: AC
  Filled 2015-05-22: qty 20

## 2015-05-22 NOTE — Anesthesia Preprocedure Evaluation (Deleted)
Anesthesia Evaluation Anesthesia Physical Anesthesia Plan Anesthesia Quick Evaluation  

## 2015-05-29 ENCOUNTER — Other Ambulatory Visit: Payer: Self-pay | Admitting: Gastroenterology

## 2015-07-06 ENCOUNTER — Other Ambulatory Visit: Payer: Self-pay | Admitting: Cardiology

## 2015-08-09 ENCOUNTER — Encounter (HOSPITAL_COMMUNITY): Payer: Self-pay | Admitting: *Deleted

## 2015-08-20 ENCOUNTER — Ambulatory Visit (HOSPITAL_COMMUNITY)
Admission: RE | Admit: 2015-08-20 | Discharge: 2015-08-20 | Disposition: A | Discharge status: Home / Self Care | Payer: 59 | Source: Ambulatory Visit | Attending: Gastroenterology | Admitting: Gastroenterology

## 2015-08-20 ENCOUNTER — Encounter (HOSPITAL_COMMUNITY)
Admission: RE | Disposition: A | Discharge status: Home / Self Care | Payer: Self-pay | Source: Ambulatory Visit | Attending: Gastroenterology

## 2015-08-20 ENCOUNTER — Encounter (HOSPITAL_COMMUNITY): Payer: Self-pay | Admitting: Certified Registered"

## 2015-08-20 ENCOUNTER — Encounter (HOSPITAL_COMMUNITY): Payer: Self-pay

## 2015-08-20 DIAGNOSIS — Z7902 Long term (current) use of antithrombotics/antiplatelets: Secondary | ICD-10-CM | POA: Diagnosis not present

## 2015-08-20 DIAGNOSIS — Z85038 Personal history of other malignant neoplasm of large intestine: Secondary | ICD-10-CM | POA: Diagnosis not present

## 2015-08-20 DIAGNOSIS — E7800 Pure hypercholesterolemia, unspecified: Secondary | ICD-10-CM | POA: Insufficient documentation

## 2015-08-20 DIAGNOSIS — Z1211 Encounter for screening for malignant neoplasm of colon: Secondary | ICD-10-CM | POA: Insufficient documentation

## 2015-08-20 DIAGNOSIS — Z7982 Long term (current) use of aspirin: Secondary | ICD-10-CM | POA: Insufficient documentation

## 2015-08-20 DIAGNOSIS — Z5309 Procedure and treatment not carried out because of other contraindication: Secondary | ICD-10-CM | POA: Insufficient documentation

## 2015-08-20 DIAGNOSIS — I252 Old myocardial infarction: Secondary | ICD-10-CM | POA: Diagnosis not present

## 2015-08-20 DIAGNOSIS — I2510 Atherosclerotic heart disease of native coronary artery without angina pectoris: Secondary | ICD-10-CM | POA: Diagnosis not present

## 2015-08-20 DIAGNOSIS — J45909 Unspecified asthma, uncomplicated: Secondary | ICD-10-CM | POA: Diagnosis not present

## 2015-08-20 HISTORY — DX: Essential (primary) hypertension: I10

## 2015-08-20 HISTORY — DX: Acute myocardial infarction, unspecified: I21.9

## 2015-08-20 SURGERY — CANCELLED PROCEDURE

## 2015-08-20 MED ORDER — SODIUM CHLORIDE 0.9 % IV SOLN: INTRAVENOUS | Status: DC

## 2015-08-20 MED ORDER — LACTATED RINGERS IV SOLN: INTRAVENOUS | Status: DC

## 2015-08-20 SURGICAL SUPPLY — 22 items

## 2015-08-20 NOTE — Anesthesia Preprocedure Evaluation (Deleted)
Anesthesia Evaluation  Patient identified by MRN, date of birth, ID band Patient awake    Reviewed: Allergy & Precautions, NPO status , Patient's Chart, lab work & pertinent test results  History of Anesthesia Complications Negative for: history of anesthetic complications  Airway Mallampati: II  TM Distance: >3 FB Neck ROM: Full    Dental no notable dental hx. (+) Dental Advisory Given   Pulmonary asthma ,    Pulmonary exam normal breath sounds clear to auscultation       Cardiovascular hypertension, Pt. on medications + CAD, + Past MI and + Cardiac Stents  Normal cardiovascular exam Rhythm:Regular Rate:Normal     Neuro/Psych negative neurological ROS  negative psych ROS   GI/Hepatic Neg liver ROS, GERD  Medicated and Controlled,  Endo/Other  negative endocrine ROS  Renal/GU negative Renal ROS  negative genitourinary   Musculoskeletal negative musculoskeletal ROS (+)   Abdominal   Peds negative pediatric ROS (+)  Hematology negative hematology ROS (+)   Anesthesia Other Findings   Reproductive/Obstetrics negative OB ROS                            Anesthesia Physical Anesthesia Plan  ASA: II  Anesthesia Plan: MAC   Post-op Pain Management:    Induction:   Airway Management Planned:   Additional Equipment:   Intra-op Plan:   Post-operative Plan:   Informed Consent: I have reviewed the patients History and Physical, chart, labs and discussed the procedure including the risks, benefits and alternatives for the proposed anesthesia with the patient or authorized representative who has indicated his/her understanding and acceptance.   Dental advisory given  Plan Discussed with: CRNA  Anesthesia Plan Comments:         Anesthesia Quick Evaluation

## 2015-08-20 NOTE — H&P (Signed)
Procedure: Surveillance colonoscopy. 03/23/2014 laparoscopic-assisted partial sigmoid colectomy to remove adenocarcinoma of the sigmoid colon performed. October 2015 anterior ST segment elevation myocardial infarction with placement of drug eluting coronary artery stents. Chronic aspirin and Plavix therapy.  History: The patient is a 60 year old male born 1955-10-13. He is scheduled to undergo surveillance colonoscopy post sigmoid colon adenocarcinoma resection in September 2015.  The patient did not stop taking Plavix one week prior to today's schedule colonoscopy.  Past medical history: Asthma. Hypercholesterolemia. Coronary artery disease. ST segment elevation myocardial infarction October 2015. Stage I adenocarcinoma of the sigmoid colon removed in September 2015.  Medication allergies: None  Exam: The patient is alert and lying comfortably on the endoscopy stretcher. Abdomen is soft and nontender to palpation. Lungs are clear to auscultation. Cardiac exam reveals a regular rhythm.  Plan: Cancel today's colonoscopy. The patient did not stop taking Plavix one week prior to his scheduled colonoscopy.

## 2015-10-23 ENCOUNTER — Telehealth: Payer: Self-pay | Admitting: Cardiology

## 2015-10-23 NOTE — Telephone Encounter (Signed)
Pt scheduled to see Dr Aundra Dubin 11/26/15, wife aware.

## 2015-10-23 NOTE — Telephone Encounter (Signed)
New Message  Pt wife stated that pt spoke w/ dr Aundra Dubin @ [facility] appt and stated he needed to f/u w/ [facility]- did not know when- pt has recall for April- Pt requested to speak w/ RN- Please call back and discuss.

## 2015-11-26 ENCOUNTER — Encounter: Payer: Self-pay | Admitting: Cardiology

## 2015-11-26 ENCOUNTER — Ambulatory Visit (INDEPENDENT_AMBULATORY_CARE_PROVIDER_SITE_OTHER): Payer: 59 | Admitting: Cardiology

## 2015-11-26 ENCOUNTER — Encounter: Payer: Self-pay | Admitting: *Deleted

## 2015-11-26 VITALS — BP 116/74 | HR 72 | Ht 70.0 in | Wt 169.8 lb

## 2015-11-26 DIAGNOSIS — I2510 Atherosclerotic heart disease of native coronary artery without angina pectoris: Secondary | ICD-10-CM | POA: Diagnosis not present

## 2015-11-26 DIAGNOSIS — E785 Hyperlipidemia, unspecified: Secondary | ICD-10-CM | POA: Diagnosis not present

## 2015-11-26 DIAGNOSIS — I255 Ischemic cardiomyopathy: Secondary | ICD-10-CM

## 2015-11-26 NOTE — Patient Instructions (Signed)
Medication Instructions:  Your physician recommends that you continue on your current medications as directed. Please refer to the Current Medication list given to you today.  Labwork: Lipid profile/BMET today  Testing/Procedures: Your physician has requested that you have en exercise stress myoview. For further information please visit [email or web address]. Please follow instruction sheet, as given.    Follow-Up: Your physician wants you to follow-up in: 6 months with Dr Aundra Dubin. (November 2017), You will receive a reminder letter in the mail two months in advance. If you don't receive a letter, please call our office to schedule the follow-up appointment.       If you need a refill on your cardiac medications before your next appointment, please call your pharmacy.

## 2015-11-26 NOTE — Progress Notes (Signed)
Patient ID: Jerry Montoya, male   DOB: 1955/07/27, 60 y.o.   MRN: [id number] PCP: Dr. Felipa Eth  60 yo with history of asthma and colon cancer was admitted in 10/15 with anterior STEMI.  He had fresh total occlusion of the mid LAD and chronic total occlusion of the mid LCx with collaterals.  He had Xience DES to LAD.  Echo that admission showed EF 45-50%.  Repeat echo in 1/16 showed EF up to 55-60%.    He is now taking all his medications as ordered.  Weight is down about 12 lbs.  However, he has been noticing episodes of central chest pain (aching).  These seem to occur somewhat at random.  They are not exertional and only last a few minutes. He has not used NTG.  He has had 4-5 episodes over the last month.  Once he almost went to the ER but the pain stopped so he stayed at home. He has been doing some walking with his wife.  He gets short of breath walking up stairs or walking briskly.  Thinks this may be related to his asthma.   Labs (10/15): K 3.9, creatinine 0.81 => 1.0, LDL 129 Labs (10/16): K 3.9, creatinine 0.89, LDL 125, HDL 30  ECG: NSR, normal  PMH: 1. Asthma: since his 83s.  2. Colon cancer: s/p sigmoid colectomy in 2015.  3. Hyperlipidemia 4. GERD 5. CAD: Anterior STEMI in 10/15, LHC showed total occlusion mid LAD and chronic total occlusion mid LCx with collaterals.  He had Xience DES to the LAD.   6. Ischemic cardiomyopathy: Echo (10/15) with EF 45-50%, anterior/anteroseptal/apical severe hypokinesis.  Echo (1/16) with EF 55-60%.   SH: Works at [employer], married Jerry Montoya), nonsmoker.   FH: Father with CAD.   ROS: All systems reviewed and negative except as per HPI.   Current Outpatient Prescriptions  Medication Sig Dispense Refill  . acetaminophen (TYLENOL) 500 MG tablet Take 500 mg by mouth as directed.     [name] Kitchen aspirin EC 81 MG EC tablet Take 1 tablet (81 mg total) by mouth daily.    [name] Kitchen BREO ELLIPTA 100-25 MCG/INH AEPB Inhale 1 puff into the lungs daily.   0  .  carvedilol (COREG) 3.125 MG tablet Take 3.125 mg by mouth 2 (two) times daily with a meal.    . clopidogrel (PLAVIX) 75 MG tablet Take 1 tablet (75 mg total) by mouth daily. 90 tablet 3  . Coenzyme Q10 (COQ10) 200 MG CAPS Take 1 tablet by mouth daily.     [name] Kitchen EPIPEN 2-PAK 0.3 MG/0.3ML SOAJ injection Inject 0.3 mg into the skin once.   0  . fluticasone (FLONASE) 50 MCG/ACT nasal spray Place 1 spray into both nostrils daily as needed for allergies.   0  . levalbuterol (XOPENEX HFA) 45 MCG/ACT inhaler Inhale 1-2 puffs into the lungs every 6 (six) hours as needed for wheezing. 1 Inhaler 12  . lisinopril (PRINIVIL,ZESTRIL) 2.5 MG tablet Take 1 tablet (2.5 mg total) by mouth daily. 90 tablet 3  . montelukast (SINGULAIR) 10 MG tablet Take 10 mg by mouth at bedtime.   0  . nitroGLYCERIN (NITROSTAT) 0.4 MG SL tablet Place 1 tablet (0.4 mg total) under the tongue every 5 (five) minutes x 3 doses as needed for chest pain. 25 tablet 12  . pantoprazole (PROTONIX) 40 MG tablet Take 1 tablet (40 mg total) by mouth daily. 90 tablet 3  . pantoprazole (PROTONIX) 40 MG tablet TAKE ONE TABLET BY  MOUTH ONCE DAILY 30 tablet 9  . PRESCRIPTION MEDICATION Inject 1 each into the skin every 7 (seven) days.    . rosuvastatin (CRESTOR) 20 MG tablet Take 1 tablet (20 mg total) by mouth daily. 90 tablet 3   No current facility-administered medications for this visit.    BP 116/74 mmHg  Pulse 72  Ht 5\' 10"  (1.778 m)  Wt 169 lb 12.8 oz (77.021 kg)  BMI 24.36 kg/m2 General: NAD Neck: No JVD, no thyromegaly or thyroid nodule.  Lungs: Mild end-expiratory wheezes. CV: Nondisplaced PMI.  Heart regular S1/S2, no S3/S4, no murmur.  No peripheral edema.  No carotid bruit.  Normal pedal pulses.  Abdomen: Soft, nontender, no hepatosplenomegaly, no distention.  Skin: Intact without lesions or rashes.  Neurologic: Alert and oriented x 3.  Psych: Normal affect. Extremities: No clubbing or cyanosis.   Assessment/Plan: 1. CAD: s/p  anterior STEMI 10/15 with DES to mLAD.  Also had CTO mid LCx with collaterals that was not treated percutaneously.  He has had atypical chest pain episodes recently.  He has exertional dyspnea but blames this on his asthma.   - DAPT score = 2 so would be reasonable to continue Plavix for long-term treatment.   - He needs to take aspirin every day.  - Continue statin, Coreg, lisinopril.  - Given atypical chest pain and exertional dyspnea, I think that it would be reasonable to risk stratify with ETT-Cardiolite.    2. Ischemic cardiomyopathy: EF 45-50% on initial echo, improved to 55-60% in 1/16.  Not volume overloaded on exam. - Continue Coreg and lisinopril. BMET today.  3. Hyperlipidemia: Continue Crestor, check lipids today.   If Cardiolite is normal, followup in 6 months.   Loralie Champagne 11/26/2015

## 2015-11-27 ENCOUNTER — Telehealth (HOSPITAL_COMMUNITY): Payer: Self-pay | Admitting: *Deleted

## 2015-11-27 LAB — BASIC METABOLIC PANEL
BUN: 19 mg/dL (ref 7–25)
CALCIUM: 9 mg/dL (ref 8.6–10.3)
CO2: 22 mmol/L (ref 20–31)
CREATININE: 0.9 mg/dL (ref 0.70–1.25)
Chloride: 103 mmol/L (ref 98–110)
Glucose, Bld: 87 mg/dL (ref 65–99)
Potassium: 4.2 mmol/L (ref 3.5–5.3)
Sodium: 138 mmol/L (ref 135–146)

## 2015-11-27 LAB — LIPID PANEL
CHOL/HDL RATIO: 4 ratio (ref <=5.0)
Cholesterol: 123 mg/dL — ABNORMAL LOW (ref 125–200)
HDL: 31 mg/dL — AB (ref >=40)
LDL CALC: 77 mg/dL (ref <130)
TRIGLYCERIDES: 75 mg/dL (ref <150)
VLDL: 15 mg/dL (ref <30)

## 2015-11-27 NOTE — Telephone Encounter (Signed)
Patient given detailed instructions per Myocardial Perfusion Study Information Sheet for the test on 11/30/15 at 0815. Patient notified to arrive 15 minutes early and that it is imperative to arrive on time for appointment to keep from having the test rescheduled.  If you need to cancel or reschedule your appointment, please call the office within 24 hours of your appointment. Failure to do so may result in a cancellation of your appointment, and a $50 no show fee. Patient verbalized understanding.Thessaly Mccullers, Ranae Palms

## 2015-11-30 ENCOUNTER — Ambulatory Visit (HOSPITAL_COMMUNITY): Payer: 59 | Attending: Cardiovascular Disease

## 2015-11-30 DIAGNOSIS — E785 Hyperlipidemia, unspecified: Secondary | ICD-10-CM | POA: Diagnosis not present

## 2015-11-30 DIAGNOSIS — R9439 Abnormal result of other cardiovascular function study: Secondary | ICD-10-CM | POA: Insufficient documentation

## 2015-11-30 DIAGNOSIS — I2510 Atherosclerotic heart disease of native coronary artery without angina pectoris: Secondary | ICD-10-CM

## 2015-11-30 DIAGNOSIS — R0609 Other forms of dyspnea: Secondary | ICD-10-CM | POA: Diagnosis not present

## 2015-11-30 DIAGNOSIS — R079 Chest pain, unspecified: Secondary | ICD-10-CM | POA: Insufficient documentation

## 2015-11-30 DIAGNOSIS — Z8249 Family history of ischemic heart disease and other diseases of the circulatory system: Secondary | ICD-10-CM | POA: Diagnosis not present

## 2015-11-30 DIAGNOSIS — I255 Ischemic cardiomyopathy: Secondary | ICD-10-CM | POA: Diagnosis not present

## 2015-11-30 LAB — MYOCARDIAL PERFUSION IMAGING
CHL CUP MPHR: 159 {beats}/min
CHL CUP NUCLEAR SDS: 3
CHL CUP NUCLEAR SRS: 6
CHL CUP NUCLEAR SSS: 9
CSEPED: 6 min
Estimated workload: 7 METS
Exercise duration (sec): 0 s
LV dias vol: 98 mL (ref 62–150)
LV sys vol: 41 mL
NUC STRESS TID: 0.94
Peak HR: 144 {beats}/min
Percent HR: 90 %
RATE: 0.3
Rest HR: 76 {beats}/min

## 2015-11-30 MED ORDER — TECHNETIUM TC 99M SESTAMIBI GENERIC - CARDIOLITE
32.7000 | Freq: Once | INTRAVENOUS | Status: AC | PRN
  Administered 2015-11-30: 32.7 via INTRAVENOUS

## 2015-11-30 MED ORDER — TECHNETIUM TC 99M SESTAMIBI GENERIC - CARDIOLITE
10.8000 | Freq: Once | INTRAVENOUS | Status: AC | PRN
  Administered 2015-11-30: 11 via INTRAVENOUS

## 2016-04-09 ENCOUNTER — Encounter: Payer: Self-pay | Admitting: Cardiology

## 2016-04-23 ENCOUNTER — Ambulatory Visit (INDEPENDENT_AMBULATORY_CARE_PROVIDER_SITE_OTHER): Payer: 59 | Admitting: Cardiology

## 2016-04-23 ENCOUNTER — Encounter: Payer: Self-pay | Admitting: Cardiology

## 2016-04-23 VITALS — BP 124/70 | HR 87 | Ht 70.0 in | Wt 168.0 lb

## 2016-04-23 DIAGNOSIS — I255 Ischemic cardiomyopathy: Secondary | ICD-10-CM

## 2016-04-23 DIAGNOSIS — I2510 Atherosclerotic heart disease of native coronary artery without angina pectoris: Secondary | ICD-10-CM | POA: Diagnosis not present

## 2016-04-23 MED ORDER — CLOPIDOGREL BISULFATE 75 MG PO TABS: 75.0000 mg | ORAL_TABLET | Freq: Every day | ORAL | 1 refills | Status: DC

## 2016-04-23 MED ORDER — LISINOPRIL 2.5 MG PO TABS: 2.5000 mg | ORAL_TABLET | Freq: Every day | ORAL | 1 refills | Status: DC

## 2016-04-23 MED ORDER — CARVEDILOL 3.125 MG PO TABS: 3.1250 mg | ORAL_TABLET | Freq: Two times a day (BID) | ORAL | 1 refills | Status: DC

## 2016-04-23 MED ORDER — ROSUVASTATIN CALCIUM 20 MG PO TABS: 20.0000 mg | ORAL_TABLET | Freq: Every day | ORAL | 1 refills | Status: DC

## 2016-04-23 MED ORDER — ISOSORBIDE MONONITRATE ER 30 MG PO TB24: 30.0000 mg | ORAL_TABLET | Freq: Every day | ORAL | 1 refills | Status: DC

## 2016-04-23 NOTE — Patient Instructions (Signed)
Medication Instructions:   Start Imdur (isosorbide mononitrate) 30mg  daily  Labwork: None  Testing/Procedures: none  Follow-Up: Your physician recommends that you schedule a follow-up appointment in: about 6 weeks with Dr Aundra Dubin.        If you need a refill on your cardiac medications before your next appointment, please call your pharmacy.

## 2016-04-24 NOTE — Progress Notes (Signed)
Patient ID: Jerry Montoya, male   DOB: October 19, 1955, 60 y.o.   MRN: [id number] PCP: Dr. Felipa Eth  60 yo with history of asthma and colon cancer was admitted in 10/15 with anterior STEMI.  He had fresh total occlusion of the mid LAD and chronic total occlusion of the mid LCx with collaterals.  He had Xience DES to LAD.  Echo that admission showed EF 45-50%.  Repeat echo in 1/16 showed EF up to 55-60%.  In 5/17, he had a Cardiolite that was low risk with a fixed apical inferior defect and no ischemia.   Just got laid off from job and is under stress/anxious.  He is short of breath after walking up 2 flights of steps and also gets mild chest pressure.  He is short of breath walking fast.  No NTG use.  Symptoms have been stable > 8 months.  Labs (10/15): K 3.9, creatinine 0.81 => 1.0, LDL 129 Labs (10/16): K 3.9, creatinine 0.89, LDL 125, HDL 30 Labs (5/17): LDL 77, HDl 31, K 4.2, creatinine 0.9  ECG: NSR, normal  PMH: 1. Asthma: since his 60s.  2. Colon cancer: s/p sigmoid colectomy in 2015.  3. Hyperlipidemia 4. GERD 5. CAD: Anterior STEMI in 10/15, LHC showed total occlusion mid LAD and chronic total occlusion mid LCx with collaterals.  He had Xience DES to the LAD.   - Cardiolite (5/17) with fixed apical inferior defect, no ischemia.  Low risk study.  EF 58%.  6. Ischemic cardiomyopathy: Echo (10/15) with EF 45-50%, anterior/anteroseptal/apical severe hypokinesis.  Echo (1/16) with EF 55-60%.   SH: Works at [employer], married Christofher Eastman), nonsmoker.   FH: Father with CAD.   ROS: All systems reviewed and negative except as per HPI.   Current Outpatient Prescriptions  Medication Sig Dispense Refill  . aspirin EC 81 MG EC tablet Take 1 tablet (81 mg total) by mouth daily.    . carvedilol (COREG) 3.125 MG tablet Take 1 tablet (3.125 mg total) by mouth 2 (two) times daily with a meal. 180 tablet 1  . clopidogrel (PLAVIX) 75 MG tablet Take 1 tablet (75 mg total) by mouth daily. 90  tablet 1  . EPIPEN 2-PAK 0.3 MG/0.3ML SOAJ injection Inject 0.3 mg into the skin once.   0  . levalbuterol (XOPENEX HFA) 45 MCG/ACT inhaler Inhale 1-2 puffs into the lungs every 6 (six) hours as needed for wheezing. 1 Inhaler 12  . lisinopril (PRINIVIL,ZESTRIL) 2.5 MG tablet Take 1 tablet (2.5 mg total) by mouth daily. 90 tablet 1  . montelukast (SINGULAIR) 10 MG tablet Take 10 mg by mouth at bedtime.   0  . nitroGLYCERIN (NITROSTAT) 0.4 MG SL tablet Place 1 tablet (0.4 mg total) under the tongue every 5 (five) minutes x 3 doses as needed for chest pain. 25 tablet 12  . pantoprazole (PROTONIX) 40 MG tablet Take 1 tablet (40 mg total) by mouth daily. 90 tablet 3  . pantoprazole (PROTONIX) 40 MG tablet TAKE ONE TABLET BY MOUTH ONCE DAILY 30 tablet 9  . PRESCRIPTION MEDICATION Inject 1 each into the skin every 7 (seven) days.    . rosuvastatin (CRESTOR) 20 MG tablet Take 1 tablet (20 mg total) by mouth daily. 90 tablet 1  . acetaminophen (TYLENOL) 500 MG tablet Take 500 mg by mouth as directed.     [name] Kitchen BREO ELLIPTA 100-25 MCG/INH AEPB Inhale 1 puff into the lungs daily.   0  . Coenzyme Q10 (COQ10) 200 MG CAPS  Take 1 tablet by mouth daily.     . fluticasone (FLONASE) 50 MCG/ACT nasal spray Place 1 spray into both nostrils daily as needed for allergies.   0  . isosorbide mononitrate (IMDUR) 30 MG 24 hr tablet Take 1 tablet (30 mg total) by mouth daily. 90 tablet 1   No current facility-administered medications for this visit.     BP 124/70   Pulse 87   Ht 5\' 10"  (1.778 m)   Wt 168 lb (76.2 kg)   BMI 24.11 kg/m  General: NAD Neck: No JVD, no thyromegaly or thyroid nodule.  Lungs: Mild end-expiratory wheezes. CV: Nondisplaced PMI.  Heart regular S1/S2, no S3/S4, no murmur.  No peripheral edema.  No carotid bruit.  Normal pedal pulses.  Abdomen: Soft, nontender, no hepatosplenomegaly, no distention.  Skin: Intact without lesions or rashes.  Neurologic: Alert and oriented x 3.  Psych: Normal  affect. Extremities: No clubbing or cyanosis.   Assessment/Plan: 1. CAD: s/p anterior STEMI 10/15 with DES to mLAD.  Also had CTO mid LCx with collaterals that was not treated percutaneously.  He does have exertional dyspnea and mild chest pressure.  This occurs with moderate to heavy exertion and the pattern has not changed.  Recent Cardiolite was low risk with no ischemia.  ?symptoms from coronary ischemia or from asthma. - DAPT score = 2 so would be reasonable to continue Plavix for long-term treatment.    - Continue statin, Coreg, lisinopril, ASA 81 daily.  - With low risk Cardiolite, would hold off on cardiac cath.  I will add Imdur 30 mg daily to see if it helps symptoms, but as above, symptoms may be due to asthma.    2. Ischemic cardiomyopathy: EF 45-50% on initial echo, improved to 55-60% in 1/16.  Not volume overloaded on exam. - Continue Coreg and lisinopril. 3. Hyperlipidemia: Continue Crestor, good lipids 5/17.   Followup in 6 wks.   Loralie Champagne 04/24/2016

## 2016-05-26 ENCOUNTER — Ambulatory Visit: Payer: 59 | Admitting: Cardiology

## 2016-09-11 ENCOUNTER — Other Ambulatory Visit: Payer: Self-pay | Admitting: Cardiology

## 2016-09-11 DIAGNOSIS — I255 Ischemic cardiomyopathy: Secondary | ICD-10-CM

## 2016-09-11 DIAGNOSIS — I2510 Atherosclerotic heart disease of native coronary artery without angina pectoris: Secondary | ICD-10-CM

## 2016-10-22 ENCOUNTER — Other Ambulatory Visit: Payer: Self-pay | Admitting: Otolaryngology

## 2016-10-22 DIAGNOSIS — K219 Gastro-esophageal reflux disease without esophagitis: Secondary | ICD-10-CM

## 2016-10-22 DIAGNOSIS — R1314 Dysphagia, pharyngoesophageal phase: Secondary | ICD-10-CM

## 2016-10-28 IMAGING — CR DG CHEST 2V
2 series · 2 of 2 positions shown · non-contrast
Comparison: 02/23/2014

CLINICAL DATA: Asthma exacerbation, cough and shortness of breath

EXAM:
CHEST  2 VIEW

[w chest pa]
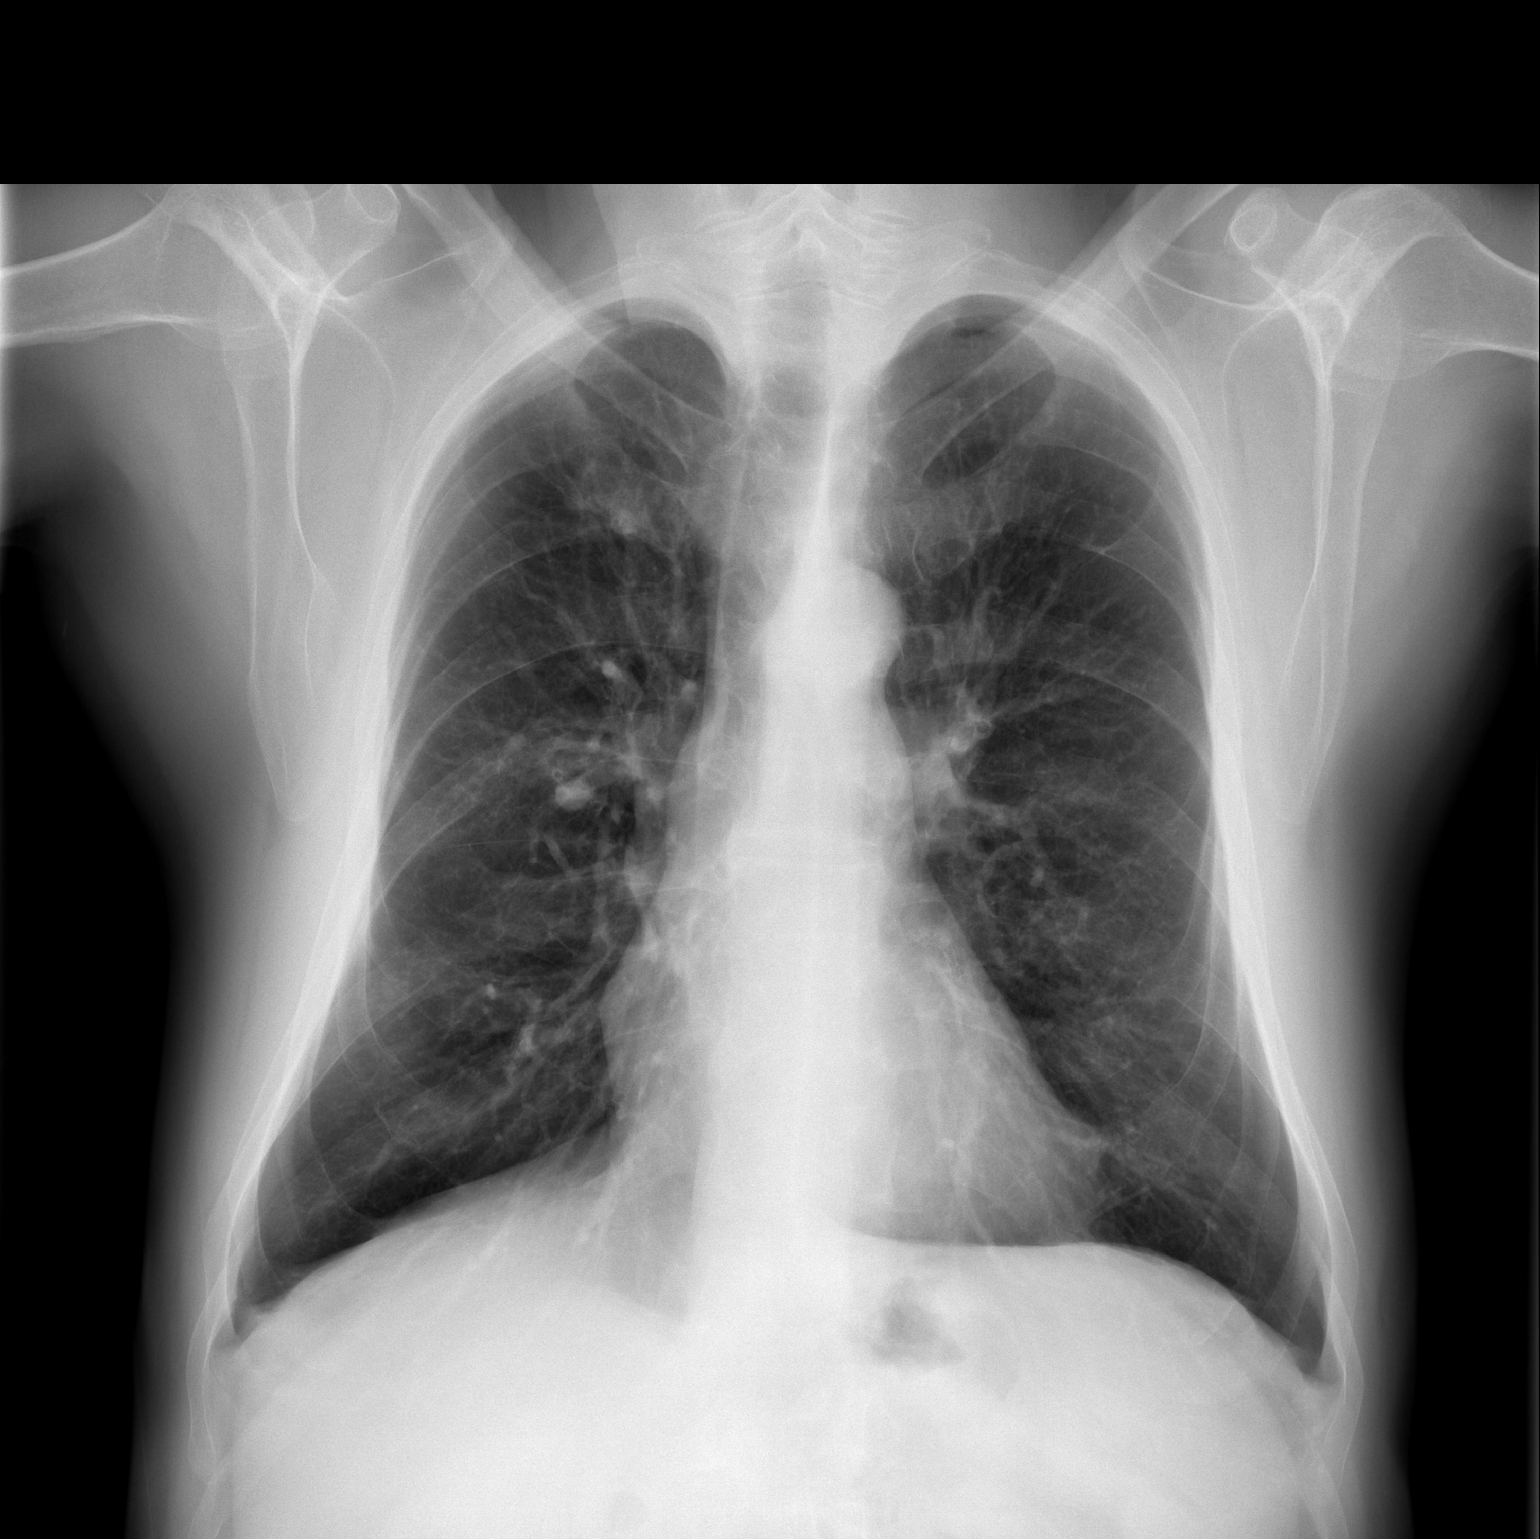

[w chest lat]
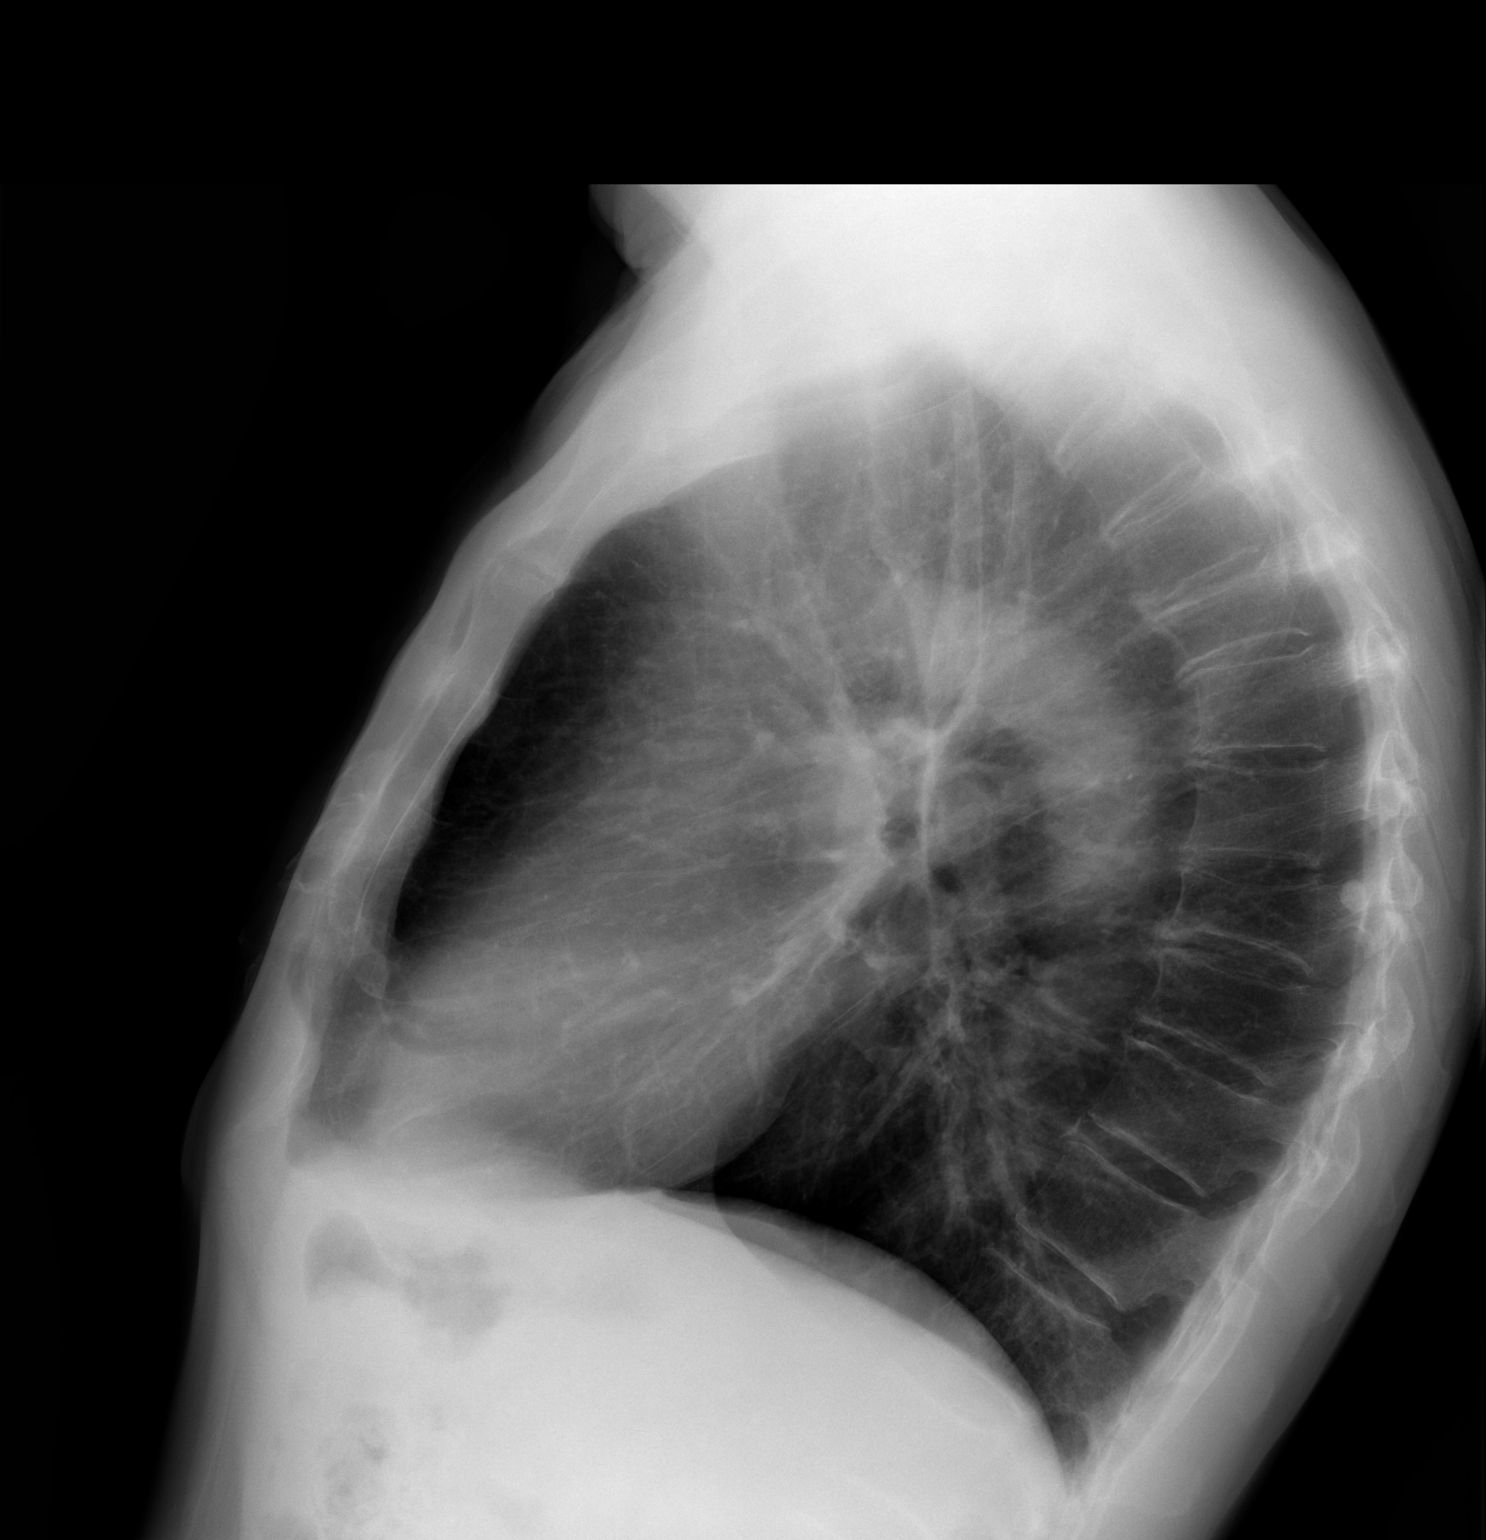

[2 of 2 positions shown; findings below may reference images not displayed]

FINDINGS: Cardiac shadow is within normal limits. The lungs are again mildly
hyperinflated. A calcified granuloma is noted in the right lower
lobe posteriorly. No focal infiltrate or sizable effusion is seen.
Mid thoracic compression deformities are again noted.
IMPRESSION: Prior granulomatous disease.

COPD without acute abnormality.

## 2016-10-29 ENCOUNTER — Ambulatory Visit
Admission: RE | Admit: 2016-10-29 | Discharge: 2016-10-29 | Disposition: A | Discharge status: Home / Self Care | Payer: 59 | Source: Ambulatory Visit | Attending: Otolaryngology | Admitting: Otolaryngology

## 2016-10-29 DIAGNOSIS — K219 Gastro-esophageal reflux disease without esophagitis: Secondary | ICD-10-CM

## 2016-10-29 DIAGNOSIS — R1314 Dysphagia, pharyngoesophageal phase: Secondary | ICD-10-CM

## 2016-11-12 ENCOUNTER — Encounter: Payer: Self-pay | Admitting: Gastroenterology

## 2016-12-08 ENCOUNTER — Telehealth: Payer: Self-pay | Admitting: *Deleted

## 2016-12-08 NOTE — Telephone Encounter (Signed)
OK to hold Plavix for colonoscopy.  

## 2016-12-08 NOTE — Telephone Encounter (Signed)
I will fax to [facility].

## 2016-12-08 NOTE — Telephone Encounter (Signed)
Request for surgical clearance:  1. What type of surgery is being performed? colonoscopy  2. When is this surgery scheduled? June 7,2018  3. Are there any medications that need to be held prior to surgery and how long?  Plavix Can this patient temporarily stop the above medication before his/her procedure, or after the procedure if polyps are removed.  Please list you recommendations below: Stop Date:_______________ Restart Date (if polyps removed) __________________  4. Name of physician performing surgery? [facility]  5. What is your office phone and fax number?   Phone [phone] Fax [phone]

## 2016-12-11 ENCOUNTER — Ambulatory Visit: Payer: BLUE CROSS/BLUE SHIELD | Admitting: Gastroenterology

## 2017-02-12 ENCOUNTER — Encounter (HOSPITAL_COMMUNITY): Payer: Self-pay | Admitting: Cardiology

## 2017-02-12 ENCOUNTER — Ambulatory Visit (HOSPITAL_COMMUNITY)
Admission: RE | Admit: 2017-02-12 | Discharge: 2017-02-12 | Disposition: A | Discharge status: Home / Self Care | Payer: BLUE CROSS/BLUE SHIELD | Source: Ambulatory Visit | Attending: Cardiology | Admitting: Cardiology

## 2017-02-12 VITALS — BP 142/98 | HR 92 | Ht 70.0 in | Wt 169.1 lb

## 2017-02-12 DIAGNOSIS — I252 Old myocardial infarction: Secondary | ICD-10-CM | POA: Diagnosis not present

## 2017-02-12 DIAGNOSIS — Z7982 Long term (current) use of aspirin: Secondary | ICD-10-CM | POA: Insufficient documentation

## 2017-02-12 DIAGNOSIS — Z79899 Other long term (current) drug therapy: Secondary | ICD-10-CM | POA: Diagnosis not present

## 2017-02-12 DIAGNOSIS — I10 Essential (primary) hypertension: Secondary | ICD-10-CM | POA: Diagnosis not present

## 2017-02-12 DIAGNOSIS — E784 Other hyperlipidemia: Secondary | ICD-10-CM | POA: Diagnosis not present

## 2017-02-12 DIAGNOSIS — J45909 Unspecified asthma, uncomplicated: Secondary | ICD-10-CM | POA: Insufficient documentation

## 2017-02-12 DIAGNOSIS — Z85038 Personal history of other malignant neoplasm of large intestine: Secondary | ICD-10-CM | POA: Diagnosis not present

## 2017-02-12 DIAGNOSIS — I255 Ischemic cardiomyopathy: Secondary | ICD-10-CM | POA: Diagnosis not present

## 2017-02-12 DIAGNOSIS — K219 Gastro-esophageal reflux disease without esophagitis: Secondary | ICD-10-CM | POA: Insufficient documentation

## 2017-02-12 DIAGNOSIS — Z7902 Long term (current) use of antithrombotics/antiplatelets: Secondary | ICD-10-CM | POA: Diagnosis not present

## 2017-02-12 DIAGNOSIS — E785 Hyperlipidemia, unspecified: Secondary | ICD-10-CM | POA: Insufficient documentation

## 2017-02-12 DIAGNOSIS — E782 Mixed hyperlipidemia: Secondary | ICD-10-CM | POA: Diagnosis not present

## 2017-02-12 DIAGNOSIS — E7849 Other hyperlipidemia: Secondary | ICD-10-CM

## 2017-02-12 DIAGNOSIS — I2510 Atherosclerotic heart disease of native coronary artery without angina pectoris: Secondary | ICD-10-CM | POA: Insufficient documentation

## 2017-02-12 LAB — BASIC METABOLIC PANEL
Anion gap: 7 (ref 5–15)
BUN: 14 mg/dL (ref 6–20)
CHLORIDE: 109 mmol/L (ref 101–111)
CO2: 23 mmol/L (ref 22–32)
CREATININE: 0.8 mg/dL (ref 0.61–1.24)
Calcium: 9.1 mg/dL (ref 8.9–10.3)
GFR calc non Af Amer: >60 mL/min (ref >60)
Glucose, Bld: 93 mg/dL (ref 65–99)
Potassium: 3.9 mmol/L (ref 3.5–5.1)
Sodium: 139 mmol/L (ref 135–145)

## 2017-02-12 LAB — LIPID PANEL
CHOL/HDL RATIO: 3.9 ratio
Cholesterol: 139 mg/dL (ref 0–200)
HDL: 36 mg/dL — AB (ref >40)
LDL Cholesterol: 86 mg/dL (ref 0–99)
Triglycerides: 83 mg/dL (ref <150)
VLDL: 17 mg/dL (ref 0–40)

## 2017-02-12 LAB — CBC
HCT: 45.4 % (ref 39.0–52.0)
Hemoglobin: 14.9 g/dL (ref 13.0–17.0)
MCH: 29 pg (ref 26.0–34.0)
MCHC: 32.8 g/dL (ref 30.0–36.0)
MCV: 88.5 fL (ref 78.0–100.0)
PLATELETS: 154 10*3/uL (ref 150–400)
RBC: 5.13 MIL/uL (ref 4.22–5.81)
RDW: 13.3 % (ref 11.5–15.5)
WBC: 5.4 10*3/uL (ref 4.0–10.5)

## 2017-02-12 MED ORDER — FLUTICASONE PROPIONATE HFA 110 MCG/ACT IN AERO: 1.0000 | INHALATION_SPRAY | Freq: Two times a day (BID) | RESPIRATORY_TRACT | 12 refills | Status: DC

## 2017-02-12 MED ORDER — BISOPROLOL FUMARATE 5 MG PO TABS: 5.0000 mg | ORAL_TABLET | Freq: Every day | ORAL | 3 refills | Status: DC

## 2017-02-12 NOTE — Patient Instructions (Signed)
Labs today (will call for abnormal results, otherwise no news is good news)  STOP taking Coreg  START taking bisoprolol 5 mg (1 Tablet) Once Daily  Record blood pressure readings daily for 2 weeks and call us with your readiungs.  Call [phone] press Option 5  Follow up in 6 months, we will contact you to schedule appointment.

## 2017-02-14 NOTE — Progress Notes (Signed)
Patient ID: Jerry Montoya, male   DOB: [birth year], 61 y.o.   MRN: [id number] PCP: Dr. Felipa Montoya Cardiology: Dr. Aundra Montoya  61 yo with history of asthma and colon cancer was admitted in 10/15 with anterior STEMI.  He had fresh total occlusion of the mid LAD and chronic total occlusion of the mid LCx with collaterals.  He had Xience DES to LAD.  Echo that admission showed EF 45-50%.  Repeat echo in 1/16 showed EF up to 55-60%.  In 5/17, he had a Cardiolite that was low risk with a fixed apical inferior defect and no ischemia.   About 6 weeks ago, he had an episode of left-sided chest pain lasting 30 minutes.  He did not use NTG.  Pain was not exertional, no definite trigger.  He has not had any recurrent episodes.  He walks about 3 miles/day with his dog.  No exertional dyspnea though he says that he feels like he fatigues more easily than in the past.  Weight is stable.  He has stable dyspnea => worse in spring and summer, suspects this is due to his chronic asthma.  He wheezes at times.  BP is mildly elevated today.   ECG (personally reviewed): NSR, normal  Labs (10/15): K 3.9, creatinine 0.81 => 1.0, LDL 129 Labs (10/16): K 3.9, creatinine 0.89, LDL 125, HDL 30 Labs (5/17): LDL 77, HDl 31, K 4.2, creatinine 0.9  PMH: 1. Asthma: since his 63s.  2. Colon cancer: s/p sigmoid colectomy in 2015.  3. Hyperlipidemia 4. GERD 5. CAD: Anterior STEMI in 10/15, LHC showed total occlusion mid LAD and chronic total occlusion mid LCx with collaterals.  He had Xience DES to the LAD.   - Cardiolite (5/17) with fixed apical inferior defect, no ischemia.  Low risk study.  EF 58%.  6. Ischemic cardiomyopathy: Echo (10/15) with EF 45-50%, anterior/anteroseptal/apical severe hypokinesis.  Echo (1/16) with EF 55-60%.   SH: Works at [employer], married Jerry Montoya), nonsmoker.   FH: Father with CAD.   ROS: All systems reviewed and negative except as per HPI.   Current Outpatient Prescriptions  Medication  Sig Dispense Refill  . acetaminophen (TYLENOL) 500 MG tablet Take 500 mg by mouth as directed.     [name] Kitchen aspirin EC 81 MG EC tablet Take 1 tablet (81 mg total) by mouth daily.    . Coenzyme Q10 (COQ10) 200 MG CAPS Take 1 tablet by mouth daily.     [name] Kitchen EPIPEN 2-PAK 0.3 MG/0.3ML SOAJ injection Inject 0.3 mg into the skin once.   0  . fluticasone (FLONASE) 50 MCG/ACT nasal spray Place 1 spray into both nostrils daily as needed for allergies.   0  . levalbuterol (XOPENEX HFA) 45 MCG/ACT inhaler Inhale 1-2 puffs into the lungs every 6 (six) hours as needed for wheezing. 1 Inhaler 12  . nitroGLYCERIN (NITROSTAT) 0.4 MG SL tablet Place 1 tablet (0.4 mg total) under the tongue every 5 (five) minutes x 3 doses as needed for chest pain. 25 tablet 12  . pantoprazole (PROTONIX) 40 MG tablet Take 1 tablet (40 mg total) by mouth daily. 90 tablet 3  . rosuvastatin (CRESTOR) 20 MG tablet Take 1 tablet (20 mg total) by mouth daily. Please call and schedule an appointment 90 tablet 1  . bisoprolol (ZEBETA) 5 MG tablet Take 1 tablet (5 mg total) by mouth daily. 30 tablet 3  . clopidogrel (PLAVIX) 75 MG tablet Take 1 tablet (75 mg total) by mouth daily. Please call  and schedule an appointment 90 tablet 1  . fluticasone (FLOVENT HFA) 110 MCG/ACT inhaler Inhale 1 puff into the lungs 2 (two) times daily. 1 Inhaler 12  . lisinopril (PRINIVIL,ZESTRIL) 2.5 MG tablet Take 1 tablet (2.5 mg total) by mouth daily. (Patient not taking: Reported on 02/12/2017) 90 tablet 1   No current facility-administered medications for this encounter.     BP (!) 142/98 (BP Location: Right Arm, Patient Position: Sitting, Cuff Size: Normal)   Pulse 92   Ht 5\' 10"  (1.778 m)   Wt 169 lb 1.9 oz (76.7 kg)   SpO2 98%   BMI 24.27 kg/m  General: NAD Neck: No JVD, no thyromegaly or thyroid nodule.  Lungs: Mild basilar crackles CV: Nondisplaced PMI.  Heart regular S1/S2, no S3/S4, no murmur.  No peripheral edema.  No carotid bruit.  Normal pedal  pulses.  Abdomen: Soft, nontender, no hepatosplenomegaly, no distention.  Skin: Intact without lesions or rashes.  Neurologic: Alert and oriented x 3.  Psych: Normal affect. Extremities: No clubbing or cyanosis.  HEENT: Normal.   Assessment/Plan: 1. CAD: s/p anterior STEMI 10/15 with DES to mLAD.  Also had CTO mid LCx with collaterals that was not treated percutaneously.  Last Cardiolite in 5/17 was low risk with no ischemia. He had an episode of chest pain about 6 months ago that was not related to exertion, none since that time.  - DAPT score = 2 so would be reasonable to continue Plavix for long-term treatment.    - Continue statin, lisinopril, ASA 81 daily.  - Given significant asthma, will stop Coreg and start bisoprolol (more beta-1 selective).  - Hold off on repeat Cardiolite with no recent chest pain.    2. Ischemic cardiomyopathy: EF 45-50% on initial echo, improved to 55-60% in 1/16.  Not volume overloaded on exam. - Continue beta blocker and lisinopril. 3. Hyperlipidemia: Continue Crestor, check lipids today.  4. HTN: BP mildly elevated today.  Has been ok at home in the past.  He will check it daily x 2 weeks and then call in with readings for me. Goal SBP < 130 based on newest guidelines.  5. Asthma: He occasionally wheezes.  Think most of his periodic dyspnea is related to asthma.   Followup in 6 months.  Jerry Montoya 02/14/2017

## 2017-02-19 ENCOUNTER — Ambulatory Visit
Admission: RE | Admit: 2017-02-19 | Discharge: 2017-02-19 | Disposition: A | Discharge status: Home / Self Care | Payer: BLUE CROSS/BLUE SHIELD | Source: Ambulatory Visit | Attending: Nurse Practitioner | Admitting: Nurse Practitioner

## 2017-02-19 ENCOUNTER — Other Ambulatory Visit: Payer: Self-pay | Admitting: Nurse Practitioner

## 2017-02-19 DIAGNOSIS — R591 Generalized enlarged lymph nodes: Secondary | ICD-10-CM

## 2017-02-23 ENCOUNTER — Other Ambulatory Visit: Payer: Self-pay | Admitting: Nurse Practitioner

## 2017-02-23 ENCOUNTER — Telehealth (HOSPITAL_COMMUNITY): Payer: Self-pay | Admitting: *Deleted

## 2017-02-23 DIAGNOSIS — I255 Ischemic cardiomyopathy: Secondary | ICD-10-CM

## 2017-02-23 DIAGNOSIS — E7849 Other hyperlipidemia: Secondary | ICD-10-CM

## 2017-02-23 DIAGNOSIS — I2510 Atherosclerotic heart disease of native coronary artery without angina pectoris: Secondary | ICD-10-CM

## 2017-02-23 DIAGNOSIS — R591 Generalized enlarged lymph nodes: Secondary | ICD-10-CM

## 2017-02-23 MED ORDER — ROSUVASTATIN CALCIUM 40 MG PO TABS: 40.0000 mg | ORAL_TABLET | Freq: Every day | ORAL | 6 refills | Status: DC

## 2017-02-23 NOTE — Telephone Encounter (Signed)
Notes recorded by Scarlette Calico, RN on 02/23/2017 at 4:58 PM EDT Pt aware and agreeable, he will call us back to sch labs ------  Notes recorded by Darron Doom, RN on 02/16/2017 at 12:23 PM EDT Called and left message asking for him to call us back. ------  Notes recorded by Larey Dresser, MD on 02/12/2017 at 9:58 PM EDT Goal LDL < 70. Increase Crestor to 40 mg daily with lipids/LFTs in 2 months.

## 2017-02-26 ENCOUNTER — Ambulatory Visit
Admission: RE | Admit: 2017-02-26 | Discharge: 2017-02-26 | Disposition: A | Discharge status: Home / Self Care | Payer: BLUE CROSS/BLUE SHIELD | Source: Ambulatory Visit | Attending: Nurse Practitioner | Admitting: Nurse Practitioner

## 2017-02-26 ENCOUNTER — Inpatient Hospital Stay
Admission: RE | Admit: 2017-02-26 | Discharge: 2017-02-26 | Disposition: A | Discharge status: Home / Self Care | Payer: BLUE CROSS/BLUE SHIELD | Source: Ambulatory Visit | Attending: Nurse Practitioner | Admitting: Nurse Practitioner

## 2017-02-26 DIAGNOSIS — R591 Generalized enlarged lymph nodes: Secondary | ICD-10-CM

## 2017-02-26 IMAGING — CT CT NECK W/ CM
2 of 4 series · 6 of 14 positions shown, 7 images · IV contrast (iopamidol)
Comparison: Chest CT [DATE].

CLINICAL DATA: 61-year-old male with right side neck/submandibular
soft tissue swelling and pain. Right ear pressure. Symptoms for 1
month. Patient reports right lower dental implant procedure 1 month
ago. Personal history of colon cancer in [4A].

EXAM:
CT NECK WITH CONTRAST
TECHNIQUE: Multidetector CT imaging of the neck was performed using the
standard protocol following the bolus administration of intravenous
contrast.
CONTRAST:  75mL [4A] IOPAMIDOL ([4A]) INJECTION 61%

[Series 3: neck · axial · 0.49mm/px · z∈[-289,-153]mm · 3 of 138 slices shown, 4 images]
[im 35/138  soft-tissue]
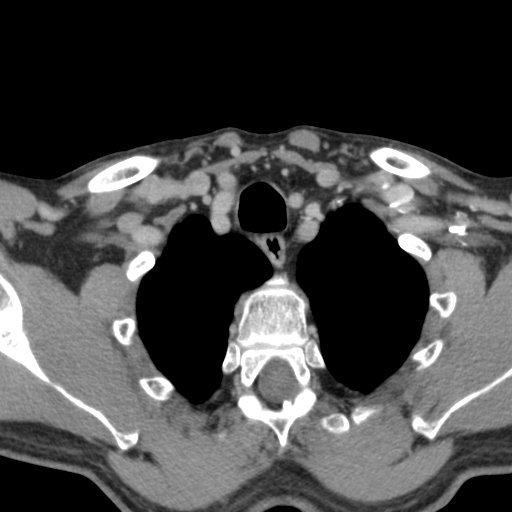
[im 35/138  bone]
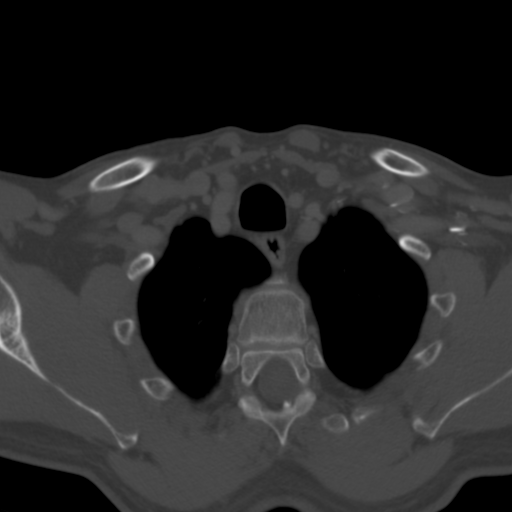
[im 69/138  bone]
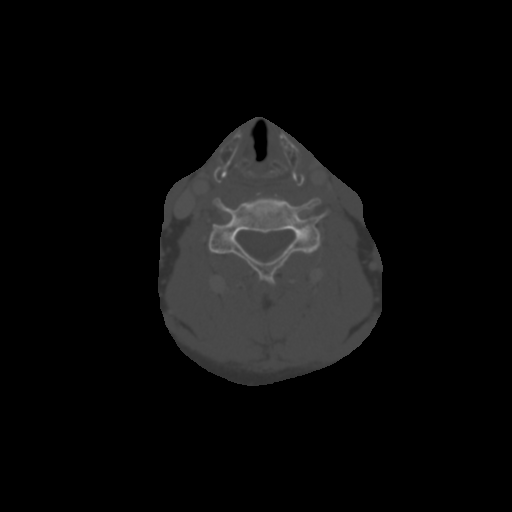
[im 103/138  bone]
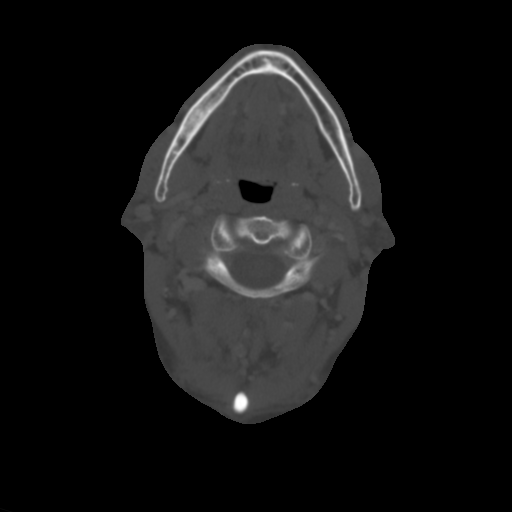

[Series 7: angled axial-oropharynx · axial · 0.39mm/px · z∈[-276,-141]mm · 3 of 137 slices shown]
[im 35/137  bone]
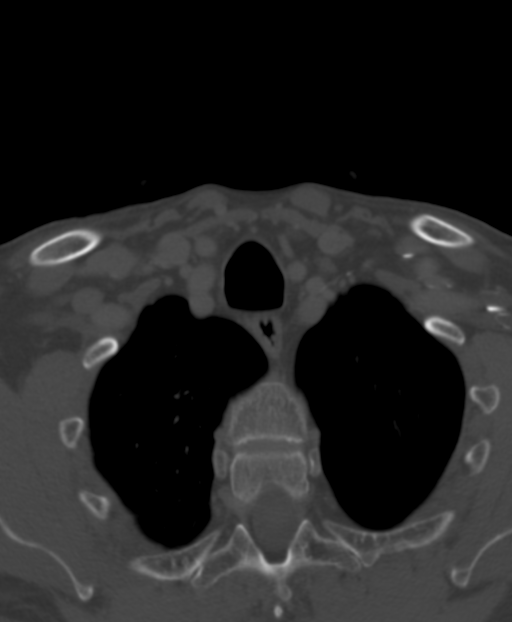
[im 69/137  bone]
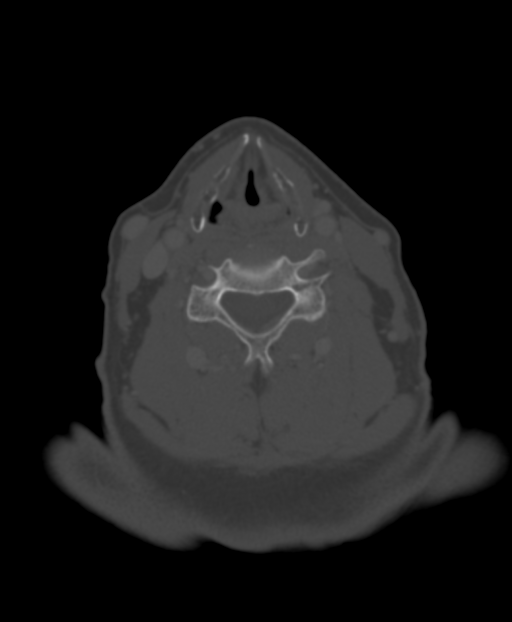
[im 103/137  bone]
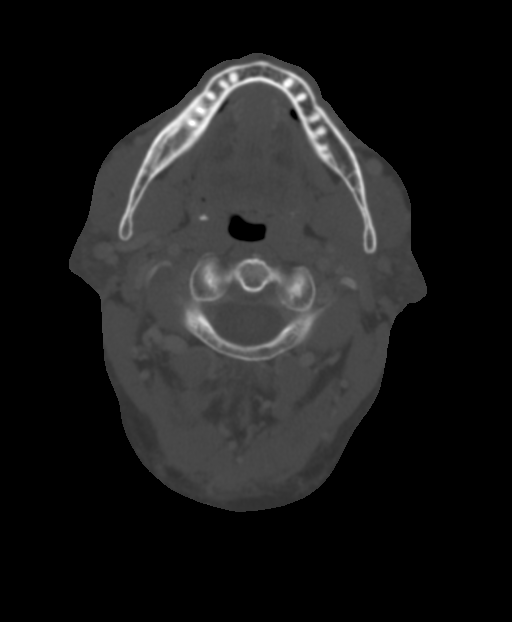

[6 of 14 positions shown; findings below may reference images not displayed]

FINDINGS: Pharynx and larynx: Negative larynx. Pharyngeal soft tissue contours
are within normal limits. There are small postinflammatory
calcifications at both palatine tonsils. Negative parapharyngeal and
retropharyngeal spaces.

Salivary glands: Negative sublingual space. Both submandibular
glands are within normal limits. No inflammation or sialolithiasis
identified. Bilateral parotid glands are within normal limits.

Thyroid: Negative.

Lymph nodes: An area of clinical concern is marked along the
posterior right neck on series 3, image 41, right level 5 nodal
station region. There is no regional mass, lymphadenopathy, or
abnormality. See also coronal image 79.

Bilateral cervical lymph nodes are within normal limits. The largest
lymph nodes on the left are at the level IIa nodal station measuring
up to 6 mm short axis. The largest lymph nodes on the right are at
the level 2 and level 3 nodal stations measuring up to 6-7 mm
(series 3, image 78).

Vascular: Major vascular structures in the neck and at the skullbase
are patent. Minimal left carotid bifurcation calcified plaque.

Limited intracranial: Negative.

Visualized orbits: Not included.

Mastoids and visualized paranasal sinuses: Right tympanic cavity and
mastoids are clear and appear normal. The left tympanic cavity,
mastoids, and the visible bilateral paranasal sinuses are clear.

Skeleton: Mild for age cervical spine degeneration. No acute or
suspicious lesion identified at the skullbase or in the visible
spine.

There is asymmetric sclerosis of the right mandible at the level of
the posterior body of the mandible (series 4, image 35), but no
associated mandible fracture, bone erosion, periosteal reaction, or
surrounding soft tissue inflammation. Nearby a right mandible molar
region dental implant is noted. Also an adjacent molar demonstrates
changes of prior root canal.

Upper chest: Respiratory motion artifact. Stable and give the lung
parenchyma (partially visible 9 mm calcified granuloma in the
superior segment of the right lower lobe. Negative visualized
mediastinum. No axillary lymphadenopathy.
IMPRESSION: 1. No abnormality corresponding to the palpable area of concern. No
neck mass, lymphadenopathy or inflammatory process. The right middle
ear appears normal.
2. Nonspecific asymmetric sclerosis of the right mandible, but this
appears to be postoperative or postinflammatory in nature, with
absence of any acute or suspicious features. If right mandible
region symptoms continue a follow-up noncontrast Face CT could be
utilized to evaluate stability.

## 2017-02-26 MED ORDER — IOPAMIDOL (ISOVUE-300) INJECTION 61%
75.0000 mL | Freq: Once | INTRAVENOUS | Status: AC | PRN
  Administered 2017-02-26: 75 mL via INTRAVENOUS

## 2017-04-28 ENCOUNTER — Encounter (HOSPITAL_COMMUNITY): Payer: Self-pay | Admitting: *Deleted

## 2017-05-22 ENCOUNTER — Other Ambulatory Visit (HOSPITAL_COMMUNITY): Payer: Self-pay | Admitting: Cardiology

## 2017-06-02 ENCOUNTER — Other Ambulatory Visit: Payer: Self-pay | Admitting: Cardiology

## 2017-06-02 DIAGNOSIS — I255 Ischemic cardiomyopathy: Secondary | ICD-10-CM

## 2017-06-02 DIAGNOSIS — I2510 Atherosclerotic heart disease of native coronary artery without angina pectoris: Secondary | ICD-10-CM

## 2017-07-11 ENCOUNTER — Other Ambulatory Visit (HOSPITAL_COMMUNITY): Payer: Self-pay | Admitting: Cardiology

## 2017-07-16 ENCOUNTER — Other Ambulatory Visit: Payer: Self-pay | Admitting: Student

## 2017-07-16 DIAGNOSIS — I255 Ischemic cardiomyopathy: Secondary | ICD-10-CM

## 2017-07-16 DIAGNOSIS — I2510 Atherosclerotic heart disease of native coronary artery without angina pectoris: Secondary | ICD-10-CM

## 2017-09-01 ENCOUNTER — Other Ambulatory Visit: Payer: Self-pay | Admitting: Cardiology

## 2017-09-01 DIAGNOSIS — I255 Ischemic cardiomyopathy: Secondary | ICD-10-CM

## 2017-09-01 DIAGNOSIS — I2510 Atherosclerotic heart disease of native coronary artery without angina pectoris: Secondary | ICD-10-CM

## 2017-11-02 ENCOUNTER — Other Ambulatory Visit: Payer: Self-pay | Admitting: Internal Medicine

## 2017-11-02 DIAGNOSIS — I2510 Atherosclerotic heart disease of native coronary artery without angina pectoris: Secondary | ICD-10-CM

## 2017-11-02 DIAGNOSIS — I255 Ischemic cardiomyopathy: Secondary | ICD-10-CM

## 2018-01-01 ENCOUNTER — Ambulatory Visit (HOSPITAL_COMMUNITY)
Admission: RE | Admit: 2018-01-01 | Discharge: 2018-01-01 | Disposition: A | Discharge status: Home / Self Care | Payer: 59 | Source: Ambulatory Visit | Attending: Cardiology | Admitting: Cardiology

## 2018-01-01 VITALS — BP 110/64 | HR 65 | Wt 164.5 lb

## 2018-01-01 DIAGNOSIS — R0789 Other chest pain: Secondary | ICD-10-CM | POA: Diagnosis not present

## 2018-01-01 DIAGNOSIS — J45909 Unspecified asthma, uncomplicated: Secondary | ICD-10-CM | POA: Insufficient documentation

## 2018-01-01 DIAGNOSIS — I255 Ischemic cardiomyopathy: Secondary | ICD-10-CM

## 2018-01-01 DIAGNOSIS — E782 Mixed hyperlipidemia: Secondary | ICD-10-CM

## 2018-01-01 DIAGNOSIS — Z85038 Personal history of other malignant neoplasm of large intestine: Secondary | ICD-10-CM | POA: Diagnosis not present

## 2018-01-01 DIAGNOSIS — I252 Old myocardial infarction: Secondary | ICD-10-CM | POA: Diagnosis not present

## 2018-01-01 DIAGNOSIS — Z7902 Long term (current) use of antithrombotics/antiplatelets: Secondary | ICD-10-CM | POA: Insufficient documentation

## 2018-01-01 DIAGNOSIS — Z7982 Long term (current) use of aspirin: Secondary | ICD-10-CM | POA: Diagnosis not present

## 2018-01-01 DIAGNOSIS — E785 Hyperlipidemia, unspecified: Secondary | ICD-10-CM | POA: Diagnosis not present

## 2018-01-01 DIAGNOSIS — Z79899 Other long term (current) drug therapy: Secondary | ICD-10-CM | POA: Insufficient documentation

## 2018-01-01 DIAGNOSIS — R0683 Snoring: Secondary | ICD-10-CM | POA: Diagnosis not present

## 2018-01-01 DIAGNOSIS — I2510 Atherosclerotic heart disease of native coronary artery without angina pectoris: Secondary | ICD-10-CM | POA: Diagnosis not present

## 2018-01-01 DIAGNOSIS — Z7951 Long term (current) use of inhaled steroids: Secondary | ICD-10-CM | POA: Insufficient documentation

## 2018-01-01 DIAGNOSIS — I10 Essential (primary) hypertension: Secondary | ICD-10-CM | POA: Insufficient documentation

## 2018-01-01 DIAGNOSIS — K219 Gastro-esophageal reflux disease without esophagitis: Secondary | ICD-10-CM | POA: Insufficient documentation

## 2018-01-01 DIAGNOSIS — I2582 Chronic total occlusion of coronary artery: Secondary | ICD-10-CM | POA: Insufficient documentation

## 2018-01-01 LAB — BASIC METABOLIC PANEL
Anion gap: 4 — ABNORMAL LOW (ref 5–15)
BUN: 16 mg/dL (ref 6–20)
CHLORIDE: 109 mmol/L (ref 101–111)
CO2: 25 mmol/L (ref 22–32)
Calcium: 8.9 mg/dL (ref 8.9–10.3)
Creatinine, Ser: 0.84 mg/dL (ref 0.61–1.24)
Glucose, Bld: 100 mg/dL — ABNORMAL HIGH (ref 65–99)
POTASSIUM: 4 mmol/L (ref 3.5–5.1)
SODIUM: 138 mmol/L (ref 135–145)

## 2018-01-01 LAB — LIPID PANEL
Cholesterol: 128 mg/dL (ref 0–200)
HDL: 34 mg/dL — ABNORMAL LOW (ref >40)
LDL Cholesterol: 82 mg/dL (ref 0–99)
Total CHOL/HDL Ratio: 3.8 RATIO
Triglycerides: 62 mg/dL (ref <150)
VLDL: 12 mg/dL (ref 0–40)

## 2018-01-01 LAB — HEMOGLOBIN A1C
HEMOGLOBIN A1C: 5.4 % (ref 4.8–5.6)
MEAN PLASMA GLUCOSE: 108.28 mg/dL

## 2018-01-01 MED ORDER — BISOPROLOL FUMARATE 5 MG PO TABS: 5.0000 mg | ORAL_TABLET | Freq: Every day | ORAL | 3 refills | Status: DC

## 2018-01-01 MED ORDER — ROSUVASTATIN CALCIUM 40 MG PO TABS: 40.0000 mg | ORAL_TABLET | Freq: Every day | ORAL | 3 refills | Status: DC

## 2018-01-01 MED ORDER — PANTOPRAZOLE SODIUM 40 MG PO TBEC: 40.0000 mg | DELAYED_RELEASE_TABLET | Freq: Every day | ORAL | 3 refills | Status: DC

## 2018-01-01 MED ORDER — CLOPIDOGREL BISULFATE 75 MG PO TABS: 75.0000 mg | ORAL_TABLET | Freq: Every day | ORAL | 3 refills | Status: DC

## 2018-01-01 MED ORDER — ASPIRIN EC 81 MG PO TBEC: 81.0000 mg | DELAYED_RELEASE_TABLET | Freq: Every day | ORAL | 3 refills | Status: DC

## 2018-01-01 MED ORDER — LISINOPRIL 5 MG PO TABS: 2.5000 mg | ORAL_TABLET | Freq: Every day | ORAL | 3 refills | Status: DC

## 2018-01-01 NOTE — Progress Notes (Signed)
Patient ID: Jerry Montoya, male   DOB: [birth year], 62 y.o.   MRN: [id number] PCP: Dr. Felipa Eth Cardiology: Dr. Aundra Dubin  62 y.o. with history of asthma and colon cancer was admitted in 10/15 with anterior STEMI.  He had fresh total occlusion of the mid LAD and chronic total occlusion of the mid LCx with collaterals.  He had Xience DES to LAD.  Echo that admission showed EF 45-50%.  Repeat echo in 1/16 showed EF up to 55-60%.  In 5/17, he had a Cardiolite that was low risk with a fixed apical inferior defect and no ischemia.   He returns for followup of CAD.  He is not taking ASA or lisinopril, not sure why.  Occasional "twinge" of chest pain, lasts seconds, not exertional.  He has occasional dyspnea that he attributes to asthma, also not related to exertion.  Generalized fatigue.  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  He snores at night but denies daytime sleepiness.   ECG (personally reviewed): NSR, normal  Labs (10/15): K 3.9, creatinine 0.81 => 1.0, LDL 129 Labs (10/16): K 3.9, creatinine 0.89, LDL 125, HDL 30 Labs (5/17): LDL 77, HDl 31, K 4.2, creatinine 0.9 Labs (10/18): LDL 82  PMH: 1. Asthma: since his 75s.  2. Colon cancer: s/p sigmoid colectomy in 2015.  3. Hyperlipidemia 4. GERD 5. CAD: Anterior STEMI in 10/15, LHC showed total occlusion mid LAD and chronic total occlusion mid LCx with collaterals.  He had Xience DES to the LAD.   - Cardiolite (5/17) with fixed apical inferior defect, no ischemia.  Low risk study.  EF 58%.  6. Ischemic cardiomyopathy: Echo (10/15) with EF 45-50%, anterior/anteroseptal/apical severe hypokinesis.  Echo (1/16) with EF 55-60%.   SH: [employer], married Jerry Montoya), nonsmoker.   FH: Father with CAD.   ROS: All systems reviewed and negative except as per HPI.   Current Outpatient Medications  Medication Sig Dispense Refill  . acetaminophen (TYLENOL) 500 MG tablet Take 500 mg by mouth as directed.     [name] Kitchen EPIPEN 2-PAK 0.3 MG/0.3ML SOAJ  injection Inject 0.3 mg into the skin once.   0  . fluticasone (FLONASE) 50 MCG/ACT nasal spray Place 1 spray into both nostrils daily as needed for allergies.   0  . fluticasone (FLOVENT HFA) 110 MCG/ACT inhaler Inhale 1 puff into the lungs 2 (two) times daily. 1 Inhaler 12  . levalbuterol (XOPENEX HFA) 45 MCG/ACT inhaler Inhale 1-2 puffs into the lungs every 6 (six) hours as needed for wheezing. 1 Inhaler 12  . nitroGLYCERIN (NITROSTAT) 0.4 MG SL tablet Place 1 tablet (0.4 mg total) under the tongue every 5 (five) minutes x 3 doses as needed for chest pain. 25 tablet 12  . aspirin EC 81 MG tablet Take 1 tablet (81 mg total) by mouth daily. 90 tablet 3  . bisoprolol (ZEBETA) 5 MG tablet Take 1 tablet (5 mg total) by mouth daily. 90 tablet 3  . clopidogrel (PLAVIX) 75 MG tablet Take 1 tablet (75 mg total) by mouth daily. 90 tablet 3  . lisinopril (PRINIVIL,ZESTRIL) 5 MG tablet Take 0.5 tablets (2.5 mg total) by mouth daily. 45 tablet 3  . pantoprazole (PROTONIX) 40 MG tablet Take 1 tablet (40 mg total) by mouth daily. 90 tablet 3  . rosuvastatin (CRESTOR) 40 MG tablet Take 1 tablet (40 mg total) by mouth daily. 90 tablet 3   No current facility-administered medications for this encounter.     BP 110/64  Pulse 65   Wt 164 lb 8 oz (74.6 kg)   SpO2 96%   BMI 23.60 kg/m  General: NAD Neck: No JVD, no thyromegaly or thyroid nodule.  Lungs: Clear to auscultation bilaterally with normal respiratory effort. CV: Nondisplaced PMI.  Heart regular S1/S2, no S3/S4, no murmur.  No peripheral edema.  No carotid bruit.  Normal pedal pulses.  Abdomen: Soft, nontender, no hepatosplenomegaly, no distention.  Skin: Intact without lesions or rashes.  Neurologic: Alert and oriented x 3.  Psych: Normal affect. Extremities: No clubbing or cyanosis.  HEENT: Normal.   Assessment/Plan: 1. CAD: s/p anterior STEMI 10/15 with DES to mLAD.  Also had CTO mid LCx with collaterals that was not treated  percutaneously.  Last Cardiolite in 5/17 was low risk with no ischemia. Rare atypical chest pain. - DAPT score = 2 so would be reasonable to continue Plavix for long-term treatment.    - Continue statin.  He is actually only taking Crestor 20 mg daily, need lipid check today.  - He needs to restart ASA 81 and lisinopril 2.5 daily.  - Continue bisoprolol.   2. Ischemic cardiomyopathy: EF 45-50% on initial echo, improved to 55-60% in 1/16.  Not volume overloaded on exam. - Continue beta blocker and restart lisinopril.  3. Hyperlipidemia: Continue Crestor, check lipids today. He is actually only taking Crestor 20 mg daily so may need to increase.  4. HTN: BP controlled.   5. Asthma: He occasionally wheezes.  Think most of his periodic dyspnea is related to asthma.  6. Snoring: Does not want sleep study.   Followup in 1 year  Loralie Champagne 01/01/2018

## 2018-01-01 NOTE — Patient Instructions (Signed)
Start Aspirin 81 mg daily  Start Lisinopril 2.5 mg (1/2 tab) daily  Labs today  We will contact you in 1 year to schedule your next appointment.

## 2018-01-06 ENCOUNTER — Other Ambulatory Visit: Payer: Self-pay | Admitting: Cardiology

## 2018-01-06 DIAGNOSIS — I2510 Atherosclerotic heart disease of native coronary artery without angina pectoris: Secondary | ICD-10-CM

## 2018-01-06 DIAGNOSIS — I255 Ischemic cardiomyopathy: Secondary | ICD-10-CM

## 2018-01-18 ENCOUNTER — Encounter (HOSPITAL_COMMUNITY): Payer: Self-pay

## 2018-01-18 ENCOUNTER — Telehealth (HOSPITAL_COMMUNITY): Payer: Self-pay

## 2018-01-18 NOTE — Telephone Encounter (Signed)
Notes recorded by Shirley Muscat, RN on 01/18/2018 at 12:35 PM EDT I have been unable to reach this patient by phone. A letter is being sent.  ------  Notes recorded by Shirley Muscat, RN on 01/14/2018 at 5:00 PM EDT Left message to call back   ------  Notes recorded by Shirley Muscat, RN on 01/07/2018 at 2:27 PM EDT Left message to call back   ------  Notes recorded by Larey Dresser, MD on 01/01/2018 at 4:11 PM EDT Goal LDL < 70, increase Crestor to 40 mg daily with lipids/LFTs in 2 months.

## 2018-02-22 DIAGNOSIS — R293 Abnormal posture: Secondary | ICD-10-CM | POA: Diagnosis not present

## 2018-02-22 DIAGNOSIS — M545 Low back pain: Secondary | ICD-10-CM | POA: Diagnosis not present

## 2018-02-22 DIAGNOSIS — M2560 Stiffness of unspecified joint, not elsewhere classified: Secondary | ICD-10-CM | POA: Diagnosis not present

## 2018-02-24 DIAGNOSIS — M545 Low back pain: Secondary | ICD-10-CM | POA: Diagnosis not present

## 2018-02-24 DIAGNOSIS — R293 Abnormal posture: Secondary | ICD-10-CM | POA: Diagnosis not present

## 2018-02-24 DIAGNOSIS — M2560 Stiffness of unspecified joint, not elsewhere classified: Secondary | ICD-10-CM | POA: Diagnosis not present

## 2018-03-19 ENCOUNTER — Other Ambulatory Visit (HOSPITAL_COMMUNITY): Payer: Self-pay

## 2018-03-19 MED ORDER — NITROGLYCERIN 0.4 MG SL SUBL: 0.4000 mg | SUBLINGUAL_TABLET | SUBLINGUAL | 5 refills | Status: DC | PRN

## 2018-05-17 ENCOUNTER — Telehealth (HOSPITAL_COMMUNITY): Payer: Self-pay | Admitting: *Deleted

## 2018-05-17 DIAGNOSIS — Z79899 Other long term (current) drug therapy: Secondary | ICD-10-CM | POA: Diagnosis not present

## 2018-05-17 DIAGNOSIS — E7800 Pure hypercholesterolemia, unspecified: Secondary | ICD-10-CM | POA: Diagnosis not present

## 2018-05-17 DIAGNOSIS — Z23 Encounter for immunization: Secondary | ICD-10-CM | POA: Diagnosis not present

## 2018-05-17 DIAGNOSIS — K909 Intestinal malabsorption, unspecified: Secondary | ICD-10-CM | POA: Diagnosis not present

## 2018-05-17 DIAGNOSIS — Z0000 Encounter for general adult medical examination without abnormal findings: Secondary | ICD-10-CM | POA: Diagnosis not present

## 2018-05-17 NOTE — Telephone Encounter (Signed)
3:20 pm appointment tomorrow please.

## 2018-05-17 NOTE — Telephone Encounter (Signed)
Dr.Stonekings office called requesting pt be seen by Dr.McLean for dyspnea and chest pain. Pt does not want to see APP clinic or have a nurse visit. Pt is aware that Dr.McLean does not have any appointments available this week but he ask that we "work him in". I told him I would need to speak to Dr.McLean to check availability and I would call him back.  Message routed to [place].

## 2018-05-17 NOTE — Telephone Encounter (Signed)
appt sch °

## 2018-05-18 ENCOUNTER — Encounter (HOSPITAL_COMMUNITY): Payer: Self-pay | Admitting: Cardiology

## 2018-05-18 ENCOUNTER — Ambulatory Visit (HOSPITAL_COMMUNITY)
Admission: RE | Admit: 2018-05-18 | Discharge: 2018-05-18 | Disposition: A | Discharge status: Home / Self Care | Payer: 59 | Source: Ambulatory Visit | Attending: Cardiology | Admitting: Cardiology

## 2018-05-18 ENCOUNTER — Other Ambulatory Visit (HOSPITAL_COMMUNITY): Payer: Self-pay

## 2018-05-18 VITALS — BP 99/66 | HR 77 | Wt 160.2 lb

## 2018-05-18 DIAGNOSIS — J45909 Unspecified asthma, uncomplicated: Secondary | ICD-10-CM | POA: Diagnosis not present

## 2018-05-18 DIAGNOSIS — R079 Chest pain, unspecified: Secondary | ICD-10-CM | POA: Insufficient documentation

## 2018-05-18 DIAGNOSIS — I10 Essential (primary) hypertension: Secondary | ICD-10-CM | POA: Diagnosis not present

## 2018-05-18 DIAGNOSIS — R0683 Snoring: Secondary | ICD-10-CM | POA: Diagnosis not present

## 2018-05-18 DIAGNOSIS — I2510 Atherosclerotic heart disease of native coronary artery without angina pectoris: Secondary | ICD-10-CM

## 2018-05-18 DIAGNOSIS — Z79899 Other long term (current) drug therapy: Secondary | ICD-10-CM | POA: Diagnosis not present

## 2018-05-18 DIAGNOSIS — I208 Other forms of angina pectoris: Secondary | ICD-10-CM

## 2018-05-18 DIAGNOSIS — Z7982 Long term (current) use of aspirin: Secondary | ICD-10-CM | POA: Insufficient documentation

## 2018-05-18 DIAGNOSIS — Z9049 Acquired absence of other specified parts of digestive tract: Secondary | ICD-10-CM | POA: Diagnosis not present

## 2018-05-18 DIAGNOSIS — E785 Hyperlipidemia, unspecified: Secondary | ICD-10-CM | POA: Diagnosis not present

## 2018-05-18 DIAGNOSIS — Z85038 Personal history of other malignant neoplasm of large intestine: Secondary | ICD-10-CM | POA: Insufficient documentation

## 2018-05-18 DIAGNOSIS — I255 Ischemic cardiomyopathy: Secondary | ICD-10-CM | POA: Diagnosis not present

## 2018-05-18 DIAGNOSIS — Z8249 Family history of ischemic heart disease and other diseases of the circulatory system: Secondary | ICD-10-CM | POA: Insufficient documentation

## 2018-05-18 DIAGNOSIS — R0600 Dyspnea, unspecified: Secondary | ICD-10-CM | POA: Diagnosis not present

## 2018-05-18 DIAGNOSIS — I252 Old myocardial infarction: Secondary | ICD-10-CM | POA: Insufficient documentation

## 2018-05-18 DIAGNOSIS — Z7902 Long term (current) use of antithrombotics/antiplatelets: Secondary | ICD-10-CM | POA: Diagnosis not present

## 2018-05-18 DIAGNOSIS — K219 Gastro-esophageal reflux disease without esophagitis: Secondary | ICD-10-CM | POA: Diagnosis not present

## 2018-05-18 LAB — BASIC METABOLIC PANEL
Anion gap: 6 (ref 5–15)
BUN: 19 mg/dL (ref 8–23)
CALCIUM: 9.3 mg/dL (ref 8.9–10.3)
CHLORIDE: 109 mmol/L (ref 98–111)
CO2: 24 mmol/L (ref 22–32)
Creatinine, Ser: 0.94 mg/dL (ref 0.61–1.24)
GFR calc Af Amer: >60 mL/min (ref >60)
GFR calc non Af Amer: >60 mL/min (ref >60)
Glucose, Bld: 107 mg/dL — ABNORMAL HIGH (ref 70–99)
Potassium: 4.2 mmol/L (ref 3.5–5.1)
SODIUM: 139 mmol/L (ref 135–145)

## 2018-05-18 LAB — CBC
HEMATOCRIT: 45.6 % (ref 39.0–52.0)
Hemoglobin: 14.2 g/dL (ref 13.0–17.0)
MCH: 29 pg (ref 26.0–34.0)
MCHC: 31.1 g/dL (ref 30.0–36.0)
MCV: 93.3 fL (ref 80.0–100.0)
Platelets: 175 10*3/uL (ref 150–400)
RBC: 4.89 MIL/uL (ref 4.22–5.81)
RDW: 12.8 % (ref 11.5–15.5)
WBC: 6.8 10*3/uL (ref 4.0–10.5)
nRBC: 0 % (ref 0.0–0.2)

## 2018-05-18 LAB — LIPID PANEL
Cholesterol: 139 mg/dL (ref 0–200)
HDL: 35 mg/dL — ABNORMAL LOW (ref >40)
LDL CALC: 80 mg/dL (ref 0–99)
TRIGLYCERIDES: 119 mg/dL (ref <150)
Total CHOL/HDL Ratio: 4 RATIO
VLDL: 24 mg/dL (ref 0–40)

## 2018-05-18 NOTE — Progress Notes (Signed)
Patient ID: Jerry Montoya, male   DOB: 1955/09/02, 62 y.o.   MRN: [id number] PCP: Dr. Felipa Eth Cardiology: Dr. Aundra Dubin  62 y.o. with history of asthma and colon cancer was admitted in 10/15 with anterior STEMI.  He had fresh total occlusion of the mid LAD and chronic total occlusion of the mid LCx with collaterals.  He had Xience DES to LAD.  Echo that admission showed EF 45-50%.  Repeat echo in 1/16 showed EF up to 55-60%.  In 5/17, he had a Cardiolite that was low risk with a fixed apical inferior defect and no ischemia.   Patient is worked in today for chest pain and dyspnea.  He had been doing very well until about 2 months ago.  At that time, he started to note more fatigue with his usual activities.  This has been gradually progressive.  At this point, he is very fatigued after climbing a flight of stairs, also chest chest pressure climbing a flight of stairs.  He gets exhaustion and chest pressure after walking about a month.  He says he does not "feel right."  He took an NTG once, thinks it helped when he took it.  His job has been stressful lately.  Weight is down 4 lbs.   ECG (personally reviewed): NSR, normal.   Labs (10/15): K 3.9, creatinine 0.81 => 1.0, LDL 129 Labs (10/16): K 3.9, creatinine 0.89, LDL 125, HDL 30 Labs (5/17): LDL 77, HDl 31, K 4.2, creatinine 0.9 Labs (10/18): LDL 82 Labs (6/19): LDL 82, HDL 34, K 4, creatinine 0.84  PMH: 1. Asthma: since his 25s.  2. Colon cancer: s/p sigmoid colectomy in 2015.  3. Hyperlipidemia 4. GERD 5. CAD: Anterior STEMI in 10/15, LHC showed total occlusion mid LAD and chronic total occlusion mid LCx with collaterals.  He had Xience DES to the LAD.   - Cardiolite (5/17) with fixed apical inferior defect, no ischemia.  Low risk study.  EF 58%.  6. Ischemic cardiomyopathy: Echo (10/15) with EF 45-50%, anterior/anteroseptal/apical severe hypokinesis.  Echo (1/16) with EF 55-60%.   SH: [employer], married Abdimalik Mayorquin), nonsmoker.   FH:  Father with CAD.   ROS: All systems reviewed and negative except as per HPI.   Current Outpatient Medications  Medication Sig Dispense Refill  . acetaminophen (TYLENOL) 500 MG tablet Take 500 mg by mouth as directed.     [name] Kitchen aspirin EC 81 MG tablet Take 1 tablet (81 mg total) by mouth daily. 90 tablet 3  . bisoprolol (ZEBETA) 5 MG tablet Take 1 tablet (5 mg total) by mouth daily. 90 tablet 3  . clopidogrel (PLAVIX) 75 MG tablet Take 1 tablet (75 mg total) by mouth daily. 90 tablet 3  . EPIPEN 2-PAK 0.3 MG/0.3ML SOAJ injection Inject 0.3 mg into the skin once.   0  . fluticasone (FLONASE) 50 MCG/ACT nasal spray Place 1 spray into both nostrils daily as needed for allergies.   0  . fluticasone (FLOVENT HFA) 110 MCG/ACT inhaler Inhale 1 puff into the lungs 2 (two) times daily. 1 Inhaler 12  . levalbuterol (XOPENEX HFA) 45 MCG/ACT inhaler Inhale 1-2 puffs into the lungs every 6 (six) hours as needed for wheezing. 1 Inhaler 12  . lisinopril (PRINIVIL,ZESTRIL) 5 MG tablet Take 0.5 tablets (2.5 mg total) by mouth daily. 45 tablet 3  . nitroGLYCERIN (NITROSTAT) 0.4 MG SL tablet Place 1 tablet (0.4 mg total) under the tongue every 5 (five) minutes x 3 doses as needed for chest  pain. 25 tablet 5  . pantoprazole (PROTONIX) 40 MG tablet Take 1 tablet (40 mg total) by mouth daily. 90 tablet 3  . rosuvastatin (CRESTOR) 40 MG tablet Take 1 tablet (40 mg total) by mouth daily. 90 tablet 3   No current facility-administered medications for this encounter.     BP 99/66   Pulse 77   Wt 72.7 kg (160 lb 3.2 oz)   BMI 22.99 kg/m  General: NAD Neck: No JVD, no thyromegaly or thyroid nodule.  Lungs: Clear to auscultation bilaterally with normal respiratory effort. CV: Nondisplaced PMI.  Heart regular S1/S2, no S3/S4, no murmur.  No peripheral edema.  No carotid bruit.  Normal pedal pulses.  Abdomen: Soft, nontender, no hepatosplenomegaly, no distention.  Skin: Intact without lesions or rashes.  Neurologic:  Alert and oriented x 3.  Psych: Normal affect. Extremities: No clubbing or cyanosis.  HEENT: Normal.   Assessment/Plan: 1. CAD: s/p anterior STEMI 10/15 with DES to mLAD.  Also had CTO mid LCx with collaterals that was not treated percutaneously.  Last Cardiolite in 5/17 was low risk with no ischemia. He has had worrisome symptoms recently, getting exhaustion + chest pressure when he walks up a flight of stairs or walks a block, this is new.  No chest pain at rest.  Thinks NTG helped the one time he took it.  - Symptoms are very worrisome for angina.  Given his past history, I think he merits coronary angiography. I would be concerned based on these symptoms even if he had a normal Cardiolite.  I will arrange for coronary angiography.  We discussed risks/benefits and he agrees to proceed.  I will get BMET and CBC. Cath scheduled for tomorrow.  - Continue ASA, Plavix, Crestor.  - Continue lisinopril 2.5 daily.  - Continue bisoprolol.   2. Ischemic cardiomyopathy: EF 45-50% on initial echo, improved to 55-60% in 1/16.  Not volume overloaded on exam. - Continue beta blocker and ACEI.  3. Hyperlipidemia: Continue Crestor, check lipids today since Crestor increased.  4. HTN: BP on the lower side today.  5. Asthma: He occasionally wheezes.  6. Snoring: Does not want sleep study.   Followup 2 wks after cath.   Loralie Champagne 05/18/2018

## 2018-05-18 NOTE — Patient Instructions (Signed)
Labs done today  Your physician has requested that you have a cardiac catheterization. Cardiac catheterization is used to diagnose and/or treat various heart conditions. Doctors may recommend this procedure for a number of different reasons. The most common reason is to evaluate chest pain. Chest pain can be a symptom of coronary artery disease (CAD), and cardiac catheterization can show whether plaque is narrowing or blocking your heart's arteries. This procedure is also used to evaluate the valves, as well as measure the blood flow and oxygen levels in different parts of your heart. For further information please visit [email or web address]. Please follow instruction sheet, as given.  Your physician recommends that you schedule a follow-up appointment in: 2 weeks   You are scheduled for a Cardiac Catheterization on Wednesday, October 30 with Dr. Loralie Champagne.  1. Please arrive at the [facility] (Main Entrance A) at [facility]: [address] [place], [place] [address] at 8:00 AM (This time is two hours before your procedure to ensure your preparation). Free valet parking service is available.   Special note: Every effort is made to have your procedure done on time. Please understand that emergencies sometimes delay scheduled procedures.  2. Diet: Do not eat solid foods after midnight.  The patient may have clear liquids until 5am upon the day of the procedure.  3. Labs: DONE TODAY  4. Medication instructions in preparation for your procedure:   Contrast Allergy: No  On the morning of your procedure, take your Aspirin and Plavix/Clopidogrel and any morning medicines NOT listed above.  You may use sips of water.  5. Plan for one night stay--bring personal belongings. 6. Bring a current list of your medications and current insurance cards. 7. You MUST have a responsible person to drive you home. 8. Someone MUST be with you the first 24 hours after you arrive home or your discharge  will be delayed. 9. Please wear clothes that are easy to get on and off and wear slip-on shoes.  Thank you for allowing [place] to care for you!   --  Invasive Cardiovascular services

## 2018-05-18 NOTE — H&P (View-Only) (Signed)
Patient ID: Jerry Montoya, male   DOB: 1955-10-07, 62 y.o.   MRN: [id number] PCP: Dr. Felipa Montoya Cardiology: Dr. Aundra Montoya  62 y.o. with history of asthma and colon cancer was admitted in 10/15 with anterior STEMI.  He had fresh total occlusion of the mid LAD and chronic total occlusion of the mid LCx with collaterals.  He had Xience DES to LAD.  Echo that admission showed EF 45-50%.  Repeat echo in 1/16 showed EF up to 55-60%.  In 5/17, he had a Cardiolite that was low risk with a fixed apical inferior defect and no ischemia.   Patient is worked in today for chest pain and dyspnea.  He had been doing very well until about 2 months ago.  At that time, he started to note more fatigue with his usual activities.  This has been gradually progressive.  At this point, he is very fatigued after climbing a flight of stairs, also chest chest pressure climbing a flight of stairs.  He gets exhaustion and chest pressure after walking about a month.  He says he does not "feel right."  He took an NTG once, thinks it helped when he took it.  His job has been stressful lately.  Weight is down 4 lbs.   ECG (personally reviewed): NSR, normal.   Labs (10/15): K 3.9, creatinine 0.81 => 1.0, LDL 129 Labs (10/16): K 3.9, creatinine 0.89, LDL 125, HDL 30 Labs (5/17): LDL 77, HDl 31, K 4.2, creatinine 0.9 Labs (10/18): LDL 82 Labs (6/19): LDL 82, HDL 34, K 4, creatinine 0.84  PMH: 1. Asthma: since his 11s.  2. Colon cancer: s/p sigmoid colectomy in 2015.  3. Hyperlipidemia 4. GERD 5. CAD: Anterior STEMI in 10/15, LHC showed total occlusion mid LAD and chronic total occlusion mid LCx with collaterals.  He had Xience DES to the LAD.   - Cardiolite (5/17) with fixed apical inferior defect, no ischemia.  Low risk study.  EF 58%.  6. Ischemic cardiomyopathy: Echo (10/15) with EF 45-50%, anterior/anteroseptal/apical severe hypokinesis.  Echo (1/16) with EF 55-60%.   SH: [employer], married Jerry Montoya), nonsmoker.   FH:  Father with CAD.   ROS: All systems reviewed and negative except as per HPI.   Current Outpatient Medications  Medication Sig Dispense Refill  . acetaminophen (TYLENOL) 500 MG tablet Take 500 mg by mouth as directed.     [name] Kitchen aspirin EC 81 MG tablet Take 1 tablet (81 mg total) by mouth daily. 90 tablet 3  . bisoprolol (ZEBETA) 5 MG tablet Take 1 tablet (5 mg total) by mouth daily. 90 tablet 3  . clopidogrel (PLAVIX) 75 MG tablet Take 1 tablet (75 mg total) by mouth daily. 90 tablet 3  . EPIPEN 2-PAK 0.3 MG/0.3ML SOAJ injection Inject 0.3 mg into the skin once.   0  . fluticasone (FLONASE) 50 MCG/ACT nasal spray Place 1 spray into both nostrils daily as needed for allergies.   0  . fluticasone (FLOVENT HFA) 110 MCG/ACT inhaler Inhale 1 puff into the lungs 2 (two) times daily. 1 Inhaler 12  . levalbuterol (XOPENEX HFA) 45 MCG/ACT inhaler Inhale 1-2 puffs into the lungs every 6 (six) hours as needed for wheezing. 1 Inhaler 12  . lisinopril (PRINIVIL,ZESTRIL) 5 MG tablet Take 0.5 tablets (2.5 mg total) by mouth daily. 45 tablet 3  . nitroGLYCERIN (NITROSTAT) 0.4 MG SL tablet Place 1 tablet (0.4 mg total) under the tongue every 5 (five) minutes x 3 doses as needed for chest  pain. 25 tablet 5  . pantoprazole (PROTONIX) 40 MG tablet Take 1 tablet (40 mg total) by mouth daily. 90 tablet 3  . rosuvastatin (CRESTOR) 40 MG tablet Take 1 tablet (40 mg total) by mouth daily. 90 tablet 3   No current facility-administered medications for this encounter.     BP 99/66   Pulse 77   Wt 72.7 kg (160 lb 3.2 oz)   BMI 22.99 kg/m  General: NAD Neck: No JVD, no thyromegaly or thyroid nodule.  Lungs: Clear to auscultation bilaterally with normal respiratory effort. CV: Nondisplaced PMI.  Heart regular S1/S2, no S3/S4, no murmur.  No peripheral edema.  No carotid bruit.  Normal pedal pulses.  Abdomen: Soft, nontender, no hepatosplenomegaly, no distention.  Skin: Intact without lesions or rashes.  Neurologic:  Alert and oriented x 3.  Psych: Normal affect. Extremities: No clubbing or cyanosis.  HEENT: Normal.   Assessment/Plan: 1. CAD: s/p anterior STEMI 10/15 with DES to mLAD.  Also had CTO mid LCx with collaterals that was not treated percutaneously.  Last Cardiolite in 5/17 was low risk with no ischemia. He has had worrisome symptoms recently, getting exhaustion + chest pressure when he walks up a flight of stairs or walks a block, this is new.  No chest pain at rest.  Thinks NTG helped the one time he took it.  - Symptoms are very worrisome for angina.  Given his past history, I think he merits coronary angiography. I would be concerned based on these symptoms even if he had a normal Cardiolite.  I will arrange for coronary angiography.  We discussed risks/benefits and he agrees to proceed.  I will get BMET and CBC. Cath scheduled for tomorrow.  - Continue ASA, Plavix, Crestor.  - Continue lisinopril 2.5 daily.  - Continue bisoprolol.   2. Ischemic cardiomyopathy: EF 45-50% on initial echo, improved to 55-60% in 1/16.  Not volume overloaded on exam. - Continue beta blocker and ACEI.  3. Hyperlipidemia: Continue Crestor, check lipids today since Crestor increased.  4. HTN: BP on the lower side today.  5. Asthma: He occasionally wheezes.  6. Snoring: Does not want sleep study.   Followup 2 wks after cath.   Jerry Montoya 05/18/2018

## 2018-05-19 ENCOUNTER — Telehealth (HOSPITAL_COMMUNITY): Payer: Self-pay

## 2018-05-19 ENCOUNTER — Encounter (HOSPITAL_COMMUNITY)
Admission: RE | Disposition: A | Discharge status: Home / Self Care | Payer: Self-pay | Source: Ambulatory Visit | Attending: Cardiology

## 2018-05-19 ENCOUNTER — Ambulatory Visit (HOSPITAL_COMMUNITY)
Admission: RE | Admit: 2018-05-19 | Discharge: 2018-05-19 | Disposition: A | Discharge status: Home / Self Care | Payer: 59 | Source: Ambulatory Visit | Attending: Cardiology | Admitting: Cardiology

## 2018-05-19 ENCOUNTER — Other Ambulatory Visit: Payer: Self-pay

## 2018-05-19 DIAGNOSIS — Z7902 Long term (current) use of antithrombotics/antiplatelets: Secondary | ICD-10-CM | POA: Insufficient documentation

## 2018-05-19 DIAGNOSIS — Z9049 Acquired absence of other specified parts of digestive tract: Secondary | ICD-10-CM | POA: Insufficient documentation

## 2018-05-19 DIAGNOSIS — R0683 Snoring: Secondary | ICD-10-CM | POA: Insufficient documentation

## 2018-05-19 DIAGNOSIS — I2582 Chronic total occlusion of coronary artery: Secondary | ICD-10-CM | POA: Insufficient documentation

## 2018-05-19 DIAGNOSIS — Z8249 Family history of ischemic heart disease and other diseases of the circulatory system: Secondary | ICD-10-CM | POA: Diagnosis not present

## 2018-05-19 DIAGNOSIS — Z7951 Long term (current) use of inhaled steroids: Secondary | ICD-10-CM | POA: Insufficient documentation

## 2018-05-19 DIAGNOSIS — I255 Ischemic cardiomyopathy: Secondary | ICD-10-CM | POA: Insufficient documentation

## 2018-05-19 DIAGNOSIS — Z85038 Personal history of other malignant neoplasm of large intestine: Secondary | ICD-10-CM | POA: Insufficient documentation

## 2018-05-19 DIAGNOSIS — I25119 Atherosclerotic heart disease of native coronary artery with unspecified angina pectoris: Secondary | ICD-10-CM | POA: Diagnosis not present

## 2018-05-19 DIAGNOSIS — R079 Chest pain, unspecified: Secondary | ICD-10-CM

## 2018-05-19 DIAGNOSIS — I252 Old myocardial infarction: Secondary | ICD-10-CM | POA: Diagnosis not present

## 2018-05-19 DIAGNOSIS — I10 Essential (primary) hypertension: Secondary | ICD-10-CM | POA: Insufficient documentation

## 2018-05-19 DIAGNOSIS — J45909 Unspecified asthma, uncomplicated: Secondary | ICD-10-CM | POA: Insufficient documentation

## 2018-05-19 DIAGNOSIS — E785 Hyperlipidemia, unspecified: Secondary | ICD-10-CM | POA: Insufficient documentation

## 2018-05-19 DIAGNOSIS — Z79899 Other long term (current) drug therapy: Secondary | ICD-10-CM | POA: Insufficient documentation

## 2018-05-19 DIAGNOSIS — K219 Gastro-esophageal reflux disease without esophagitis: Secondary | ICD-10-CM | POA: Diagnosis not present

## 2018-05-19 DIAGNOSIS — Z7982 Long term (current) use of aspirin: Secondary | ICD-10-CM | POA: Diagnosis not present

## 2018-05-19 DIAGNOSIS — Z955 Presence of coronary angioplasty implant and graft: Secondary | ICD-10-CM

## 2018-05-19 HISTORY — PX: INTRAVASCULAR PRESSURE WIRE/FFR STUDY: CATH118243

## 2018-05-19 HISTORY — PX: CORONARY STENT INTERVENTION: CATH118234

## 2018-05-19 HISTORY — PX: LEFT HEART CATH AND CORONARY ANGIOGRAPHY: CATH118249

## 2018-05-19 LAB — POCT ACTIVATED CLOTTING TIME
ACTIVATED CLOTTING TIME: 274 s
Activated Clotting Time: 230 seconds

## 2018-05-19 SURGERY — LEFT HEART CATH AND CORONARY ANGIOGRAPHY
Anesthesia: LOCAL

## 2018-05-19 MED ORDER — ONDANSETRON HCL 4 MG/2ML IJ SOLN: 4.0000 mg | Freq: Four times a day (QID) | INTRAMUSCULAR | Status: DC | PRN

## 2018-05-19 MED ORDER — HEPARIN (PORCINE) IN NACL 1000-0.9 UT/500ML-% IV SOLN
INTRAVENOUS | Status: AC
  Filled 2018-05-19: qty 1000

## 2018-05-19 MED ORDER — SODIUM CHLORIDE 0.9 % IV SOLN: 250.0000 mL | INTRAVENOUS | Status: DC | PRN

## 2018-05-19 MED ORDER — HEPARIN (PORCINE) IN NACL 1000-0.9 UT/500ML-% IV SOLN
INTRAVENOUS | Status: DC | PRN
  Administered 2018-05-19 (×2): 500 mL

## 2018-05-19 MED ORDER — SODIUM CHLORIDE 0.9 % WEIGHT BASED INFUSION: 1.0000 mL/kg/h | INTRAVENOUS | Status: DC

## 2018-05-19 MED ORDER — NITROGLYCERIN 1 MG/10 ML FOR IR/CATH LAB
INTRA_ARTERIAL | Status: DC | PRN
  Administered 2018-05-19 (×2): 100 ug via INTRACORONARY

## 2018-05-19 MED ORDER — SODIUM CHLORIDE 0.9% FLUSH: 3.0000 mL | INTRAVENOUS | Status: DC | PRN

## 2018-05-19 MED ORDER — SODIUM CHLORIDE 0.9% FLUSH: 3.0000 mL | Freq: Two times a day (BID) | INTRAVENOUS | Status: DC

## 2018-05-19 MED ORDER — NITROGLYCERIN 1 MG/10 ML FOR IR/CATH LAB
INTRA_ARTERIAL | Status: AC
  Filled 2018-05-19: qty 10

## 2018-05-19 MED ORDER — MIDAZOLAM HCL 2 MG/2ML IJ SOLN
INTRAMUSCULAR | Status: DC | PRN
  Administered 2018-05-19 (×2): 1 mg via INTRAVENOUS

## 2018-05-19 MED ORDER — MIDAZOLAM HCL 2 MG/2ML IJ SOLN
INTRAMUSCULAR | Status: AC
  Filled 2018-05-19: qty 2

## 2018-05-19 MED ORDER — LABETALOL HCL 5 MG/ML IV SOLN: 10.0000 mg | INTRAVENOUS | Status: DC | PRN

## 2018-05-19 MED ORDER — LIDOCAINE HCL (PF) 1 % IJ SOLN
INTRAMUSCULAR | Status: AC
  Filled 2018-05-19: qty 30

## 2018-05-19 MED ORDER — VERAPAMIL HCL 2.5 MG/ML IV SOLN
INTRAVENOUS | Status: AC
  Filled 2018-05-19: qty 2

## 2018-05-19 MED ORDER — HYDRALAZINE HCL 20 MG/ML IJ SOLN: 5.0000 mg | INTRAMUSCULAR | Status: DC | PRN

## 2018-05-19 MED ORDER — FENTANYL CITRATE (PF) 100 MCG/2ML IJ SOLN
INTRAMUSCULAR | Status: AC
  Filled 2018-05-19: qty 2

## 2018-05-19 MED ORDER — FENTANYL CITRATE (PF) 100 MCG/2ML IJ SOLN
INTRAMUSCULAR | Status: DC | PRN
  Administered 2018-05-19: 25 ug via INTRAVENOUS

## 2018-05-19 MED ORDER — ACETAMINOPHEN 325 MG PO TABS: 650.0000 mg | ORAL_TABLET | ORAL | Status: DC | PRN

## 2018-05-19 MED ORDER — ASPIRIN 81 MG PO CHEW: 81.0000 mg | CHEWABLE_TABLET | Freq: Once | ORAL | Status: DC

## 2018-05-19 MED ORDER — HEPARIN SODIUM (PORCINE) 1000 UNIT/ML IJ SOLN
INTRAMUSCULAR | Status: DC | PRN
  Administered 2018-05-19: 4000 [IU] via INTRAVENOUS
  Administered 2018-05-19: 3000 [IU] via INTRAVENOUS
  Administered 2018-05-19: 4000 [IU] via INTRAVENOUS

## 2018-05-19 MED ORDER — LIDOCAINE HCL (PF) 1 % IJ SOLN
INTRAMUSCULAR | Status: DC | PRN
  Administered 2018-05-19: 2 mL

## 2018-05-19 MED ORDER — VERAPAMIL HCL 2.5 MG/ML IV SOLN
INTRAVENOUS | Status: DC | PRN
  Administered 2018-05-19: 10 mL via INTRA_ARTERIAL

## 2018-05-19 MED ORDER — FENTANYL CITRATE (PF) 100 MCG/2ML IJ SOLN
INTRAMUSCULAR | Status: DC | PRN
  Administered 2018-05-19 (×2): 25 ug via INTRAVENOUS

## 2018-05-19 MED ORDER — EZETIMIBE 10 MG PO TABS: 10.0000 mg | ORAL_TABLET | Freq: Every day | ORAL | 3 refills | Status: DC

## 2018-05-19 MED ORDER — IOHEXOL 350 MG/ML SOLN
INTRAVENOUS | Status: DC | PRN
  Administered 2018-05-19: 40 mL via INTRACARDIAC
  Administered 2018-05-19: 80 mL via INTRACARDIAC

## 2018-05-19 MED ORDER — SODIUM CHLORIDE 0.9 % IV SOLN
INTRAVENOUS | Status: AC | PRN
  Administered 2018-05-19: 250 mL via INTRAVENOUS

## 2018-05-19 MED ORDER — MIDAZOLAM HCL 2 MG/2ML IJ SOLN
INTRAMUSCULAR | Status: DC | PRN
  Administered 2018-05-19: 1 mg via INTRAVENOUS

## 2018-05-19 MED ORDER — SODIUM CHLORIDE 0.9 % IV SOLN: INTRAVENOUS | Status: DC

## 2018-05-19 SURGICAL SUPPLY — 20 items
BALLN SAPPHIRE 2.0X12 (BALLOONS) ×2
BALLN ~~LOC~~ EMERGE MR 2.75X6 (BALLOONS) ×2
BALLOON SAPPHIRE 2.0X12 (BALLOONS) IMPLANT
BALLOON ~~LOC~~ EMERGE MR 2.75X6 (BALLOONS) IMPLANT
CATH 5FR JL3.5 JR4 ANG PIG MP (CATHETERS) ×1 IMPLANT
CATH LAUNCHER 6FR EBU3.5 (CATHETERS) ×1 IMPLANT
DEVICE RAD COMP TR BAND LRG (VASCULAR PRODUCTS) ×1 IMPLANT
GLIDESHEATH SLEND SS 6F .021 (SHEATH) ×1 IMPLANT
GUIDEWIRE INQWIRE 1.5J.035X260 (WIRE) IMPLANT
GUIDEWIRE PRESSURE COMET II (WIRE) ×1 IMPLANT
INQWIRE 1.5J .035X260CM (WIRE) ×2
KIT ENCORE 26 ADVANTAGE (KITS) ×1 IMPLANT
KIT HEART LEFT (KITS) ×2 IMPLANT
KIT HEMO VALVE WATCHDOG (MISCELLANEOUS) ×1 IMPLANT
PACK CARDIAC CATHETERIZATION (CUSTOM PROCEDURE TRAY) ×2 IMPLANT
SHEATH PROBE COVER 6X72 (BAG) ×1 IMPLANT
STENT RESOLUTE ONYX 2.5X12 (Permanent Stent) ×1 IMPLANT
SYR MEDRAD MARK V 150ML (SYRINGE) ×2 IMPLANT
TRANSDUCER W/STOPCOCK (MISCELLANEOUS) ×2 IMPLANT
TUBING CIL FLEX 10 FLL-RA (TUBING) ×2 IMPLANT

## 2018-05-19 NOTE — Progress Notes (Signed)
Pt is already on Plavix and will not need to see pharmacy. (Spoke with Mendel Ryder)

## 2018-05-19 NOTE — Interval H&P Note (Signed)
History and Physical Interval Note:  05/19/2018 9:24 AM  Jerry Montoya  has presented today for surgery, with the diagnosis of cp  The various methods of treatment have been discussed with the patient and family. After consideration of risks, benefits and other options for treatment, the patient has consented to  Procedure(s): LEFT HEART CATH AND CORONARY ANGIOGRAPHY (N/A) as a surgical intervention .  The patient's history has been reviewed, patient examined, no change in status, stable for surgery.  I have reviewed the patient's chart and labs.  Questions were answered to the patient's satisfaction.     Hiilani Jetter [employer]

## 2018-05-19 NOTE — Discharge Instructions (Signed)

## 2018-05-19 NOTE — Telephone Encounter (Signed)
Pt called with results LDL remains above goal < 70 and had stent placed.  Needs to start Zetia 10 mg daily with lipids in 2 months. Lab work scheduled

## 2018-05-19 NOTE — Progress Notes (Signed)
Report received states that EKG was done post cath and the Dr is aware.

## 2018-05-19 NOTE — Progress Notes (Signed)
Ed completed with pt. Good reception. Understands importance of Plavix/ASA. Will refer to [place]. [place], ACSM 3:04 PM 05/19/2018

## 2018-05-19 NOTE — Progress Notes (Signed)
Pt moving right arm and hand about, even after instructed not to move. Pt began to bleed pressure applied and Dr Burt Knack informed

## 2018-05-19 NOTE — Discharge Summary (Signed)
Discharge Summary/SAME DAY PCI    Patient ID: Jerry Montoya,  MRN: [id number], DOB/AGE: 1956/06/17 62 y.o.  Admit date: 05/19/2018 Discharge date: 05/19/2018  Primary Care Provider: Lajean Manes Primary Cardiologist: Dr. Aundra Dubin   Discharge Diagnoses    Active Problems:   Coronary artery disease involving native coronary artery with angina pectoris [facility])   Allergies Allergies  Allergen Reactions  . Albuterol Other (See Comments)    Caused heart attack    Diagnostic Studies/Procedures    Cath: 05/19/18  1. Chronic total occlusion of the proximal LCx with good collaterals.  This is unchanged from the past.  2. 60% proximal LAD stenosis followed by moderate D1 with 90% proximal stenosis.  Patent prox-mid LAD stent.  3. Preserved EF on LV-gram.   I suspect that the diagonal is the source of Jerry Montoya's angina.  Will plan PCI to D1, discussed with Dr. Burt Knack.  Will plan DFR of the proximal LAD and treat if hemodynamically significant.   Cath: 05/19/18   Ost 1st Diag to 1st Diag lesion is 90% stenosed.  A drug-eluting stent was successfully placed using a STENT RESOLUTE ONYX 2.5X12.  Post intervention, there is a 0% residual stenosis.   Successful PCI of the first diagonal branch of the LAD, reducing stenosis from 90% down to 0% with TIMI-3 flow both pre-and post.  Recommend uninterrupted dual antiplatelet therapy with Aspirin 81mg  daily and Clopidogrel 75mg  daily for a minimum of 6 months (stable ischemic heart disease - Class I recommendation). _____________   History of Present Illness     62 y.o. with history of asthma and colon cancer was admitted in 10/15 with anterior STEMI.  He had fresh total occlusion of the mid LAD and chronic total occlusion of the mid LCx with collaterals.  He had Xience DES to LAD.  Echo that admission showed EF 45-50%.  Repeat echo in 1/16 showed EF up to 55-60%.  In 5/17, he had a Cardiolite that was low risk with a fixed apical  inferior defect and no ischemia.   Patient was worked in on 10/29 for chest pain and dyspnea.  He had been doing very well until about 2 months prior.  At that time, he started to note more fatigue with his usual activities.  This had been gradually progressive.  At this office visit, he reported being very fatigued after climbing a flight of stairs, also chest chest pressure climbing a flight of stairs. He would be easily exhausted and have chest pressure after walking for about a month.  He said he just did not "feel right."  He took an NTG once, thought it helped when he took it.  His job had been stressful lately.  Weight is down 4 lbs. Given his symptoms he was set up for outpatient cardiac cath.   Hospital Course     Underwent cath noted above with Dr. Aundra Dubin with known CTO of the Lcx, unchanged. New lesion in the pLAD of 69% with moderate D1 lesion of 90%. Dr. Burt Knack called to further assess, and DFR noted the lesion to be hemodynamically significant. Underwent successful PCI/DES x1. Plan for DAPT with ASA/plavix for at least 6 months. He was seen by cardiac rehab while in short stay and provided education. Instructions/precautions regarding site given prior to discharge.    Jerry Montoya was seen by Dr. Burt Knack and determined stable for discharge home. Follow up in the office has been arranged. Medications are listed below.  _____________  Discharge Vitals Blood pressure 112/62, pulse 66, temperature 97.8 F (36.6 C), temperature source Oral, resp. rate 16, height 5\' 10"  (1.778 m), weight 72.6 kg, SpO2 98 %.  Filed Weights   05/19/18 0824  Weight: 72.6 kg    Labs & Radiologic Studies    CBC Recent Labs    05/18/18 1621  WBC 6.8  HGB 14.2  HCT 45.6  MCV 93.3  PLT 737   Basic Metabolic Panel Recent Labs    05/18/18 1621  NA 139  K 4.2  CL 109  CO2 24  GLUCOSE 107*  BUN 19  CREATININE 0.94  CALCIUM 9.3   Liver Function Tests No results for input(s): AST, ALT,  ALKPHOS, BILITOT, PROT, ALBUMIN in the last 72 hours. No results for input(s): LIPASE, AMYLASE in the last 72 hours. Cardiac Enzymes No results for input(s): CKTOTAL, CKMB, CKMBINDEX, TROPONINI in the last 72 hours. BNP Invalid input(s): POCBNP D-Dimer No results for input(s): DDIMER in the last 72 hours. Hemoglobin A1C No results for input(s): HGBA1C in the last 72 hours. Fasting Lipid Panel Recent Labs    05/18/18 1621  CHOL 139  HDL 35*  LDLCALC 80  TRIG 119  CHOLHDL 4.0   Thyroid Function Tests No results for input(s): TSH, T4TOTAL, T3FREE, THYROIDAB in the last 72 hours.  Invalid input(s): FREET3 _____________  No results found. Disposition   Pt is being discharged home today in good condition.  Follow-up Plans & Appointments    Follow-up Information    Larey Dresser, MD Follow up on 06/02/2018.   Specialty:  Cardiology Why:  at 9:30am for your follow up appt.  Contact information: [place] [address] [phone]          Discharge Instructions    Amb Referral to Cardiac Rehabilitation   Complete by:  As directed    Diagnosis:   Coronary Stents PTCA       Discharge Medications     Medication List    TAKE these medications   acetaminophen 500 MG tablet Commonly known as:  TYLENOL Take 500 mg by mouth every 8 (eight) hours as needed for mild pain.   albuterol 108 (90 Base) MCG/ACT inhaler Commonly known as:  PROVENTIL HFA;VENTOLIN HFA Inhale 1-2 puffs into the lungs every 6 (six) hours as needed for wheezing or shortness of breath.   aspirin EC 81 MG tablet Take 1 tablet (81 mg total) by mouth daily.   bisoprolol 5 MG tablet Commonly known as:  ZEBETA Take 1 tablet (5 mg total) by mouth daily.   clopidogrel 75 MG tablet Commonly known as:  PLAVIX Take 1 tablet (75 mg total) by mouth daily.   EPIPEN 2-PAK 0.3 mg/0.3 mL Soaj injection Generic drug:  EPINEPHrine Inject 0.3 mg into the skin once.   ezetimibe 10  MG tablet Commonly known as:  ZETIA Take 1 tablet (10 mg total) by mouth daily.   fluticasone 110 MCG/ACT inhaler Commonly known as:  FLOVENT HFA Inhale 1 puff into the lungs 2 (two) times daily. What changed:    when to take this  reasons to take this   fluticasone 50 MCG/ACT nasal spray Commonly known as:  FLONASE Place 1 spray into both nostrils daily as needed for allergies.   levalbuterol 45 MCG/ACT inhaler Commonly known as:  XOPENEX HFA Inhale 1-2 puffs into the lungs every 6 (six) hours as needed for wheezing.   lisinopril 5 MG tablet Commonly known as:  PRINIVIL,ZESTRIL Take 0.5 tablets (2.5 mg total) by mouth daily.   nitroGLYCERIN 0.4 MG SL tablet Commonly known as:  NITROSTAT Place 1 tablet (0.4 mg total) under the tongue every 5 (five) minutes x 3 doses as needed for chest pain.   pantoprazole 40 MG tablet Commonly known as:  PROTONIX Take 1 tablet (40 mg total) by mouth daily.   rosuvastatin 40 MG tablet Commonly known as:  CRESTOR Take 1 tablet (40 mg total) by mouth daily.       Acute coronary syndrome (MI, NSTEMI, STEMI, etc) this admission?: No.     Outstanding Labs/Studies   N/a   Duration of Discharge Encounter   Greater than 30 minutes including physician time.  Signed, Reino Bellis NP-C 05/19/2018, 3:12 PM

## 2018-05-20 ENCOUNTER — Encounter (HOSPITAL_COMMUNITY): Payer: Self-pay | Admitting: Cardiology

## 2018-05-24 ENCOUNTER — Telehealth (HOSPITAL_COMMUNITY): Payer: Self-pay

## 2018-05-24 NOTE — Telephone Encounter (Signed)
Called patient to see if he was interested in participating in the Cardiac Rehab Program. Patient stated yes. Patient will come in for orientation on 07/20/18 @ 7:30AM and will attend the 6:45AM exercise class.  Mailed homework package.    Went over insurance, patient verbalized understanding.

## 2018-05-24 NOTE — Telephone Encounter (Signed)
Pt insurance is active and benefits verified through [facility]. Co-pay $0.00, DED $3,000.00/$3,000.00 met, out of pocket $6,600.00/$3,692.12 met, co-insurance 20%. No pre-authorization. Passport, 05/24/18 @ 9:41AM, REF# [id number]

## 2018-05-26 ENCOUNTER — Telehealth (HOSPITAL_COMMUNITY): Payer: Self-pay

## 2018-06-02 ENCOUNTER — Ambulatory Visit (HOSPITAL_COMMUNITY)
Admission: RE | Admit: 2018-06-02 | Discharge: 2018-06-02 | Disposition: A | Discharge status: Home / Self Care | Payer: 59 | Source: Ambulatory Visit | Attending: Internal Medicine | Admitting: Internal Medicine

## 2018-06-02 ENCOUNTER — Encounter (HOSPITAL_COMMUNITY): Payer: Self-pay

## 2018-06-02 VITALS — BP 136/82 | HR 81 | Wt 154.0 lb

## 2018-06-02 DIAGNOSIS — I255 Ischemic cardiomyopathy: Secondary | ICD-10-CM | POA: Diagnosis not present

## 2018-06-02 DIAGNOSIS — E785 Hyperlipidemia, unspecified: Secondary | ICD-10-CM | POA: Diagnosis not present

## 2018-06-02 DIAGNOSIS — Z01812 Encounter for preprocedural laboratory examination: Secondary | ICD-10-CM | POA: Insufficient documentation

## 2018-06-02 DIAGNOSIS — I25119 Atherosclerotic heart disease of native coronary artery with unspecified angina pectoris: Secondary | ICD-10-CM | POA: Diagnosis not present

## 2018-06-02 LAB — BASIC METABOLIC PANEL
ANION GAP: 6 (ref 5–15)
BUN: 16 mg/dL (ref 8–23)
CALCIUM: 9.2 mg/dL (ref 8.9–10.3)
CO2: 25 mmol/L (ref 22–32)
Chloride: 107 mmol/L (ref 98–111)
Creatinine, Ser: 0.77 mg/dL (ref 0.61–1.24)
GFR calc Af Amer: >60 mL/min (ref >60)
GLUCOSE: 108 mg/dL — AB (ref 70–99)
Potassium: 3.9 mmol/L (ref 3.5–5.1)
Sodium: 138 mmol/L (ref 135–145)

## 2018-06-02 LAB — CBC
HCT: 45.6 % (ref 39.0–52.0)
Hemoglobin: 15.1 g/dL (ref 13.0–17.0)
MCH: 30.5 pg (ref 26.0–34.0)
MCHC: 33.1 g/dL (ref 30.0–36.0)
MCV: 92.1 fL (ref 80.0–100.0)
PLATELETS: 163 10*3/uL (ref 150–400)
RBC: 4.95 MIL/uL (ref 4.22–5.81)
RDW: 12.2 % (ref 11.5–15.5)
WBC: 4.7 10*3/uL (ref 4.0–10.5)
nRBC: 0 % (ref 0.0–0.2)

## 2018-06-02 NOTE — Patient Instructions (Signed)
It was great to see you today! No medication changes are needed at this time.   Labs today We will only contact you if something comes back abnormal or we need to make some changes. Otherwise no news is good news!   Your physician has requested that you have an echocardiogram. Echocardiography is a painless test that uses sound waves to create images of your heart. It provides your doctor with information about the size and shape of your heart and how well your heart's chambers and valves are working. This procedure takes approximately one hour. There are no restrictions for this procedure.  Your physician recommends that you schedule a follow-up appointment in: 3 months with Dr Aundra Dubin   Do the following things EVERYDAY: 1) Weigh yourself in the morning before breakfast. Write it down and keep it in a log. 2) Take your medicines as prescribed 3) Eat low salt foods-Limit salt (sodium) to 2000 mg per day.  4) Stay as active as you can everyday 5) Limit all fluids for the day to less than 2 liters

## 2018-06-02 NOTE — Progress Notes (Signed)
Patient ID: MD SMOLA, male   DOB: 1956-03-02, 62 y.o.   MRN: [id number] PCP: Dr. Felipa Eth Cardiology: Dr. Aundra Dubin  62 y.o. with history of asthma and colon cancer was admitted in 10/15 with anterior STEMI.  He had fresh total occlusion of the mid LAD and chronic total occlusion of the mid LCx with collaterals.  He had Xience DES to LAD.  Echo that admission showed EF 45-50%.  Repeat echo in 1/16 showed EF up to 55-60%.  In 5/17, he had a Cardiolite that was low risk with a fixed apical inferior defect and no ischemia.   Admitted for Arab on 10/29 and underwent DES to LAD on 05/19/18.   Today he returns for follow up post heart cath. Overall feeling ok. He has had some RUE soreness but says its improving.  Still having some fatigue. He has not needed nitroglycerin. Denies SOB/PND/Orthopnea. No bleeding Appetite ok. No fever or chills. Weight at home has been stable.  Wants to start cardiac rehab. Taking all medications.   Labs (10/15): K 3.9, creatinine 0.81 => 1.0, LDL 129 Labs (10/16): K 3.9, creatinine 0.89, LDL 125, HDL 30 Labs (5/17): LDL 77, HDl 31, K 4.2, creatinine 0.9 Labs (10/18): LDL 82 Labs (6/19): LDL 82, HDL 34, K 4, creatinine 0.84 Labs (05/18/18): K 4.2 Creatinine 0.94   PMH: 1. Asthma: since his 48s.  2. Colon cancer: s/p sigmoid colectomy in 2015.  3. Hyperlipidemia 4. GERD 5. CAD: Anterior STEMI in 10/15, LHC showed total occlusion mid LAD and chronic total occlusion mid LCx with collaterals.  He had Xience DES to the LAD.   - Cardiolite (5/17) with fixed apical inferior defect, no ischemia.  Low risk study.  EF 58%.  6. Ischemic cardiomyopathy: Echo (10/15) with EF 45-50%, anterior/anteroseptal/apical severe hypokinesis.  Echo (1/16) with EF 55-60%.   7. LHC 05/19/2018 -Ost 1st Diag to 1st Diag lesion is 90% stenosed.  A drug-eluting stent was successfully placed using a STENT RESOLUTE ONYX 2.5X12. Post intervention, there is a 0% residual stenosis.  SH: [employer], married Foye Damron), nonsmoker.   FH: Father with CAD.   ROS: All systems reviewed and negative except as per HPI.   Current Outpatient Medications  Medication Sig Dispense Refill  . acetaminophen (TYLENOL) 500 MG tablet Take 500 mg by mouth every 8 (eight) hours as needed for mild pain.     [name] Kitchen aspirin EC 81 MG tablet Take 1 tablet (81 mg total) by mouth daily. 90 tablet 3  . bisoprolol (ZEBETA) 5 MG tablet Take 1 tablet (5 mg total) by mouth daily. 90 tablet 3  . clopidogrel (PLAVIX) 75 MG tablet Take 1 tablet (75 mg total) by mouth daily. 90 tablet 3  . EPIPEN 2-PAK 0.3 MG/0.3ML SOAJ injection Inject 0.3 mg into the skin once.   0  . ezetimibe (ZETIA) 10 MG tablet Take 1 tablet (10 mg total) by mouth daily. 90 tablet 3  . fluticasone (FLOVENT HFA) 110 MCG/ACT inhaler Inhale 1 puff into the lungs 2 (two) times daily. (Patient taking differently: Inhale 1 puff into the lungs 2 (two) times daily as needed (shortness of breath). ) 1 Inhaler 12  . levalbuterol (XOPENEX HFA) 45 MCG/ACT inhaler Inhale 1-2 puffs into the lungs every 6 (six) hours as needed for wheezing. 1 Inhaler 12  . lisinopril (PRINIVIL,ZESTRIL) 5 MG tablet Take 0.5 tablets (2.5 mg total) by mouth daily. 45 tablet 3  . nitroGLYCERIN (NITROSTAT) 0.4 MG SL tablet Place 1  tablet (0.4 mg total) under the tongue every 5 (five) minutes x 3 doses as needed for chest pain. 25 tablet 5  . pantoprazole (PROTONIX) 40 MG tablet Take 1 tablet (40 mg total) by mouth daily. 90 tablet 3  . rosuvastatin (CRESTOR) 40 MG tablet Take 1 tablet (40 mg total) by mouth daily. 90 tablet 3   No current facility-administered medications for this encounter.     BP 136/82   Pulse 81   Wt 69.9 kg (154 lb)   SpO2 96%   BMI 22.10 kg/m   Wt Readings from Last 3 Encounters:  06/02/18 69.9 kg (154 lb)  05/19/18 72.6 kg (160 lb)  05/18/18 72.7 kg (160 lb 3.2 oz)    General:  Well appearing. No resp difficulty HEENT: normal Neck: supple. no JVD.  Carotids 2+ bilat; no bruits. No lymphadenopathy or thryomegaly appreciated. Cor: PMI nondisplaced. Regular rate & rhythm. No rubs, gallops or murmurs. Lungs: clear Abdomen: soft, nontender, nondistended. No hepatosplenomegaly. No bruits or masses. Good bowel sounds. Extremities: no cyanosis, clubbing, rash, edema. RUE mild ecchymosis.  Neuro: alert & orientedx3, cranial nerves grossly intact. moves all 4 extremities w/o difficulty. Affect pleasant  Assessment/Plan: 1. CAD: s/p anterior STEMI 10/15 with DES to mLAD.  Also had CTO mid LCx with collaterals that was not treated percutaneously.  Last Cardiolite in 5/17 was low risk with no ischemia.  S/P 05/19/2018 LHC wit DES to D1.  - No s/s ischemia.  - Continue ASA, Plavix, Crestor.  - Continue lisinopril 2.5 daily.  - Continue bisoprolol.   - Needs to start cardiac rehab.  2. Ischemic cardiomyopathy: EF 45-50% on initial echo, improved to 55-60% in 1/16.  - Repeat ECHO. Volume status stable.  Not volume overloaded on exam. - Continue beta blocker and ACEI.  3. Hyperlipidemia: Continue Crestor and zetia.   4. HTN: Stable. Continue current regimen.   5. Asthma: Has inhalers He occasionally wheezes.  6. Snoring: Does not want sleep study.   Check BMET, CBC now.  Follow up  3 months with Dr Aundra Dubin.     NP-C  06/02/2018

## 2018-06-03 ENCOUNTER — Telehealth (HOSPITAL_COMMUNITY): Payer: Self-pay | Admitting: *Deleted

## 2018-06-03 NOTE — Telephone Encounter (Signed)
FMLA paper work faxed to [employer].

## 2018-06-09 DIAGNOSIS — I2510 Atherosclerotic heart disease of native coronary artery without angina pectoris: Secondary | ICD-10-CM | POA: Diagnosis not present

## 2018-06-23 ENCOUNTER — Telehealth (HOSPITAL_COMMUNITY): Payer: Self-pay

## 2018-06-23 NOTE — Telephone Encounter (Signed)
Called and spoke to pt in regards to CR, patient will come in for orientation on 06/24/18 @ 815AM and will attend the 645AM exercise class.

## 2018-06-24 ENCOUNTER — Telehealth (HOSPITAL_COMMUNITY): Payer: Self-pay | Admitting: *Deleted

## 2018-06-24 ENCOUNTER — Encounter (HOSPITAL_COMMUNITY)
Admission: RE | Admit: 2018-06-24 | Discharge: 2018-06-24 | Disposition: A | Discharge status: Home / Self Care | Payer: 59 | Source: Ambulatory Visit | Attending: Cardiology | Admitting: Cardiology

## 2018-06-24 VITALS — Ht 70.0 in | Wt 161.4 lb

## 2018-06-24 DIAGNOSIS — I252 Old myocardial infarction: Secondary | ICD-10-CM | POA: Diagnosis not present

## 2018-06-24 DIAGNOSIS — I10 Essential (primary) hypertension: Secondary | ICD-10-CM | POA: Insufficient documentation

## 2018-06-24 DIAGNOSIS — Z7982 Long term (current) use of aspirin: Secondary | ICD-10-CM | POA: Insufficient documentation

## 2018-06-24 DIAGNOSIS — Z955 Presence of coronary angioplasty implant and graft: Secondary | ICD-10-CM | POA: Insufficient documentation

## 2018-06-24 DIAGNOSIS — Z7902 Long term (current) use of antithrombotics/antiplatelets: Secondary | ICD-10-CM | POA: Diagnosis not present

## 2018-06-24 DIAGNOSIS — Z79899 Other long term (current) drug therapy: Secondary | ICD-10-CM | POA: Diagnosis not present

## 2018-06-24 DIAGNOSIS — E7800 Pure hypercholesterolemia, unspecified: Secondary | ICD-10-CM | POA: Diagnosis not present

## 2018-06-24 DIAGNOSIS — J45909 Unspecified asthma, uncomplicated: Secondary | ICD-10-CM | POA: Diagnosis not present

## 2018-06-24 NOTE — Telephone Encounter (Signed)
Med rec faxed per patient signed med rec release form

## 2018-06-24 NOTE — Progress Notes (Signed)
Cardiac Rehab Medication Review by a RN  Does the patient feel that his/her medications are working for him/her?  yes  Has the patient been experiencing any side effects to the medications prescribed?  no  Does the patient measure his/her own blood pressure or blood glucose at home?  yes   Does the patient have any problems obtaining medications due to transportation or finances?   no  Understanding of regimen: good Understanding of indications: good Potential of compliance: good    RN comments: N/A    Jerry Montoya 06/24/2018 10:11 AM

## 2018-06-24 NOTE — Progress Notes (Signed)
Cardiac Individual Treatment Plan  Patient Details  Name: Jerry Montoya MRN: [id number] Date of Birth: 1956/04/17 Referring Provider:     [facility] from 06/24/2018 in [facility]  Referring Provider  Dr. Aundra Dubin      Initial Encounter Date:    CARDIAC REHAB PHASE II ORIENTATION from 06/24/2018 in [facility]  Date  06/24/18      Visit Diagnosis: Status post coronary artery stent placement  Patient's Home Medications on Admission:  Current Outpatient Medications:  .  acetaminophen (TYLENOL) 500 MG tablet, Take 500 mg by mouth every 8 (eight) hours as needed for mild pain. , Disp: , Rfl:  .  aspirin EC 81 MG tablet, Take 1 tablet (81 mg total) by mouth daily., Disp: 90 tablet, Rfl: 3 .  bisoprolol (ZEBETA) 5 MG tablet, Take 1 tablet (5 mg total) by mouth daily., Disp: 90 tablet, Rfl: 3 .  clopidogrel (PLAVIX) 75 MG tablet, Take 1 tablet (75 mg total) by mouth daily., Disp: 90 tablet, Rfl: 3 .  EPIPEN 2-PAK 0.3 MG/0.3ML SOAJ injection, Inject 0.3 mg into the skin once. , Disp: , Rfl: 0 .  ezetimibe (ZETIA) 10 MG tablet, Take 1 tablet (10 mg total) by mouth daily., Disp: 90 tablet, Rfl: 3 .  fluticasone (FLOVENT HFA) 110 MCG/ACT inhaler, Inhale 1 puff into the lungs 2 (two) times daily. (Patient taking differently: Inhale 1 puff into the lungs 2 (two) times daily as needed (shortness of breath). ), Disp: 1 Inhaler, Rfl: 12 .  levalbuterol (XOPENEX HFA) 45 MCG/ACT inhaler, Inhale 1-2 puffs into the lungs every 6 (six) hours as needed for wheezing., Disp: 1 Inhaler, Rfl: 12 .  lisinopril (PRINIVIL,ZESTRIL) 5 MG tablet, Take 0.5 tablets (2.5 mg total) by mouth daily., Disp: 45 tablet, Rfl: 3 .  nitroGLYCERIN (NITROSTAT) 0.4 MG SL tablet, Place 1 tablet (0.4 mg total) under the tongue every 5 (five) minutes x 3 doses as needed for chest pain., Disp: 25 tablet, Rfl: 5 .  pantoprazole (PROTONIX) 40 MG tablet,  Take 1 tablet (40 mg total) by mouth daily., Disp: 90 tablet, Rfl: 3 .  rosuvastatin (CRESTOR) 40 MG tablet, Take 1 tablet (40 mg total) by mouth daily., Disp: 90 tablet, Rfl: 3  Past Medical History: Past Medical History:  Diagnosis Date  . Asthma   . Cancer ([place]) 2015   colon cancer  . Esophageal reflux   . Hypercholesterolemia   . Hypertension   . Myocardial infarction ([place]) 2015    Tobacco Use: Social History   Tobacco Use  Smoking Status Never Smoker  Smokeless Tobacco Never Used    Labs: Recent Review Flowsheet Data    Labs for ITP Cardiac and Pulmonary Rehab Latest Ref Rng & Units 05/02/2015 11/26/2015 02/12/2017 01/01/2018 05/18/2018   Cholestrol 0 - 200 mg/dL 182 123(L) 139 128 139   LDLCALC 0 - 99 mg/dL 125 77 86 82 80   HDL >40 mg/dL 30(L) 31(L) 36(L) 34(L) 35(L)   Trlycerides <150 mg/dL 137 75 83 62 119   Hemoglobin A1c 4.8 - 5.6 % - - - 5.4 -      Capillary Blood Glucose: Lab Results  Component Value Date   GLUCAP 104 (H) 03/24/2014     Exercise Target Goals: Exercise Program Goal: Individual exercise prescription set using results from initial 6 min walk test and THRR while considering  patient's activity barriers and safety.   Exercise Prescription Goal: Initial  exercise prescription builds to 30-45 minutes a day of aerobic activity, 2-3 days per week.  Home exercise guidelines will be given to patient during program as part of exercise prescription that the participant will acknowledge.  Activity Barriers & Risk Stratification: Activity Barriers & Cardiac Risk Stratification - 06/24/18 1020      Activity Barriers & Cardiac Risk Stratification   Activity Barriers  None    Cardiac Risk Stratification  Moderate       6 Minute Walk: 6 Minute Walk    Row Name 06/24/18 1019         6 Minute Walk   Phase  Initial     Distance  2032 feet     Walk Time  6 minutes     # of Rest Breaks  0     MPH  3.85     METS  4.69     RPE  12     VO2 Peak   16.41     Symptoms  No     Resting HR  68 bpm     Resting BP  108/58     Resting Oxygen Saturation   96 %     Exercise Oxygen Saturation  during 6 min walk  96 %     Max Ex. HR  92 bpm     Max Ex. BP  122/70     2 Minute Post BP  118/62        Oxygen Initial Assessment:   Oxygen Re-Evaluation:   Oxygen Discharge (Final Oxygen Re-Evaluation):   Initial Exercise Prescription: Initial Exercise Prescription - 06/24/18 1000      Date of Initial Exercise RX and Referring Provider   Date  06/24/18    Referring Provider  Dr. Aundra Dubin    Expected Discharge Date  09/29/18      Treadmill   MPH  3.4    Grade  0    Minutes  10      Recumbant Bike   Level  2    Watts  60    Minutes  10    METs  4.52      NuStep   Level  3    SPM  85    Minutes  10    METs  4.4      Prescription Details   Frequency (times per week)  3    Duration  Progress to 30 minutes of continuous aerobic without signs/symptoms of physical distress      Intensity   THRR 40-80% of Max Heartrate  62-125    Ratings of Perceived Exertion  11-13      Progression   Progression  Continue to progress workloads to maintain intensity without signs/symptoms of physical distress.      Resistance Training   Training Prescription  Yes    Weight  3 lbs.     Reps  10-15       Perform Capillary Blood Glucose checks as needed.  Exercise Prescription Changes:   Exercise Comments:   Exercise Goals and Review: Exercise Goals    Row Name 06/24/18 1022             Exercise Goals   Increase Physical Activity  Yes       Intervention  Provide advice, education, support and counseling about physical activity/exercise needs.;Develop an individualized exercise prescription for aerobic and resistive training based on initial evaluation findings, risk stratification, comorbidities and participant's personal goals.  Expected Outcomes  Short Term: Attend rehab on a regular basis to increase amount of physical  activity.       Increase Strength and Stamina  Yes       Intervention  Provide advice, education, support and counseling about physical activity/exercise needs.;Develop an individualized exercise prescription for aerobic and resistive training based on initial evaluation findings, risk stratification, comorbidities and participant's personal goals.       Expected Outcomes  Short Term: Increase workloads from initial exercise prescription for resistance, speed, and METs.       Able to understand and use rate of perceived exertion (RPE) scale  Yes       Intervention  Provide education and explanation on how to use RPE scale       Expected Outcomes  Short Term: Able to use RPE daily in rehab to express subjective intensity level;Long Term:  Able to use RPE to guide intensity level when exercising independently       Knowledge and understanding of Target Heart Rate Range (THRR)  Yes       Intervention  Provide education and explanation of THRR including how the numbers were predicted and where they are located for reference       Expected Outcomes  Short Term: Able to state/look up THRR;Long Term: Able to use THRR to govern intensity when exercising independently;Short Term: Able to use daily as guideline for intensity in rehab       Able to check pulse independently  Yes       Intervention  Provide education and demonstration on how to check pulse in carotid and radial arteries.;Review the importance of being able to check your own pulse for safety during independent exercise       Expected Outcomes  Short Term: Able to explain why pulse checking is important during independent exercise;Long Term: Able to check pulse independently and accurately       Understanding of Exercise Prescription  Yes       Intervention  Provide education, explanation, and written materials on patient's individual exercise prescription       Expected Outcomes  Short Term: Able to explain program exercise prescription;Long Term:  Able to explain home exercise prescription to exercise independently          Exercise Goals Re-Evaluation :   Discharge Exercise Prescription (Final Exercise Prescription Changes):   Nutrition:  Target Goals: Understanding of nutrition guidelines, daily intake of sodium 1500mg , cholesterol 200mg , calories 30% from fat and 7% or less from saturated fats, daily to have 5 or more servings of fruits and vegetables.  Biometrics: Pre Biometrics - 06/24/18 1023      Pre Biometrics   Height  5\' 10"  (1.778 m)    Weight  73.2 kg    Waist Circumference  35 inches    Hip Circumference  38 inches    Waist to Hip Ratio  0.92 %    BMI (Calculated)  23.16    Triceps Skinfold  22 mm    % Body Fat  24.8 %    Grip Strength  42 kg    Flexibility  14 in    Single Leg Stand  18.31 seconds        Nutrition Therapy Plan and Nutrition Goals: Nutrition Therapy & Goals - 06/24/18 1006      Nutrition Therapy   Diet  heart healthy      Personal Nutrition Goals   Nutrition Goal  Pt to identify and limit  food sources of saturated fat, trans fat, refined carbohydrates and sodium    Personal Goal #2  Pt to decrease number of meals eaten away from home    Personal Goal #3  Pt to try 1 new recipe a week      Intervention Plan   Intervention  Prescribe, educate and counsel regarding individualized specific dietary modifications aiming towards targeted core components such as weight, hypertension, lipid management, diabetes, heart failure and other comorbidities.    Expected Outcomes  Short Term Goal: Understand basic principles of dietary content, such as calories, fat, sodium, cholesterol and nutrients.;Long Term Goal: Adherence to prescribed nutrition plan.       Nutrition Assessments: Nutrition Assessments - 06/24/18 1006      MEDFICTS Scores   Pre Score  18       Nutrition Goals Re-Evaluation:   Nutrition Goals Re-Evaluation:   Nutrition Goals Discharge (Final Nutrition Goals  Re-Evaluation):   Psychosocial: Target Goals: Acknowledge presence or absence of significant depression and/or stress, maximize coping skills, provide positive support system. Participant is able to verbalize types and ability to use techniques and skills needed for reducing stress and depression.  Initial Review & Psychosocial Screening: Initial Psych Review & Screening - 06/24/18 1151      Initial Review   Current issues with  None Identified      Family Dynamics   Good Support System?  Yes   Pt lists his wife as a source of support.      Barriers   Psychosocial barriers to participate in program  There are no identifiable barriers or psychosocial needs.      Screening Interventions   Interventions  Encouraged to exercise       Quality of Life Scores: Quality of Life - 06/24/18 1024      Quality of Life   Select  Quality of Life      Quality of Life Scores   Health/Function Pre  25.6 %    Socioeconomic Pre  24.56 %    Psych/Spiritual Pre  28.79 %    Family Pre  28.8 %    GLOBAL Pre  26.46 %      Scores of 19 and below usually indicate a poorer quality of life in these areas.  A difference of  2-3 points is a clinically meaningful difference.  A difference of 2-3 points in the total score of the Quality of Life Index has been associated with significant improvement in overall quality of life, self-image, physical symptoms, and general health in studies assessing change in quality of life.  PHQ-9: Recent Review Flowsheet Data    Depression screen [facility] 2/9 05/22/2014   Decreased Interest 0   Down, Depressed, Hopeless 0   PHQ - 2 Score 0     Interpretation of Total Score  Total Score Depression Severity:  1-4 = Minimal depression, 5-9 = Mild depression, 10-14 = Moderate depression, 15-19 = Moderately severe depression, 20-27 = Severe depression   Psychosocial Evaluation and Intervention:   Psychosocial Re-Evaluation:   Psychosocial Discharge (Final Psychosocial  Re-Evaluation):   Vocational Rehabilitation: Provide vocational rehab assistance to qualifying candidates.   Vocational Rehab Evaluation & Intervention: Vocational Rehab - 06/24/18 1152      Initial Vocational Rehab Evaluation & Intervention   Assessment shows need for Vocational Rehabilitation  Yes       Education: Education Goals: Education classes will be provided on a weekly basis, covering required topics. Participant will state understanding/return demonstration of topics  presented.  Learning Barriers/Preferences: Learning Barriers/Preferences - 06/24/18 1025      Learning Barriers/Preferences   Learning Barriers  Sight;Hearing    Learning Preferences  Verbal Instruction;Audio;Pictoral;Video;Written Material       Education Topics: Count Your Pulse:  -Group instruction provided by verbal instruction, demonstration, patient participation and written materials to support subject.  Instructors address importance of being able to find your pulse and how to count your pulse when at home without a heart monitor.  Patients get hands on experience counting their pulse with staff help and individually.   Heart Attack, Angina, and Risk Factor Modification:  -Group instruction provided by verbal instruction, video, and written materials to support subject.  Instructors address signs and symptoms of angina and heart attacks.    Also discuss risk factors for heart disease and how to make changes to improve heart health risk factors.   Functional Fitness:  -Group instruction provided by verbal instruction, demonstration, patient participation, and written materials to support subject.  Instructors address safety measures for doing things around the house.  Discuss how to get up and down off the floor, how to pick things up properly, how to safely get out of a chair without assistance, and balance training.   Meditation and Mindfulness:  -Group instruction provided by verbal  instruction, patient participation, and written materials to support subject.  Instructor addresses importance of mindfulness and meditation practice to help reduce stress and improve awareness.  Instructor also leads participants through a meditation exercise.    Stretching for Flexibility and Mobility:  -Group instruction provided by verbal instruction, patient participation, and written materials to support subject.  Instructors lead participants through series of stretches that are designed to increase flexibility thus improving mobility.  These stretches are additional exercise for major muscle groups that are typically performed during regular warm up and cool down.   Hands Only CPR:  -Group verbal, video, and participation provides a basic overview of AHA guidelines for community CPR. Role-play of emergencies allow participants the opportunity to practice calling for help and chest compression technique with discussion of AED use.   Hypertension: -Group verbal and written instruction that provides a basic overview of hypertension including the most recent diagnostic guidelines, risk factor reduction with self-care instructions and medication management.    Nutrition I class: Heart Healthy Eating:  -Group instruction provided by PowerPoint slides, verbal discussion, and written materials to support subject matter. The instructor gives an explanation and review of the Therapeutic Lifestyle Changes diet recommendations, which includes a discussion on lipid goals, dietary fat, sodium, fiber, plant stanol/sterol esters, sugar, and the components of a well-balanced, healthy diet.   Nutrition II class: Lifestyle Skills:  -Group instruction provided by PowerPoint slides, verbal discussion, and written materials to support subject matter. The instructor gives an explanation and review of label reading, grocery shopping for heart health, heart healthy recipe modifications, and ways to make  healthier choices when eating out.   Diabetes Question & Answer:  -Group instruction provided by PowerPoint slides, verbal discussion, and written materials to support subject matter. The instructor gives an explanation and review of diabetes co-morbidities, pre- and post-prandial blood glucose goals, pre-exercise blood glucose goals, signs, symptoms, and treatment of hypoglycemia and hyperglycemia, and foot care basics.   Diabetes Blitz:  -Group instruction provided by PowerPoint slides, verbal discussion, and written materials to support subject matter. The instructor gives an explanation and review of the physiology behind type 1 and type 2 diabetes, diabetes medications and rational  behind using different medications, pre- and post-prandial blood glucose recommendations and Hemoglobin A1c goals, diabetes diet, and exercise including blood glucose guidelines for exercising safely.    Portion Distortion:  -Group instruction provided by PowerPoint slides, verbal discussion, written materials, and food models to support subject matter. The instructor gives an explanation of serving size versus portion size, changes in portions sizes over the last 20 years, and what consists of a serving from each food group.   Stress Management:  -Group instruction provided by verbal instruction, video, and written materials to support subject matter.  Instructors review role of stress in heart disease and how to cope with stress positively.     Exercising on Your Own:  -Group instruction provided by verbal instruction, power point, and written materials to support subject.  Instructors discuss benefits of exercise, components of exercise, frequency and intensity of exercise, and end points for exercise.  Also discuss use of nitroglycerin and activating EMS.  Review options of places to exercise outside of rehab.  Review guidelines for sex with heart disease.   Cardiac Drugs I:  -Group instruction provided by  verbal instruction and written materials to support subject.  Instructor reviews cardiac drug classes: antiplatelets, anticoagulants, beta blockers, and statins.  Instructor discusses reasons, side effects, and lifestyle considerations for each drug class.   Cardiac Drugs II:  -Group instruction provided by verbal instruction and written materials to support subject.  Instructor reviews cardiac drug classes: angiotensin converting enzyme inhibitors (ACE-I), angiotensin II receptor blockers (ARBs), nitrates, and calcium channel blockers.  Instructor discusses reasons, side effects, and lifestyle considerations for each drug class.   Anatomy and Physiology of the Circulatory System:  Group verbal and written instruction and models provide basic cardiac anatomy and physiology, with the coronary electrical and arterial systems. Review of: AMI, Angina, Valve disease, Heart Failure, Peripheral Artery Disease, Cardiac Arrhythmia, Pacemakers, and the ICD.   Other Education:  -Group or individual verbal, written, or video instructions that support the educational goals of the cardiac rehab program.   Holiday Eating Survival Tips:  -Group instruction provided by PowerPoint slides, verbal discussion, and written materials to support subject matter. The instructor gives patients tips, tricks, and techniques to help them not only survive but enjoy the holidays despite the onslaught of food that accompanies the holidays.   Knowledge Questionnaire Score: Knowledge Questionnaire Score - 06/24/18 1026      Knowledge Questionnaire Score   Pre Score  22/24       Core Components/Risk Factors/Patient Goals at Admission: Personal Goals and Risk Factors at Admission - 06/24/18 1026      Core Components/Risk Factors/Patient Goals on Admission    Weight Management  Yes;Weight Maintenance    Intervention  Weight Management: Develop a combined nutrition and exercise program designed to reach desired caloric  intake, while maintaining appropriate intake of nutrient and fiber, sodium and fats, and appropriate energy expenditure required for the weight goal.;Weight Management: Provide education and appropriate resources to help participant work on and attain dietary goals.    Admit Weight  161 lb 6 oz (73.2 kg)    Expected Outcomes  Short Term: Continue to assess and modify interventions until short term weight is achieved;Long Term: Adherence to nutrition and physical activity/exercise program aimed toward attainment of established weight goal;Weight Maintenance: Understanding of the daily nutrition guidelines, which includes 25-35% calories from fat, 7% or less cal from saturated fats, less than 200mg  cholesterol, less than 1.5gm of sodium, & 5 or more servings  of fruits and vegetables daily;Understanding recommendations for meals to include 15-35% energy as protein, 25-35% energy from fat, 35-60% energy from carbohydrates, less than 200mg  of dietary cholesterol, 20-35 gm of total fiber daily;Understanding of distribution of calorie intake throughout the day with the consumption of 4-5 meals/snacks    Hypertension  Yes    Intervention  Provide education on lifestyle modifcations including regular physical activity/exercise, weight management, moderate sodium restriction and increased consumption of fresh fruit, vegetables, and low fat dairy, alcohol moderation, and smoking cessation.;Monitor prescription use compliance.    Expected Outcomes  Short Term: Continued assessment and intervention until BP is < 140/58mm HG in hypertensive participants. < 130/6mm HG in hypertensive participants with diabetes, heart failure or chronic kidney disease.;Long Term: Maintenance of blood pressure at goal levels.    Lipids  Yes    Intervention  Provide education and support for participant on nutrition & aerobic/resistive exercise along with prescribed medications to achieve LDL 70mg , HDL >40mg .    Expected Outcomes  Short  Term: Participant states understanding of desired cholesterol values and is compliant with medications prescribed. Participant is following exercise prescription and nutrition guidelines.;Long Term: Cholesterol controlled with medications as prescribed, with individualized exercise RX and with personalized nutrition plan. Value goals: LDL < 70mg , HDL > 40 mg.    Stress  Yes    Intervention  Offer individual and/or small group education and counseling on adjustment to heart disease, stress management and health-related lifestyle change. Teach and support self-help strategies.;Refer participants experiencing significant psychosocial distress to appropriate mental health specialists for further evaluation and treatment. When possible, include family members and significant others in education/counseling sessions.    Expected Outcomes  Short Term: Participant demonstrates changes in health-related behavior, relaxation and other stress management skills, ability to obtain effective social support, and compliance with psychotropic medications if prescribed.;Long Term: Emotional wellbeing is indicated by absence of clinically significant psychosocial distress or social isolation.       Core Components/Risk Factors/Patient Goals Review:    Core Components/Risk Factors/Patient Goals at Discharge (Final Review):    ITP Comments: ITP Comments    Row Name 06/24/18 0927           ITP Comments  Dr. Fransico Him, Medical Director          Comments: Patient attended orientation from [phone] to [phone] to review rules and guidelines for program. Completed 6 minute walk test, Intitial ITP, and exercise prescription.  VSS. Telemetry-SR.  Asymptomatic.

## 2018-06-24 NOTE — Progress Notes (Signed)
DAEVON HOLDREN 62 y.o. male DOB: [birth year] MRN: [id number]      Nutrition Note  1. Status post coronary artery stent placement    Past Medical History:  Diagnosis Date  . Asthma   . Cancer ([place]) 2015   colon cancer  . Esophageal reflux   . Hypercholesterolemia   . Hypertension   . Myocardial infarction ([place]) 2015   Meds reviewed.    Current Outpatient Medications (Cardiovascular):  .  bisoprolol (ZEBETA) 5 MG tablet, Take 1 tablet (5 mg total) by mouth daily. [name] Kitchen  EPIPEN 2-PAK 0.3 MG/0.3ML SOAJ injection, Inject 0.3 mg into the skin once.  .  ezetimibe (ZETIA) 10 MG tablet, Take 1 tablet (10 mg total) by mouth daily. [name] Kitchen  lisinopril (PRINIVIL,ZESTRIL) 5 MG tablet, Take 0.5 tablets (2.5 mg total) by mouth daily. .  nitroGLYCERIN (NITROSTAT) 0.4 MG SL tablet, Place 1 tablet (0.4 mg total) under the tongue every 5 (five) minutes x 3 doses as needed for chest pain. .  rosuvastatin (CRESTOR) 40 MG tablet, Take 1 tablet (40 mg total) by mouth daily.  Current Outpatient Medications (Respiratory):  .  fluticasone (FLOVENT HFA) 110 MCG/ACT inhaler, Inhale 1 puff into the lungs 2 (two) times daily. (Patient taking differently: Inhale 1 puff into the lungs 2 (two) times daily as needed (shortness of breath). ) .  levalbuterol (XOPENEX HFA) 45 MCG/ACT inhaler, Inhale 1-2 puffs into the lungs every 6 (six) hours as needed for wheezing.  Current Outpatient Medications (Analgesics):  .  acetaminophen (TYLENOL) 500 MG tablet, Take 500 mg by mouth every 8 (eight) hours as needed for mild pain.  [name] Kitchen  aspirin EC 81 MG tablet, Take 1 tablet (81 mg total) by mouth daily.  Current Outpatient Medications (Hematological):  .  clopidogrel (PLAVIX) 75 MG tablet, Take 1 tablet (75 mg total) by mouth daily.  Current Outpatient Medications (Other):  .  pantoprazole (PROTONIX) 40 MG tablet, Take 1 tablet (40 mg total) by mouth daily.   HT: Ht Readings from Last 1 Encounters:  05/19/18 5\' 10"  (1.778 m)     WT: Wt Readings from Last 5 Encounters:  06/02/18 154 lb (69.9 kg)  05/19/18 160 lb (72.6 kg)  05/18/18 160 lb 3.2 oz (72.7 kg)  01/01/18 164 lb 8 oz (74.6 kg)  02/12/17 169 lb 1.9 oz (76.7 kg)     There is no height or weight on file to calculate BMI.   Current tobacco use? No  Labs:  Lipid Panel     Component Value Date/Time   CHOL 139 05/18/2018 1621   TRIG 119 05/18/2018 1621   HDL 35 (L) 05/18/2018 1621   CHOLHDL 4.0 05/18/2018 1621   VLDL 24 05/18/2018 1621   LDLCALC 80 05/18/2018 1621    Lab Results  Component Value Date   HGBA1C 5.4 01/01/2018   CBG (last 3)  No results for input(s): GLUCAP in the last 72 hours.  Nutrition Note Spoke with pt. Nutrition plan and goals reviewed with pt. Pt is following Step 2 of the Therapeutic Lifestyle Changes diet. Pt wants to focus on heart healthy eating and lifestyle while in cardiac rehab. Heart healthy eating tips reviewed (label reading, how to build a healthy plate, portion sizes, eating frequently across the day). Pt recently had DES placed. Per discussion, pt does not use canned/convenience foods often. Pt does not add salt to food. Pt does eat out frequently, ~7 times a week, or daily. While patient makes healthy choices when eating out  discussed that it is hard to control sodium, trans fat, and saturated fat when you are not cooking yourself. Pt expressed willingness to make changes, but would like some guidance. Discussed meal prepping and planning, as well as batch cooking. Distributed recipes for meal prepping/batch cooking for dinner, lunch, as well as breakfast. Set goal with patient to try 1 new recipe a week, and reduce the number of meals eaten away from home. Pt expressed understanding of the information reviewed. Pt aware of nutrition education classes offered and would like to attend nutrition classes.  Nutrition Diagnosis ? Food-and nutrition-related knowledge deficit related to lack of exposure to information as  related to diagnosis of: ? CVD   Nutrition Intervention ? Pt's individual nutrition plan and goals reviewed with pt. ? Pt given handouts for: ? Nutrition I class ? Nutrition II class   Nutrition Goal(s):  ? Pt to identify and limit food sources of saturated fat, trans fat, refined carbohydrates and sodium ? Pt to decrease number of meals eaten away from home ? Pt to try 1 new recipe a week  Plan:  ? Pt to attend nutrition classes ? Nutrition I ? Nutrition II ? Portion Distortion  ? Will provide client-centered nutrition education as part of interdisciplinary care ? Monitor and evaluate progress toward nutrition goal with team.   Laurina Bustle, MS, RD, LDN 06/24/2018 10:01 AM

## 2018-06-24 NOTE — Progress Notes (Signed)
Jerry Montoya 62 y.o. male DOB 05-Oct-1955 MRN [id number]       Nutrition  No diagnosis found. Past Medical History:  Diagnosis Date  . Asthma   . Cancer ([place]) 2015   colon cancer  . Esophageal reflux   . Hypercholesterolemia   . Hypertension   . Myocardial infarction ([place]) 2015   Meds reviewed.     Current Outpatient Medications (Cardiovascular):  .  bisoprolol (ZEBETA) 5 MG tablet, Take 1 tablet (5 mg total) by mouth daily. [name] Kitchen  EPIPEN 2-PAK 0.3 MG/0.3ML SOAJ injection, Inject 0.3 mg into the skin once.  .  ezetimibe (ZETIA) 10 MG tablet, Take 1 tablet (10 mg total) by mouth daily. [name] Kitchen  lisinopril (PRINIVIL,ZESTRIL) 5 MG tablet, Take 0.5 tablets (2.5 mg total) by mouth daily. .  nitroGLYCERIN (NITROSTAT) 0.4 MG SL tablet, Place 1 tablet (0.4 mg total) under the tongue every 5 (five) minutes x 3 doses as needed for chest pain. .  rosuvastatin (CRESTOR) 40 MG tablet, Take 1 tablet (40 mg total) by mouth daily.  Current Outpatient Medications (Respiratory):  .  fluticasone (FLOVENT HFA) 110 MCG/ACT inhaler, Inhale 1 puff into the lungs 2 (two) times daily. (Patient taking differently: Inhale 1 puff into the lungs 2 (two) times daily as needed (shortness of breath). ) .  levalbuterol (XOPENEX HFA) 45 MCG/ACT inhaler, Inhale 1-2 puffs into the lungs every 6 (six) hours as needed for wheezing.  Current Outpatient Medications (Analgesics):  .  acetaminophen (TYLENOL) 500 MG tablet, Take 500 mg by mouth every 8 (eight) hours as needed for mild pain.  [name] Kitchen  aspirin EC 81 MG tablet, Take 1 tablet (81 mg total) by mouth daily.  Current Outpatient Medications (Hematological):  .  clopidogrel (PLAVIX) 75 MG tablet, Take 1 tablet (75 mg total) by mouth daily.  Current Outpatient Medications (Other):  .  pantoprazole (PROTONIX) 40 MG tablet, Take 1 tablet (40 mg total) by mouth daily.   HT: Ht Readings from Last 1 Encounters:  05/19/18 5\' 10"  (1.778 m)    WT: Wt Readings from Last 5  Encounters:  06/02/18 154 lb (69.9 kg)  05/19/18 160 lb (72.6 kg)  05/18/18 160 lb 3.2 oz (72.7 kg)  01/01/18 164 lb 8 oz (74.6 kg)  02/12/17 169 lb 1.9 oz (76.7 kg)    Current tobacco use? No       Labs:  Lipid Panel     Component Value Date/Time   CHOL 139 05/18/2018 1621   TRIG 119 05/18/2018 1621   HDL 35 (L) 05/18/2018 1621   CHOLHDL 4.0 05/18/2018 1621   VLDL 24 05/18/2018 1621   LDLCALC 80 05/18/2018 1621    Lab Results  Component Value Date   HGBA1C 5.4 01/01/2018   CBG (last 3)  No results for input(s): GLUCAP in the last 72 hours.  Nutrition Diagnosis ? Food-and nutrition-related knowledge deficit related to lack of exposure to information as related to diagnosis of: ? CVD  Nutrition Goal(s):  ? To be determined  Plan:  Pt to attend nutrition classes ? Nutrition I ? Nutrition II ? Portion Distortion  Will provide client-centered nutrition education as part of interdisciplinary care.   Monitor and evaluate progress toward nutrition goal with team.  Laurina Bustle, MS, RD, LDN 06/24/2018 7:47 AM

## 2018-06-25 ENCOUNTER — Ambulatory Visit (HOSPITAL_COMMUNITY)
Admission: RE | Admit: 2018-06-25 | Discharge: 2018-06-25 | Disposition: A | Discharge status: Home / Self Care | Payer: 59 | Source: Ambulatory Visit | Attending: Cardiology | Admitting: Cardiology

## 2018-06-25 DIAGNOSIS — I2510 Atherosclerotic heart disease of native coronary artery without angina pectoris: Secondary | ICD-10-CM | POA: Diagnosis not present

## 2018-06-25 DIAGNOSIS — I509 Heart failure, unspecified: Secondary | ICD-10-CM | POA: Diagnosis not present

## 2018-06-25 DIAGNOSIS — I255 Ischemic cardiomyopathy: Secondary | ICD-10-CM | POA: Diagnosis not present

## 2018-06-25 NOTE — Progress Notes (Signed)
  Echocardiogram 2D Echocardiogram has been performed.  Johny Chess 06/25/2018, 9:53 AM

## 2018-06-30 ENCOUNTER — Ambulatory Visit (HOSPITAL_COMMUNITY): Payer: 59

## 2018-06-30 ENCOUNTER — Encounter (HOSPITAL_COMMUNITY)
Admission: RE | Admit: 2018-06-30 | Discharge: 2018-06-30 | Disposition: A | Discharge status: Home / Self Care | Payer: 59 | Source: Ambulatory Visit | Attending: Cardiology | Admitting: Cardiology

## 2018-06-30 ENCOUNTER — Encounter (HOSPITAL_COMMUNITY): Payer: Self-pay

## 2018-06-30 DIAGNOSIS — Z955 Presence of coronary angioplasty implant and graft: Secondary | ICD-10-CM

## 2018-06-30 NOTE — Progress Notes (Cosign Needed)
Pulmonary Individual Treatment Plan  Patient Details  Name: Jerry Montoya MRN: [id number] Date of Birth: [birth year] Referring Provider:   Flowsheet Row CARDIAC REHAB PHASE II ORIENTATION from 06/24/2018 in [place]  Referring Provider  Dr. Aundra Dubin      Initial Encounter Date:  Flowsheet Row CARDIAC REHAB PHASE II ORIENTATION from 06/24/2018 in [place]  Date  06/24/18      Visit Diagnosis: Status post coronary artery stent placement  Patient's Home Medications on Admission:   Current Outpatient Medications:  .  acetaminophen (TYLENOL) 500 MG tablet, Take 500 mg by mouth every 8 (eight) hours as needed for mild pain. , Disp: , Rfl:  .  aspirin EC 81 MG tablet, Take 1 tablet (81 mg total) by mouth daily., Disp: 90 tablet, Rfl: 3 .  bisoprolol (ZEBETA) 5 MG tablet, Take 1 tablet (5 mg total) by mouth daily., Disp: 90 tablet, Rfl: 3 .  clopidogrel (PLAVIX) 75 MG tablet, Take 1 tablet (75 mg total) by mouth daily., Disp: 90 tablet, Rfl: 3 .  EPIPEN 2-PAK 0.3 MG/0.3ML SOAJ injection, Inject 0.3 mg into the skin once. , Disp: , Rfl: 0 .  ezetimibe (ZETIA) 10 MG tablet, Take 1 tablet (10 mg total) by mouth daily., Disp: 90 tablet, Rfl: 3 .  fluticasone (FLOVENT HFA) 110 MCG/ACT inhaler, Inhale 1 puff into the lungs 2 (two) times daily. (Patient taking differently: Inhale 1 puff into the lungs 2 (two) times daily as needed (shortness of breath). ), Disp: 1 Inhaler, Rfl: 12 .  levalbuterol (XOPENEX HFA) 45 MCG/ACT inhaler, Inhale 1-2 puffs into the lungs every 6 (six) hours as needed for wheezing., Disp: 1 Inhaler, Rfl: 12 .  lisinopril (PRINIVIL,ZESTRIL) 5 MG tablet, Take 0.5 tablets (2.5 mg total) by mouth daily., Disp: 45 tablet, Rfl: 3 .  nitroGLYCERIN (NITROSTAT) 0.4 MG SL tablet, Place 1 tablet (0.4 mg total) under the tongue every 5 (five) minutes x 3 doses as needed for chest pain., Disp: 25 tablet, Rfl: 5 .  pantoprazole  (PROTONIX) 40 MG tablet, Take 1 tablet (40 mg total) by mouth daily., Disp: 90 tablet, Rfl: 3 .  rosuvastatin (CRESTOR) 40 MG tablet, Take 1 tablet (40 mg total) by mouth daily., Disp: 90 tablet, Rfl: 3  Past Medical History: Past Medical History:  Diagnosis Date  . Asthma   . Cancer ([place]) 2015   colon cancer  . Esophageal reflux   . Hypercholesterolemia   . Hypertension   . Myocardial infarction ([place]) 2015    Tobacco Use: Social History   Tobacco Use  Smoking Status Never Smoker  Smokeless Tobacco Never Used    Labs: Recent Review Flowsheet Data    Labs for ITP Cardiac and Pulmonary Rehab Latest Ref Rng & Units 05/02/2015 11/26/2015 02/12/2017 01/01/2018 05/18/2018   Cholestrol 0 - 200 mg/dL 182 123(L) 139 128 139   LDLCALC 0 - 99 mg/dL 125 77 86 82 80   HDL >40 mg/dL 30(L) 31(L) 36(L) 34(L) 35(L)   Trlycerides <150 mg/dL 137 75 83 62 119   Hemoglobin A1c 4.8 - 5.6 % - - - 5.4 -      Capillary Blood Glucose: Lab Results  Component Value Date   GLUCAP 104 (H) 03/24/2014     Pulmonary Assessment Scores:   Pulmonary Function Assessment:   Exercise Target Goals: Exercise Program Goal: Individual exercise prescription set using results from initial 6 min walk test and THRR while considering  patient's activity barriers and safety.   Exercise Prescription Goal: Initial exercise prescription builds to 30-45 minutes a day of aerobic activity, 2-3 days per week.  Home exercise guidelines will be given to patient during program as part of exercise prescription that the participant will acknowledge.  Activity Barriers & Risk Stratification: Activity Barriers & Cardiac Risk Stratification - 06/24/18 1020    Activity Barriers & Cardiac Risk Stratification          Activity Barriers  None    Cardiac Risk Stratification  Moderate           6 Minute Walk: 6 Minute Walk    6 Minute Walk    Row Name 06/24/18 1019   Phase  Initial   Distance  2032 feet   Walk Time  6  minutes   # of Rest Breaks  0   MPH  3.85   METS  4.69   RPE  12   VO2 Peak  16.41   Symptoms  No   Resting HR  68 bpm   Resting BP  108/58   Resting Oxygen Saturation   96 %   Exercise Oxygen Saturation  during 6 min walk  96 %   Max Ex. HR  92 bpm   Max Ex. BP  122/70   2 Minute Post BP  118/62          Oxygen Initial Assessment:   Oxygen Re-Evaluation:   Oxygen Discharge (Final Oxygen Re-Evaluation):   Initial Exercise Prescription: Initial Exercise Prescription - 06/24/18 1000    Date of Initial Exercise RX and Referring Provider          Date  06/24/18    Referring Provider  Dr. Aundra Dubin    Expected Discharge Date  09/29/18        Treadmill          MPH  3.4    Grade  0    Minutes  10        Recumbant Bike          Level  2    Watts  60    Minutes  10    METs  4.52        NuStep          Level  3    SPM  85    Minutes  10    METs  4.4        Prescription Details          Frequency (times per week)  3    Duration  Progress to 30 minutes of continuous aerobic without signs/symptoms of physical distress        Intensity          THRR 40-80% of Max Heartrate  62-125    Ratings of Perceived Exertion  11-13        Progression          Progression  Continue to progress workloads to maintain intensity without signs/symptoms of physical distress.        Resistance Training          Training Prescription  Yes    Weight  3 lbs.     Reps  10-15           Perform Capillary Blood Glucose checks as needed.  Exercise Prescription Changes: Exercise Prescription Changes    Response to Exercise    Row Name 06/30/18 0900   Blood Pressure (Admit)  112/66  Blood Pressure (Exercise)  124/72   Blood Pressure (Exit)  110/70   Heart Rate (Admit)  81 bpm   Heart Rate (Exercise)  107 bpm   Heart Rate (Exit)  78 bpm   Rating of Perceived Exertion (Exercise)  12   Comments  Pt first day of exercise   Duration  Progress to 30 minutes of   aerobic without signs/symptoms of physical distress   Intensity  THRR unchanged       Progression    Row Name 06/30/18 0900   Progression  Continue to progress workloads to maintain intensity without signs/symptoms of physical distress.   Average METs  3.7       Resistance Training    Row Name 06/30/18 0900   Training Prescription  No       Treadmill    Row Name 06/30/18 0900   MPH  3.4   Grade  0   Minutes  10       Recumbant Bike    Row Name 06/30/18 0900   Level  2   Watts  60   Minutes  10   METs  2.8       NuStep    Row Name 06/30/18 0900   Level  3   SPM  85   Minutes  10   METs  4.8          Exercise Comments: Exercise Comments    Row Name 06/30/18 0935   Exercise Comments  Pt first day of exercise. Tolerated exercise prescription well.       Exercise Goals and Review: Exercise Goals    Exercise Goals    Row Name 06/24/18 1022   Increase Physical Activity  Yes   Intervention  Provide advice, education, support and counseling about physical activity/exercise needs.;Develop an individualized exercise prescription for aerobic and resistive training based on initial evaluation findings, risk stratification, comorbidities and participant's personal goals.   Expected Outcomes  Short Term: Attend rehab on a regular basis to increase amount of physical activity.   Increase Strength and Stamina  Yes   Intervention  Provide advice, education, support and counseling about physical activity/exercise needs.;Develop an individualized exercise prescription for aerobic and resistive training based on initial evaluation findings, risk stratification, comorbidities and participant's personal goals.   Expected Outcomes  Short Term: Increase workloads from initial exercise prescription for resistance, speed, and METs.   Able to understand and use rate of perceived exertion (RPE) scale  Yes   Intervention  Provide education and explanation on how to use RPE scale    Expected Outcomes  Short Term: Able to use RPE daily in rehab to express subjective intensity level;Long Term:  Able to use RPE to guide intensity level when exercising independently   Knowledge and understanding of Target Heart Rate Range (THRR)  Yes   Intervention  Provide education and explanation of THRR including how the numbers were predicted and where they are located for reference   Expected Outcomes  Short Term: Able to state/look up THRR;Long Term: Able to use THRR to govern intensity when exercising independently;Short Term: Able to use daily as guideline for intensity in rehab   Able to check pulse independently  Yes   Intervention  Provide education and demonstration on how to check pulse in carotid and radial arteries.;Review the importance of being able to check your own pulse for safety during independent exercise   Expected Outcomes  Short Term: Able to explain why pulse checking is  important during independent exercise;Long Term: Able to check pulse independently and accurately   Understanding of Exercise Prescription  Yes   Intervention  Provide education, explanation, and written materials on patient's individual exercise prescription   Expected Outcomes  Short Term: Able to explain program exercise prescription;Long Term: Able to explain home exercise prescription to exercise independently          Exercise Goals Re-Evaluation : Exercise Goals Re-Evaluation    Exercise Goal Re-Evaluation    Row Name 06/30/18 0934   Exercise Goals Review  Increase Physical Activity;Increase Strength and Stamina;Able to understand and use rate of perceived exertion (RPE) scale;Knowledge and understanding of Target Heart Rate Range (THRR);Understanding of Exercise Prescription;Able to check pulse independently   Comments  Pt first day of exercise. Tolerated exercise prescription well. Will increase workloads as tolerated.    Expected Outcomes  Will continue to monitor and progress Pt as  tolerated.           Discharge Exercise Prescription (Final Exercise Prescription Changes): Exercise Prescription Changes - 06/30/18 0900    Response to Exercise          Blood Pressure (Admit)  112/66    Blood Pressure (Exercise)  124/72    Blood Pressure (Exit)  110/70    Heart Rate (Admit)  81 bpm    Heart Rate (Exercise)  107 bpm    Heart Rate (Exit)  78 bpm    Rating of Perceived Exertion (Exercise)  12    Comments  Pt first day of exercise    Duration  Progress to 30 minutes of  aerobic without signs/symptoms of physical distress    Intensity  THRR unchanged        Progression          Progression  Continue to progress workloads to maintain intensity without signs/symptoms of physical distress.    Average METs  3.7        Resistance Training          Training Prescription  No        Treadmill          MPH  3.4    Grade  0    Minutes  10        Recumbant Bike          Level  2    Watts  60    Minutes  10    METs  2.8        NuStep          Level  3    SPM  85    Minutes  10    METs  4.8           Nutrition:  Target Goals: Understanding of nutrition guidelines, daily intake of sodium 1500mg , cholesterol 200mg , calories 30% from fat and 7% or less from saturated fats, daily to have 5 or more servings of fruits and vegetables.  Biometrics: Pre Biometrics - 06/24/18 1023    Pre Biometrics          Height  5\' 10"  (1.778 m)    Weight  73.2 kg    Waist Circumference  35 inches    Hip Circumference  38 inches    Waist to Hip Ratio  0.92 %    BMI (Calculated)  23.16    Triceps Skinfold  22 mm    % Body Fat  24.8 %    Grip Strength  42 kg    Flexibility  14 in    Single Leg Stand  18.31 seconds            Nutrition Therapy Plan and Nutrition Goals: Nutrition Therapy & Goals - 06/24/18 1006    Nutrition Therapy          Diet  heart healthy        Personal Nutrition Goals          Nutrition Goal  Pt to identify and limit food  sources of saturated fat, trans fat, refined carbohydrates and sodium    Personal Goal #2  Pt to decrease number of meals eaten away from home    Personal Goal #3  Pt to try 1 new recipe a week        Intervention Plan          Intervention  Prescribe, educate and counsel regarding individualized specific dietary modifications aiming towards targeted core components such as weight, hypertension, lipid management, diabetes, heart failure and other comorbidities.    Expected Outcomes  Short Term Goal: Understand basic principles of dietary content, such as calories, fat, sodium, cholesterol and nutrients.;Long Term Goal: Adherence to prescribed nutrition plan.           Nutrition Assessments: Nutrition Assessments - 06/24/18 1006    MEDFICTS Scores          Pre Score  18           Nutrition Goals Re-Evaluation:   Nutrition Goals Discharge (Final Nutrition Goals Re-Evaluation):   Psychosocial: Target Goals: Acknowledge presence or absence of significant depression and/or stress, maximize coping skills, provide positive support system. Participant is able to verbalize types and ability to use techniques and skills needed for reducing stress and depression.  Initial Review & Psychosocial Screening: Initial Psych Review & Screening - 06/24/18 1151    Initial Review          Current issues with  None Identified        Family Dynamics          Good Support System?  Yes   Pt lists his wife as a source of support.        Barriers          Psychosocial barriers to participate in program  There are no identifiable barriers or psychosocial needs.        Screening Interventions          Interventions  Encouraged to exercise           Quality of Life Scores: Quality of Life - 06/24/18 1024    Quality of Life          Select  Quality of Life        Quality of Life Scores          Health/Function Pre  25.6 %    Socioeconomic Pre  24.56 %    Psych/Spiritual Pre   28.79 %    Family Pre  28.8 %    GLOBAL Pre  26.46 %          Scores of 19 and below usually indicate a poorer quality of life in these areas.  A difference of  2-3 points is a clinically meaningful difference.  A difference of 2-3 points in the total score of the Quality of Life Index has been associated with significant improvement in overall quality of life, self-image, physical symptoms, and general health in studies assessing change in quality of life.  PHQ-9: Recent Review Flowsheet  Data    Depression screen [facility] 2/9 06/30/2018 05/22/2014   Decreased Interest 0 0   Down, Depressed, Hopeless 0 0   PHQ - 2 Score 0 0     Interpretation of Total Score  Total Score Depression Severity:  1-4 = Minimal depression, 5-9 = Mild depression, 10-14 = Moderate depression, 15-19 = Moderately severe depression, 20-27 = Severe depression   Psychosocial Evaluation and Intervention: Psychosocial Evaluation - 06/30/18 0919    Psychosocial Evaluation & Interventions          Interventions  Encouraged to exercise with the program and follow exercise prescription;Stress management education;Relaxation education    Comments  pt c/o work related stress and anxiety. pt unhappy with current job. 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 pt also concerned about his wifes current state of health.  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 pt currently walking for exercise.     Expected Outcomes  pt will exhibit improved outlook and coping skills.     Continue Psychosocial Services   Follow up required by staff           Psychosocial Re-Evaluation:   Psychosocial Discharge (Final Psychosocial Re-Evaluation):   Education: Education Goals: Education classes will be provided on a weekly basis, covering required topics. Participant will state understanding/return demonstration of topics presented.  Learning Barriers/Preferences: Learning  Barriers/Preferences - 06/24/18 1025    Learning Barriers/Preferences          Learning Barriers  Sight;Hearing    Learning Preferences  Verbal Instruction;Audio;Pictoral;Video;Written Material           Education Topics: Risk Factor Reduction:  -Group instruction that is supported by a PowerPoint presentation. Instructor discusses the definition of a risk factor, different risk factors for pulmonary disease, and how the heart and lungs work together.     Nutrition for Pulmonary Patient:  -Group instruction provided by PowerPoint slides, verbal discussion, and written materials to support subject matter. The instructor gives an explanation and review of healthy diet recommendations, which includes a discussion on weight management, recommendations for fruit and vegetable consumption, as well as protein, fluid, caffeine, fiber, sodium, sugar, and alcohol. Tips for eating when patients are short of breath are discussed.   Pursed Lip Breathing:  -Group instruction that is supported by demonstration and informational handouts. Instructor discusses the benefits of pursed lip and diaphragmatic breathing and detailed demonstration on how to preform both.     Oxygen Safety:  -Group instruction provided by PowerPoint, verbal discussion, and written material to support subject matter. There is an overview of "What is Oxygen" and "Why do we need it".  Instructor also reviews how to create a safe environment for oxygen use, the importance of using oxygen as prescribed, and the risks of noncompliance. There is a brief discussion on traveling with oxygen and resources the patient may utilize.   Oxygen Equipment:  -Group instruction provided by [facility] Staff utilizing handouts, written materials, and equipment demonstrations.   Signs and Symptoms:  -Group instruction provided by written material and verbal discussion to support subject matter. Warning signs and symptoms of infection, stroke, and  heart attack are reviewed and when to call the physician/911 reinforced. Tips for preventing the spread of infection discussed.   Advanced Directives:  -Group instruction provided by verbal instruction and written material to support subject matter. Instructor reviews Advanced Directive laws and proper instruction for filling out document.   Pulmonary Video:  -  Group video education that reviews the importance of medication and oxygen compliance, exercise, good nutrition, pulmonary hygiene, and pursed lip and diaphragmatic breathing for the pulmonary patient.   Exercise for the Pulmonary Patient:  -Group instruction that is supported by a PowerPoint presentation. Instructor discusses benefits of exercise, core components of exercise, frequency, duration, and intensity of an exercise routine, importance of utilizing pulse oximetry during exercise, safety while exercising, and options of places to exercise outside of rehab.     Pulmonary Medications:  -Verbally interactive group education provided by instructor with focus on inhaled medications and proper administration.   Anatomy and Physiology of the Respiratory System and Intimacy:  -Group instruction provided by PowerPoint, verbal discussion, and written material to support subject matter. Instructor reviews respiratory cycle and anatomical components of the respiratory system and their functions. Instructor also reviews differences in obstructive and restrictive respiratory diseases with examples of each. Intimacy, Sex, and Sexuality differences are reviewed with a discussion on how relationships can change when diagnosed with pulmonary disease. Common sexual concerns are reviewed.   MD DAY -A group question and answer session with a medical doctor that allows participants to ask questions that relate to their pulmonary disease state.   OTHER EDUCATION -Group or individual verbal, written, or video instructions that support the  educational goals of the pulmonary rehab program.   Holiday Eating Survival Tips:  -Group instruction provided by PowerPoint slides, verbal discussion, and written materials to support subject matter. The instructor gives patients tips, tricks, and techniques to help them not only survive but enjoy the holidays despite the onslaught of food that accompanies the holidays.   Knowledge Questionnaire Score: Knowledge Questionnaire Score - 06/24/18 1026    Knowledge Questionnaire Score          Pre Score  22/24           Core Components/Risk Factors/Patient Goals at Admission: Personal Goals and Risk Factors at Admission - 06/24/18 1026    Core Components/Risk Factors/Patient Goals on Admission           Weight Management  Yes;Weight Maintenance    Intervention  Weight Management: Develop a combined nutrition and exercise program designed to reach desired caloric intake, while maintaining appropriate intake of nutrient and fiber, sodium and fats, and appropriate energy expenditure required for the weight goal.;Weight Management: Provide education and appropriate resources to help participant work on and attain dietary goals.    Admit Weight  161 lb 6 oz (73.2 kg)    Expected Outcomes  Short Term: Continue to assess and modify interventions until short term weight is achieved;Long Term: Adherence to nutrition and physical activity/exercise program aimed toward attainment of established weight goal;Weight Maintenance: Understanding of the daily nutrition guidelines, which includes 25-35% calories from fat, 7% or less cal from saturated fats, less than 200mg  cholesterol, less than 1.5gm of sodium, & 5 or more servings of fruits and vegetables daily;Understanding recommendations for meals to include 15-35% energy as protein, 25-35% energy from fat, 35-60% energy from carbohydrates, less than 200mg  of dietary cholesterol, 20-35 gm of total fiber daily;Understanding of distribution of calorie intake  throughout the day with the consumption of 4-5 meals/snacks    Hypertension  Yes    Intervention  Provide education on lifestyle modifcations including regular physical activity/exercise, weight management, moderate sodium restriction and increased consumption of fresh fruit, vegetables, and low fat dairy, alcohol moderation, and smoking cessation.;Monitor prescription use compliance.    Expected Outcomes  Short Term: Continued assessment and  intervention until BP is < 140/32mm HG in hypertensive participants. < 130/60mm HG in hypertensive participants with diabetes, heart failure or chronic kidney disease.;Long Term: Maintenance of blood pressure at goal levels.    Lipids  Yes    Intervention  Provide education and support for participant on nutrition & aerobic/resistive exercise along with prescribed medications to achieve LDL 70mg , HDL >40mg .    Expected Outcomes  Short Term: Participant states understanding of desired cholesterol values and is compliant with medications prescribed. Participant is following exercise prescription and nutrition guidelines.;Long Term: Cholesterol controlled with medications as prescribed, with individualized exercise RX and with personalized nutrition plan. Value goals: LDL < 70mg , HDL > 40 mg.    Stress  Yes    Intervention  Offer individual and/or small group education and counseling on adjustment to heart disease, stress management and health-related lifestyle change. Teach and support self-help strategies.;Refer participants experiencing significant psychosocial distress to appropriate mental health specialists for further evaluation and treatment. When possible, include family members and significant others in education/counseling sessions.    Expected Outcomes  Short Term: Participant demonstrates changes in health-related behavior, relaxation and other stress management skills, ability to obtain effective social support, and compliance with psychotropic medications  if prescribed.;Long Term: Emotional wellbeing is indicated by absence of clinically significant psychosocial distress or social isolation.           Core Components/Risk Factors/Patient Goals Review:  Goals and Risk Factor Review    Core Components/Risk Factors/Patient Goals Review    Row Name 06/30/18 0921   Personal Goals Review  Weight Management/Obesity;Hypertension;Lipids;Stress   Review  pt with multiple CAD RF demonstrates eagerness to participate in CR program.  pt concerned about his disease progression but primarily discouraged with decreased strength/stamina and weakness. pt looking forward to improved functional capacity.    Expected Outcomes  pt will participate in CR exercise, nutrition and lifestyle modification opporunities to decrease overall RF.            Core Components/Risk Factors/Patient Goals at Discharge (Final Review):  Goals and Risk Factor Review - 06/30/18 0921    Core Components/Risk Factors/Patient Goals Review          Personal Goals Review  Weight Management/Obesity;Hypertension;Lipids;Stress    Review  pt with multiple CAD RF demonstrates eagerness to participate in CR program.  pt concerned about his disease progression but primarily discouraged with decreased strength/stamina and weakness. pt looking forward to improved functional capacity.     Expected Outcomes  pt will participate in CR exercise, nutrition and lifestyle modification opporunities to decrease overall RF.             ITP Comments: ITP Comments    Row Name 06/24/18 [id number] 06/30/18 0918   ITP Comments  Dr. Fransico Him, Medical Director  pt started group exercise. pt tolerated light activity without difficulty. pt oriented to exercise equipment and safety routine.  understanding verbalized      Comments:

## 2018-06-30 NOTE — Progress Notes (Signed)
Cardiac Individual Treatment Plan  Patient Details  Name: Jerry Montoya MRN: [id number] Date of Birth: 1955/09/09 Referring Provider:   Flowsheet Row CARDIAC REHAB PHASE II ORIENTATION from 06/24/2018 in [place]  Referring Provider  Dr. Aundra Dubin      Initial Encounter Date:  Flowsheet Row CARDIAC REHAB PHASE II ORIENTATION from 06/24/2018 in [place]  Date  06/24/18      Visit Diagnosis: Status post coronary artery stent placement  Patient's Home Medications on Admission:  Current Outpatient Medications:  .  acetaminophen (TYLENOL) 500 MG tablet, Take 500 mg by mouth every 8 (eight) hours as needed for mild pain. , Disp: , Rfl:  .  aspirin EC 81 MG tablet, Take 1 tablet (81 mg total) by mouth daily., Disp: 90 tablet, Rfl: 3 .  bisoprolol (ZEBETA) 5 MG tablet, Take 1 tablet (5 mg total) by mouth daily., Disp: 90 tablet, Rfl: 3 .  clopidogrel (PLAVIX) 75 MG tablet, Take 1 tablet (75 mg total) by mouth daily., Disp: 90 tablet, Rfl: 3 .  EPIPEN 2-PAK 0.3 MG/0.3ML SOAJ injection, Inject 0.3 mg into the skin once. , Disp: , Rfl: 0 .  ezetimibe (ZETIA) 10 MG tablet, Take 1 tablet (10 mg total) by mouth daily., Disp: 90 tablet, Rfl: 3 .  fluticasone (FLOVENT HFA) 110 MCG/ACT inhaler, Inhale 1 puff into the lungs 2 (two) times daily. (Patient taking differently: Inhale 1 puff into the lungs 2 (two) times daily as needed (shortness of breath). ), Disp: 1 Inhaler, Rfl: 12 .  levalbuterol (XOPENEX HFA) 45 MCG/ACT inhaler, Inhale 1-2 puffs into the lungs every 6 (six) hours as needed for wheezing., Disp: 1 Inhaler, Rfl: 12 .  lisinopril (PRINIVIL,ZESTRIL) 5 MG tablet, Take 0.5 tablets (2.5 mg total) by mouth daily., Disp: 45 tablet, Rfl: 3 .  nitroGLYCERIN (NITROSTAT) 0.4 MG SL tablet, Place 1 tablet (0.4 mg total) under the tongue every 5 (five) minutes x 3 doses as needed for chest pain., Disp: 25 tablet, Rfl: 5 .  pantoprazole  (PROTONIX) 40 MG tablet, Take 1 tablet (40 mg total) by mouth daily., Disp: 90 tablet, Rfl: 3 .  rosuvastatin (CRESTOR) 40 MG tablet, Take 1 tablet (40 mg total) by mouth daily., Disp: 90 tablet, Rfl: 3  Past Medical History: Past Medical History:  Diagnosis Date  . Asthma   . Cancer ([place]) 2015   colon cancer  . Esophageal reflux   . Hypercholesterolemia   . Hypertension   . Myocardial infarction ([place]) 2015    Tobacco Use: Social History   Tobacco Use  Smoking Status Never Smoker  Smokeless Tobacco Never Used    Labs: Recent Review Flowsheet Data    Labs for ITP Cardiac and Pulmonary Rehab Latest Ref Rng & Units 05/02/2015 11/26/2015 02/12/2017 01/01/2018 05/18/2018   Cholestrol 0 - 200 mg/dL 182 123(L) 139 128 139   LDLCALC 0 - 99 mg/dL 125 77 86 82 80   HDL >40 mg/dL 30(L) 31(L) 36(L) 34(L) 35(L)   Trlycerides <150 mg/dL 137 75 83 62 119   Hemoglobin A1c 4.8 - 5.6 % - - - 5.4 -      Capillary Blood Glucose: Lab Results  Component Value Date   GLUCAP 104 (H) 03/24/2014     Exercise Target Goals: Exercise Program Goal: Individual exercise prescription set using results from initial 6 min walk test and THRR while considering  patient's activity barriers and safety.   Exercise Prescription Goal: Initial  exercise prescription builds to 30-45 minutes a day of aerobic activity, 2-3 days per week.  Home exercise guidelines will be given to patient during program as part of exercise prescription that the participant will acknowledge.  Activity Barriers & Risk Stratification: Activity Barriers & Cardiac Risk Stratification - 06/24/18 1020    Activity Barriers & Cardiac Risk Stratification          Activity Barriers  None    Cardiac Risk Stratification  Moderate           6 Minute Walk: 6 Minute Walk    6 Minute Walk    Row Name 06/24/18 1019   Phase  Initial   Distance  2032 feet   Walk Time  6 minutes   # of Rest Breaks  0   MPH  3.85   METS  4.69   RPE  12    VO2 Peak  16.41   Symptoms  No   Resting HR  68 bpm   Resting BP  108/58   Resting Oxygen Saturation   96 %   Exercise Oxygen Saturation  during 6 min walk  96 %   Max Ex. HR  92 bpm   Max Ex. BP  122/70   2 Minute Post BP  118/62          Oxygen Initial Assessment:   Oxygen Re-Evaluation:   Oxygen Discharge (Final Oxygen Re-Evaluation):   Initial Exercise Prescription: Initial Exercise Prescription - 06/24/18 1000    Date of Initial Exercise RX and Referring Provider          Date  06/24/18    Referring Provider  Dr. Aundra Dubin    Expected Discharge Date  09/29/18        Treadmill          MPH  3.4    Grade  0    Minutes  10        Recumbant Bike          Level  2    Watts  60    Minutes  10    METs  4.52        NuStep          Level  3    SPM  85    Minutes  10    METs  4.4        Prescription Details          Frequency (times per week)  3    Duration  Progress to 30 minutes of continuous aerobic without signs/symptoms of physical distress        Intensity          THRR 40-80% of Max Heartrate  62-125    Ratings of Perceived Exertion  11-13        Progression          Progression  Continue to progress workloads to maintain intensity without signs/symptoms of physical distress.        Resistance Training          Training Prescription  Yes    Weight  3 lbs.     Reps  10-15           Perform Capillary Blood Glucose checks as needed.  Exercise Prescription Changes: Exercise Prescription Changes    Response to Exercise    Row Name 06/30/18 0900   Blood Pressure (Admit)  112/66   Blood Pressure (Exercise)  124/72   Blood Pressure (Exit)  110/70   Heart Rate (Admit)  81 bpm   Heart Rate (Exercise)  107 bpm   Heart Rate (Exit)  78 bpm   Rating of Perceived Exertion (Exercise)  12   Comments  Pt first day of exercise   Duration  Progress to 30 minutes of  aerobic without signs/symptoms of physical distress   Intensity  THRR  unchanged       Progression    Row Name 06/30/18 0900   Progression  Continue to progress workloads to maintain intensity without signs/symptoms of physical distress.   Average METs  3.7       Resistance Training    Row Name 06/30/18 0900   Training Prescription  No       Treadmill    Row Name 06/30/18 0900   MPH  3.4   Grade  0   Minutes  10       Recumbant Bike    Row Name 06/30/18 0900   Level  2   Watts  60   Minutes  10   METs  2.8       NuStep    Row Name 06/30/18 0900   Level  3   SPM  85   Minutes  10   METs  4.8          Exercise Comments: Exercise Comments    Row Name 06/30/18 0935   Exercise Comments  Pt first day of exercise. Tolerated exercise prescription well.       Exercise Goals and Review: Exercise Goals    Exercise Goals    Row Name 06/24/18 1022   Increase Physical Activity  Yes   Intervention  Provide advice, education, support and counseling about physical activity/exercise needs.;Develop an individualized exercise prescription for aerobic and resistive training based on initial evaluation findings, risk stratification, comorbidities and participant's personal goals.   Expected Outcomes  Short Term: Attend rehab on a regular basis to increase amount of physical activity.   Increase Strength and Stamina  Yes   Intervention  Provide advice, education, support and counseling about physical activity/exercise needs.;Develop an individualized exercise prescription for aerobic and resistive training based on initial evaluation findings, risk stratification, comorbidities and participant's personal goals.   Expected Outcomes  Short Term: Increase workloads from initial exercise prescription for resistance, speed, and METs.   Able to understand and use rate of perceived exertion (RPE) scale  Yes   Intervention  Provide education and explanation on how to use RPE scale   Expected Outcomes  Short Term: Able to use RPE daily in rehab to express  subjective intensity level;Long Term:  Able to use RPE to guide intensity level when exercising independently   Knowledge and understanding of Target Heart Rate Range (THRR)  Yes   Intervention  Provide education and explanation of THRR including how the numbers were predicted and where they are located for reference   Expected Outcomes  Short Term: Able to state/look up THRR;Long Term: Able to use THRR to govern intensity when exercising independently;Short Term: Able to use daily as guideline for intensity in rehab   Able to check pulse independently  Yes   Intervention  Provide education and demonstration on how to check pulse in carotid and radial arteries.;Review the importance of being able to check your own pulse for safety during independent exercise   Expected Outcomes  Short Term: Able to explain why pulse checking is important during independent exercise;Long Term: Able to check pulse independently and  accurately   Understanding of Exercise Prescription  Yes   Intervention  Provide education, explanation, and written materials on patient's individual exercise prescription   Expected Outcomes  Short Term: Able to explain program exercise prescription;Long Term: Able to explain home exercise prescription to exercise independently          Exercise Goals Re-Evaluation : Exercise Goals Re-Evaluation    Exercise Goal Re-Evaluation    Row Name 06/30/18 0934   Exercise Goals Review  Increase Physical Activity;Increase Strength and Stamina;Able to understand and use rate of perceived exertion (RPE) scale;Knowledge and understanding of Target Heart Rate Range (THRR);Understanding of Exercise Prescription;Able to check pulse independently   Comments  Pt first day of exercise. Tolerated exercise prescription well. Will increase workloads as tolerated.    Expected Outcomes  Will continue to monitor and progress Pt as tolerated.           Discharge Exercise Prescription (Final Exercise  Prescription Changes): Exercise Prescription Changes - 06/30/18 0900    Response to Exercise          Blood Pressure (Admit)  112/66    Blood Pressure (Exercise)  124/72    Blood Pressure (Exit)  110/70    Heart Rate (Admit)  81 bpm    Heart Rate (Exercise)  107 bpm    Heart Rate (Exit)  78 bpm    Rating of Perceived Exertion (Exercise)  12    Comments  Pt first day of exercise    Duration  Progress to 30 minutes of  aerobic without signs/symptoms of physical distress    Intensity  THRR unchanged        Progression          Progression  Continue to progress workloads to maintain intensity without signs/symptoms of physical distress.    Average METs  3.7        Resistance Training          Training Prescription  No        Treadmill          MPH  3.4    Grade  0    Minutes  10        Recumbant Bike          Level  2    Watts  60    Minutes  10    METs  2.8        NuStep          Level  3    SPM  85    Minutes  10    METs  4.8           Nutrition:  Target Goals: Understanding of nutrition guidelines, daily intake of sodium 1500mg , cholesterol 200mg , calories 30% from fat and 7% or less from saturated fats, daily to have 5 or more servings of fruits and vegetables.  Biometrics: Pre Biometrics - 06/24/18 1023    Pre Biometrics          Height  5\' 10"  (1.778 m)    Weight  73.2 kg    Waist Circumference  35 inches    Hip Circumference  38 inches    Waist to Hip Ratio  0.92 %    BMI (Calculated)  23.16    Triceps Skinfold  22 mm    % Body Fat  24.8 %    Grip Strength  42 kg    Flexibility  14 in    Single Leg Stand  18.Burchard  seconds            Nutrition Therapy Plan and Nutrition Goals: Nutrition Therapy & Goals - 06/24/18 1006    Nutrition Therapy          Diet  heart healthy        Personal Nutrition Goals          Nutrition Goal  Pt to identify and limit food sources of saturated fat, trans fat, refined carbohydrates and sodium     Personal Goal #2  Pt to decrease number of meals eaten away from home    Personal Goal #3  Pt to try 1 new recipe a week        Intervention Plan          Intervention  Prescribe, educate and counsel regarding individualized specific dietary modifications aiming towards targeted core components such as weight, hypertension, lipid management, diabetes, heart failure and other comorbidities.    Expected Outcomes  Short Term Goal: Understand basic principles of dietary content, such as calories, fat, sodium, cholesterol and nutrients.;Long Term Goal: Adherence to prescribed nutrition plan.           Nutrition Assessments: Nutrition Assessments - 06/24/18 1006    MEDFICTS Scores          Pre Score  18           Nutrition Goals Re-Evaluation:   Nutrition Goals Re-Evaluation:   Nutrition Goals Discharge (Final Nutrition Goals Re-Evaluation):   Psychosocial: Target Goals: Acknowledge presence or absence of significant depression and/or stress, maximize coping skills, provide positive support system. Participant is able to verbalize types and ability to use techniques and skills needed for reducing stress and depression.  Initial Review & Psychosocial Screening: Initial Psych Review & Screening - 06/24/18 1151    Initial Review          Current issues with  None Identified        Family Dynamics          Good Support System?  Yes   Pt lists his wife as a source of support.        Barriers          Psychosocial barriers to participate in program  There are no identifiable barriers or psychosocial needs.        Screening Interventions          Interventions  Encouraged to exercise           Quality of Life Scores: Quality of Life - 06/24/18 1024    Quality of Life          Select  Quality of Life        Quality of Life Scores          Health/Function Pre  25.6 %    Socioeconomic Pre  24.56 %    Psych/Spiritual Pre  28.79 %    Family Pre  28.8 %     GLOBAL Pre  26.46 %          Scores of 19 and below usually indicate a poorer quality of life in these areas.  A difference of  2-3 points is a clinically meaningful difference.  A difference of 2-3 points in the total score of the Quality of Life Index has been associated with significant improvement in overall quality of life, self-image, physical symptoms, and general health in studies assessing change in quality of life.  PHQ-9: Recent Review Flowsheet Data    Depression  screen [facility] 2/9 06/30/2018 05/22/2014   Decreased Interest 0 0   Down, Depressed, Hopeless 0 0   PHQ - 2 Score 0 0     Interpretation of Total Score  Total Score Depression Severity:  1-4 = Minimal depression, 5-9 = Mild depression, 10-14 = Moderate depression, 15-19 = Moderately severe depression, 20-27 = Severe depression   Psychosocial Evaluation and Intervention: Psychosocial Evaluation - 06/30/18 0919    Psychosocial Evaluation & Interventions          Interventions  Encouraged to exercise with the program and follow exercise prescription;Stress management education;Relaxation education    Comments  pt c/o work related stress and anxiety. pt unhappy with current job. 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 pt also concerned about his wifes current state of health.  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 pt currently walking for exercise.     Expected Outcomes  pt will exhibit improved outlook and coping skills.     Continue Psychosocial Services   Follow up required by staff           Psychosocial Re-Evaluation:   Psychosocial Discharge (Final Psychosocial Re-Evaluation):   Vocational Rehabilitation: Provide vocational rehab assistance to qualifying candidates.   Vocational Rehab Evaluation & Intervention: Vocational Rehab - 06/24/18 1152    Initial Vocational Rehab Evaluation & Intervention          Assessment shows need for Vocational  Rehabilitation  Yes           Education: Education Goals: Education classes will be provided on a weekly basis, covering required topics. Participant will state understanding/return demonstration of topics presented.  Learning Barriers/Preferences: Learning Barriers/Preferences - 06/24/18 1025    Learning Barriers/Preferences          Learning Barriers  Sight;Hearing    Learning Preferences  Verbal Instruction;Audio;Pictoral;Video;Written Material           Education Topics: Count Your Pulse:  -Group instruction provided by verbal instruction, demonstration, patient participation and written materials to support subject.  Instructors address importance of being able to find your pulse and how to count your pulse when at home without a heart monitor.  Patients get hands on experience counting their pulse with staff help and individually.   Heart Attack, Angina, and Risk Factor Modification:  -Group instruction provided by verbal instruction, video, and written materials to support subject.  Instructors address signs and symptoms of angina and heart attacks.    Also discuss risk factors for heart disease and how to make changes to improve heart health risk factors.   Functional Fitness:  -Group instruction provided by verbal instruction, demonstration, patient participation, and written materials to support subject.  Instructors address safety measures for doing things around the house.  Discuss how to get up and down off the floor, how to pick things up properly, how to safely get out of a chair without assistance, and balance training.   Meditation and Mindfulness:  -Group instruction provided by verbal instruction, patient participation, and written materials to support subject.  Instructor addresses importance of mindfulness and meditation practice to help reduce stress and improve awareness.  Instructor also leads participants through a meditation exercise.    Stretching for  Flexibility and Mobility:  -Group instruction provided by verbal instruction, patient participation, and written materials to support subject.  Instructors lead participants through series of stretches that are designed to increase flexibility thus improving mobility.  These stretches are  additional exercise for major muscle groups that are typically performed during regular warm up and cool down.   Hands Only CPR:  -Group verbal, video, and participation provides a basic overview of AHA guidelines for community CPR. Role-play of emergencies allow participants the opportunity to practice calling for help and chest compression technique with discussion of AED use.   Hypertension: -Group verbal and written instruction that provides a basic overview of hypertension including the most recent diagnostic guidelines, risk factor reduction with self-care instructions and medication management.    Nutrition I class: Heart Healthy Eating:  -Group instruction provided by PowerPoint slides, verbal discussion, and written materials to support subject matter. The instructor gives an explanation and review of the Therapeutic Lifestyle Changes diet recommendations, which includes a discussion on lipid goals, dietary fat, sodium, fiber, plant stanol/sterol esters, sugar, and the components of a well-balanced, healthy diet.   Nutrition II class: Lifestyle Skills:  -Group instruction provided by PowerPoint slides, verbal discussion, and written materials to support subject matter. The instructor gives an explanation and review of label reading, grocery shopping for heart health, heart healthy recipe modifications, and ways to make healthier choices when eating out.   Diabetes Question & Answer:  -Group instruction provided by PowerPoint slides, verbal discussion, and written materials to support subject matter. The instructor gives an explanation and review of diabetes co-morbidities, pre- and post-prandial blood  glucose goals, pre-exercise blood glucose goals, signs, symptoms, and treatment of hypoglycemia and hyperglycemia, and foot care basics.   Diabetes Blitz:  -Group instruction provided by PowerPoint slides, verbal discussion, and written materials to support subject matter. The instructor gives an explanation and review of the physiology behind type 1 and type 2 diabetes, diabetes medications and rational behind using different medications, pre- and post-prandial blood glucose recommendations and Hemoglobin A1c goals, diabetes diet, and exercise including blood glucose guidelines for exercising safely.    Portion Distortion:  -Group instruction provided by PowerPoint slides, verbal discussion, written materials, and food models to support subject matter. The instructor gives an explanation of serving size versus portion size, changes in portions sizes over the last 20 years, and what consists of a serving from each food group.   Stress Management:  -Group instruction provided by verbal instruction, video, and written materials to support subject matter.  Instructors review role of stress in heart disease and how to cope with stress positively.     Exercising on Your Own:  -Group instruction provided by verbal instruction, power point, and written materials to support subject.  Instructors discuss benefits of exercise, components of exercise, frequency and intensity of exercise, and end points for exercise.  Also discuss use of nitroglycerin and activating EMS.  Review options of places to exercise outside of rehab.  Review guidelines for sex with heart disease.   Cardiac Drugs I:  -Group instruction provided by verbal instruction and written materials to support subject.  Instructor reviews cardiac drug classes: antiplatelets, anticoagulants, beta blockers, and statins.  Instructor discusses reasons, side effects, and lifestyle considerations for each drug class.   Cardiac Drugs II:  -Group  instruction provided by verbal instruction and written materials to support subject.  Instructor reviews cardiac drug classes: angiotensin converting enzyme inhibitors (ACE-I), angiotensin II receptor blockers (ARBs), nitrates, and calcium channel blockers.  Instructor discusses reasons, side effects, and lifestyle considerations for each drug class.   Anatomy and Physiology of the Circulatory System:  Group verbal and written instruction and models provide basic cardiac anatomy and physiology, with  the coronary electrical and arterial systems. Review of: AMI, Angina, Valve disease, Heart Failure, Peripheral Artery Disease, Cardiac Arrhythmia, Pacemakers, and the ICD.   Other Education:  -Group or individual verbal, written, or video instructions that support the educational goals of the cardiac rehab program.   Holiday Eating Survival Tips:  -Group instruction provided by PowerPoint slides, verbal discussion, and written materials to support subject matter. The instructor gives patients tips, tricks, and techniques to help them not only survive but enjoy the holidays despite the onslaught of food that accompanies the holidays.   Knowledge Questionnaire Score: Knowledge Questionnaire Score - 06/24/18 1026    Knowledge Questionnaire Score          Pre Score  22/24           Core Components/Risk Factors/Patient Goals at Admission: Personal Goals and Risk Factors at Admission - 06/24/18 1026    Core Components/Risk Factors/Patient Goals on Admission           Weight Management  Yes;Weight Maintenance    Intervention  Weight Management: Develop a combined nutrition and exercise program designed to reach desired caloric intake, while maintaining appropriate intake of nutrient and fiber, sodium and fats, and appropriate energy expenditure required for the weight goal.;Weight Management: Provide education and appropriate resources to help participant work on and attain dietary goals.    Admit  Weight  161 lb 6 oz (73.2 kg)    Expected Outcomes  Short Term: Continue to assess and modify interventions until short term weight is achieved;Long Term: Adherence to nutrition and physical activity/exercise program aimed toward attainment of established weight goal;Weight Maintenance: Understanding of the daily nutrition guidelines, which includes 25-35% calories from fat, 7% or less cal from saturated fats, less than 200mg  cholesterol, less than 1.5gm of sodium, & 5 or more servings of fruits and vegetables daily;Understanding recommendations for meals to include 15-35% energy as protein, 25-35% energy from fat, 35-60% energy from carbohydrates, less than 200mg  of dietary cholesterol, 20-35 gm of total fiber daily;Understanding of distribution of calorie intake throughout the day with the consumption of 4-5 meals/snacks    Hypertension  Yes    Intervention  Provide education on lifestyle modifcations including regular physical activity/exercise, weight management, moderate sodium restriction and increased consumption of fresh fruit, vegetables, and low fat dairy, alcohol moderation, and smoking cessation.;Monitor prescription use compliance.    Expected Outcomes  Short Term: Continued assessment and intervention until BP is < 140/107mm HG in hypertensive participants. < 130/65mm HG in hypertensive participants with diabetes, heart failure or chronic kidney disease.;Long Term: Maintenance of blood pressure at goal levels.    Lipids  Yes    Intervention  Provide education and support for participant on nutrition & aerobic/resistive exercise along with prescribed medications to achieve LDL 70mg , HDL >40mg .    Expected Outcomes  Short Term: Participant states understanding of desired cholesterol values and is compliant with medications prescribed. Participant is following exercise prescription and nutrition guidelines.;Long Term: Cholesterol controlled with medications as prescribed, with individualized  exercise RX and with personalized nutrition plan. Value goals: LDL < 70mg , HDL > 40 mg.    Stress  Yes    Intervention  Offer individual and/or small group education and counseling on adjustment to heart disease, stress management and health-related lifestyle change. Teach and support self-help strategies.;Refer participants experiencing significant psychosocial distress to appropriate mental health specialists for further evaluation and treatment. When possible, include family members and significant others in education/counseling sessions.    Expected Outcomes  Short Term: Participant demonstrates changes in health-related behavior, relaxation and other stress management skills, ability to obtain effective social support, and compliance with psychotropic medications if prescribed.;Long Term: Emotional wellbeing is indicated by absence of clinically significant psychosocial distress or social isolation.           Core Components/Risk Factors/Patient Goals Review:  Goals and Risk Factor Review    Core Components/Risk Factors/Patient Goals Review    Row Name 06/30/18 0921   Personal Goals Review  Weight Management/Obesity;Hypertension;Lipids;Stress   Review  pt with multiple CAD RF demonstrates eagerness to participate in CR program.  pt concerned about his disease progression but primarily discouraged with decreased strength/stamina and weakness. pt looking forward to improved functional capacity.    Expected Outcomes  pt will participate in CR exercise, nutrition and lifestyle modification opporunities to decrease overall RF.            Core Components/Risk Factors/Patient Goals at Discharge (Final Review):  Goals and Risk Factor Review - 06/30/18 0921    Core Components/Risk Factors/Patient Goals Review          Personal Goals Review  Weight Management/Obesity;Hypertension;Lipids;Stress    Review  pt with multiple CAD RF demonstrates eagerness to participate in CR program.  pt concerned  about his disease progression but primarily discouraged with decreased strength/stamina and weakness. pt looking forward to improved functional capacity.     Expected Outcomes  pt will participate in CR exercise, nutrition and lifestyle modification opporunities to decrease overall RF.             ITP Comments: ITP Comments    Row Name 06/24/18 [phone] 06/30/18 0918   ITP Comments  Dr. Fransico Him, Medical Director  pt started group exercise. pt tolerated light activity without difficulty. pt oriented to exercise equipment and safety routine.  understanding verbalized      Comments:

## 2018-06-30 NOTE — Progress Notes (Signed)
Daily Session Note  Patient Details  Name: Jerry Montoya MRN: [id number] Date of Birth: 09-02-1955 Referring Provider:   Flowsheet Row CARDIAC REHAB PHASE II ORIENTATION from 06/24/2018 in [facility]  Referring Provider  Dr. Aundra Dubin      Encounter Date: 06/30/2018  Check In: Session Check In - 06/30/18 0856    Check-In          Supervising physician immediately available to respond to emergencies  [facility] immediately available    Physician(s)  Dr. Lonny Prude    Location  [facility]    Staff Present  Barnet Pall, RN, BSN;Brittany Durene Fruits, BS, ACSM CEP, Exercise Physiologist;Tara Karle Starch, RN, Deland Pretty, MS, ACSM CEP, Exercise Physiologist; , RN, BSN    Medication changes reported      No    Fall or balance concerns reported     No    Tobacco Cessation  No Change    Warm-up and Cool-down  Performed as group-led instruction    Resistance Training Performed  No    VAD Patient?  No    PAD/SET Patient?  No        Pain Assessment          Currently in Pain?  No/denies    Multiple Pain Sites  No           Capillary Blood Glucose: No results found for this or any previous visit (from the past 24 hour(s)).  Exercise Prescription Changes - 06/30/18 0900    Response to Exercise          Blood Pressure (Admit)  112/66    Blood Pressure (Exercise)  124/72    Blood Pressure (Exit)  110/70    Heart Rate (Admit)  81 bpm    Heart Rate (Exercise)  107 bpm    Heart Rate (Exit)  78 bpm    Rating of Perceived Exertion (Exercise)  12    Comments  Pt first day of exercise    Duration  Progress to 30 minutes of  aerobic without signs/symptoms of physical distress    Intensity  THRR unchanged        Progression          Progression  Continue to progress workloads to maintain intensity without signs/symptoms of physical distress.    Average METs  3.7        Resistance Training          Training  Prescription  No        Treadmill          MPH  3.4    Grade  0    Minutes  10        Recumbant Bike          Level  2    Watts  60    Minutes  10    METs  2.8        NuStep          Level  3    SPM  85    Minutes  10    METs  4.8           Social History   Tobacco Use  Smoking Status Never Smoker  Smokeless Tobacco Never Used    Goals Met:  Exercise tolerated well  Goals Unmet:  Not Applicable  Comments: Pt started cardiac rehab today.  Pt tolerated light exercise without difficulty. VSS, telemetry-sinus rhythm,  asymptomatic.  Medication  list reconciled. Pt denies barriers to medicaiton compliance.  PSYCHOSOCIAL ASSESSMENT:  PHQ-0.  Pt exhibits work related stress and anxiety from job transition. Pt feels age discrimination in his job pursuit.  Pt interested in vocational rehab counseling.  Pt encouraged to exercise, participate in stress, relaxation opportunities.    Pt oriented to exercise equipment and routine.    Understanding verbalized.    Dr. Fransico Him is Medical Director for Cardiac Rehab at [facility].

## 2018-07-01 DIAGNOSIS — I2510 Atherosclerotic heart disease of native coronary artery without angina pectoris: Secondary | ICD-10-CM | POA: Diagnosis not present

## 2018-07-01 DIAGNOSIS — K4090 Unilateral inguinal hernia, without obstruction or gangrene, not specified as recurrent: Secondary | ICD-10-CM | POA: Diagnosis not present

## 2018-07-02 ENCOUNTER — Ambulatory Visit (HOSPITAL_COMMUNITY): Payer: 59

## 2018-07-02 ENCOUNTER — Telehealth (HOSPITAL_COMMUNITY): Payer: Self-pay

## 2018-07-02 ENCOUNTER — Encounter (HOSPITAL_COMMUNITY)
Admission: RE | Admit: 2018-07-02 | Discharge: 2018-07-02 | Disposition: A | Discharge status: Home / Self Care | Payer: 59 | Source: Ambulatory Visit | Attending: Cardiology | Admitting: Cardiology

## 2018-07-02 DIAGNOSIS — Z955 Presence of coronary angioplasty implant and graft: Secondary | ICD-10-CM

## 2018-07-02 NOTE — Telephone Encounter (Signed)
disablity paperwork mailed to [facility] 07/02/18

## 2018-07-05 ENCOUNTER — Ambulatory Visit (HOSPITAL_COMMUNITY): Payer: 59

## 2018-07-05 ENCOUNTER — Encounter (HOSPITAL_COMMUNITY)
Admission: RE | Admit: 2018-07-05 | Discharge: 2018-07-05 | Disposition: A | Discharge status: Home / Self Care | Payer: 59 | Source: Ambulatory Visit | Attending: Cardiology | Admitting: Cardiology

## 2018-07-05 DIAGNOSIS — Z955 Presence of coronary angioplasty implant and graft: Secondary | ICD-10-CM | POA: Diagnosis not present

## 2018-07-05 NOTE — Progress Notes (Signed)
Jerry Montoya 62 y.o. male DOB: 01/27/1956 MRN: [id number]      Nutrition Note  1. Status post coronary artery stent placement    Past Medical History:  Diagnosis Date  . Asthma   . Cancer ([place]) 2015   colon cancer  . Esophageal reflux   . Hypercholesterolemia   . Hypertension   . Myocardial infarction ([place]) 2015   Meds reviewed.    Current Outpatient Medications (Cardiovascular):  .  bisoprolol (ZEBETA) 5 MG tablet, Take 1 tablet (5 mg total) by mouth daily. [name] Kitchen  EPIPEN 2-PAK 0.3 MG/0.3ML SOAJ injection, Inject 0.3 mg into the skin once.  .  ezetimibe (ZETIA) 10 MG tablet, Take 1 tablet (10 mg total) by mouth daily. [name] Kitchen  lisinopril (PRINIVIL,ZESTRIL) 5 MG tablet, Take 0.5 tablets (2.5 mg total) by mouth daily. .  nitroGLYCERIN (NITROSTAT) 0.4 MG SL tablet, Place 1 tablet (0.4 mg total) under the tongue every 5 (five) minutes x 3 doses as needed for chest pain. .  rosuvastatin (CRESTOR) 40 MG tablet, Take 1 tablet (40 mg total) by mouth daily.  Current Outpatient Medications (Respiratory):  .  fluticasone (FLOVENT HFA) 110 MCG/ACT inhaler, Inhale 1 puff into the lungs 2 (two) times daily. (Patient taking differently: Inhale 1 puff into the lungs 2 (two) times daily as needed (shortness of breath). ) .  levalbuterol (XOPENEX HFA) 45 MCG/ACT inhaler, Inhale 1-2 puffs into the lungs every 6 (six) hours as needed for wheezing.  Current Outpatient Medications (Analgesics):  .  acetaminophen (TYLENOL) 500 MG tablet, Take 500 mg by mouth every 8 (eight) hours as needed for mild pain.  [name] Kitchen  aspirin EC 81 MG tablet, Take 1 tablet (81 mg total) by mouth daily.  Current Outpatient Medications (Hematological):  .  clopidogrel (PLAVIX) 75 MG tablet, Take 1 tablet (75 mg total) by mouth daily.  Current Outpatient Medications (Other):  .  pantoprazole (PROTONIX) 40 MG tablet, Take 1 tablet (40 mg total) by mouth daily.   HT: Ht Readings from Last 1 Encounters:  06/24/18 5\' 10"  (1.778 m)     WT: Wt Readings from Last 5 Encounters:  06/24/18 161 lb 6 oz (73.2 kg)  06/02/18 154 lb (69.9 kg)  05/19/18 160 lb (72.6 kg)  05/18/18 160 lb 3.2 oz (72.7 kg)  01/01/18 164 lb 8 oz (74.6 kg)     There is no height or weight on file to calculate BMI.   Current tobacco use? No  Labs:  Lipid Panel     Component Value Date/Time   CHOL 139 05/18/2018 1621   TRIG 119 05/18/2018 1621   HDL 35 (L) 05/18/2018 1621   CHOLHDL 4.0 05/18/2018 1621   VLDL 24 05/18/2018 1621   LDLCALC 80 05/18/2018 1621    Lab Results  Component Value Date   HGBA1C 5.4 01/01/2018   CBG (last 3)  No results for input(s): GLUCAP in the last 72 hours.  Nutrition Note Spoke with pt. Nutrition plan and goals reviewed with pt. Pt is following heart healthy diet. Pt would like to focus on heart healthy eating and increasing physical activity while in cardiac rehab. Reviewed heart healthy eating tips from orientation with patient today (label reading, how to build a healthy plate, portion sizes, complex vs. Simple carbohydrates). Pt eats out frequently, willing to make changes to start eating at home more regularly. Pt has reviewed heart healthy recipes with wife and they have started to explore 1-2 a week. Reviewed the benefits of meal prepping  and planning, as well as batch cooking. Pt expressed understanding of the information reviewed. Pt aware of nutrition education classes offered and would like to attend nutrition classes.  Nutrition Diagnosis ? Food-and nutrition-related knowledge deficit related to lack of exposure to information as related to diagnosis of: ? CVD   Nutrition Intervention ? Pt's individual nutrition plan and goals reviewed with pt.  Nutrition Goal(s):   Pt to identify and limit food sources of saturated fat, trans fat, refined carbohydrates and sodium  Pt to decrease number of meals eaten away from home  Pt to try 1-2 new recipes a week   Plan:  ? Pt to attend nutrition  classes ? Nutrition I ? Nutrition II ? Portion Distortion ? Will provide client-centered nutrition education as part of interdisciplinary care ? Monitor and evaluate progress toward nutrition goal with team.   Laurina Bustle, MS, RD, LDN 07/05/2018 9:16 AM

## 2018-07-07 ENCOUNTER — Encounter (HOSPITAL_COMMUNITY)
Admission: RE | Admit: 2018-07-07 | Discharge: 2018-07-07 | Disposition: A | Discharge status: Home / Self Care | Payer: 59 | Source: Ambulatory Visit | Attending: Cardiology | Admitting: Cardiology

## 2018-07-07 ENCOUNTER — Ambulatory Visit (HOSPITAL_COMMUNITY): Payer: 59

## 2018-07-07 DIAGNOSIS — Z955 Presence of coronary angioplasty implant and graft: Secondary | ICD-10-CM

## 2018-07-07 NOTE — Progress Notes (Signed)
I have reviewed a Home Exercise Prescription with [name] . Jerry Montoya is currently exercising at home.  The patient was advised to walk 5-7 days a week for 30-45 minutes.  Nycholas and I discussed how to progress their exercise prescription.  The patient stated that their goals were to continue walking at home for exercise.  The patient stated that they understand the exercise prescription.  We reviewed exercise guidelines, target heart rate during exercise, RPE Scale, weather conditions, NTG use, endpoints for exercise, warmup and cool down.  Patient is encouraged to come to me with any questions. I will continue to follow up with the patient to assist them with progression and safety.    07/07/2018 10:31 AM  Deitra Mayo BS, ACSM CEP

## 2018-07-09 ENCOUNTER — Ambulatory Visit (HOSPITAL_COMMUNITY): Payer: 59

## 2018-07-09 ENCOUNTER — Encounter (HOSPITAL_COMMUNITY)
Admission: RE | Admit: 2018-07-09 | Discharge: 2018-07-09 | Disposition: A | Discharge status: Home / Self Care | Payer: 59 | Source: Ambulatory Visit | Attending: Cardiology | Admitting: Cardiology

## 2018-07-09 DIAGNOSIS — Z955 Presence of coronary angioplasty implant and graft: Secondary | ICD-10-CM

## 2018-07-12 ENCOUNTER — Encounter (HOSPITAL_COMMUNITY)
Admission: RE | Admit: 2018-07-12 | Discharge: 2018-07-12 | Disposition: A | Discharge status: Home / Self Care | Payer: 59 | Source: Ambulatory Visit | Attending: Cardiology | Admitting: Cardiology

## 2018-07-12 ENCOUNTER — Ambulatory Visit (HOSPITAL_COMMUNITY): Payer: 59

## 2018-07-12 DIAGNOSIS — Z955 Presence of coronary angioplasty implant and graft: Secondary | ICD-10-CM | POA: Diagnosis not present

## 2018-07-16 ENCOUNTER — Ambulatory Visit (HOSPITAL_COMMUNITY): Payer: 59

## 2018-07-16 ENCOUNTER — Encounter (HOSPITAL_COMMUNITY)
Admission: RE | Admit: 2018-07-16 | Discharge: 2018-07-16 | Disposition: A | Discharge status: Home / Self Care | Payer: 59 | Source: Ambulatory Visit | Attending: Cardiology | Admitting: Cardiology

## 2018-07-16 DIAGNOSIS — Z955 Presence of coronary angioplasty implant and graft: Secondary | ICD-10-CM | POA: Diagnosis not present

## 2018-07-19 ENCOUNTER — Encounter (HOSPITAL_COMMUNITY)
Admission: RE | Admit: 2018-07-19 | Discharge: 2018-07-19 | Disposition: A | Discharge status: Home / Self Care | Payer: 59 | Source: Ambulatory Visit | Attending: Cardiology | Admitting: Cardiology

## 2018-07-19 ENCOUNTER — Encounter (HOSPITAL_COMMUNITY): Payer: 59

## 2018-07-19 ENCOUNTER — Ambulatory Visit (HOSPITAL_COMMUNITY): Payer: 59

## 2018-07-19 ENCOUNTER — Ambulatory Visit (HOSPITAL_COMMUNITY)
Admission: RE | Admit: 2018-07-19 | Discharge: 2018-07-19 | Disposition: A | Discharge status: Home / Self Care | Payer: 59 | Source: Ambulatory Visit | Attending: Cardiology | Admitting: Cardiology

## 2018-07-19 DIAGNOSIS — Z955 Presence of coronary angioplasty implant and graft: Secondary | ICD-10-CM

## 2018-07-19 DIAGNOSIS — I25119 Atherosclerotic heart disease of native coronary artery with unspecified angina pectoris: Secondary | ICD-10-CM

## 2018-07-19 LAB — LIPID PANEL
Cholesterol: 115 mg/dL (ref 0–200)
HDL: 41 mg/dL (ref >40)
LDL Cholesterol: 59 mg/dL (ref 0–99)
TRIGLYCERIDES: 76 mg/dL (ref <150)
Total CHOL/HDL Ratio: 2.8 RATIO
VLDL: 15 mg/dL (ref 0–40)

## 2018-07-20 ENCOUNTER — Ambulatory Visit (HOSPITAL_COMMUNITY): Payer: 59

## 2018-07-23 ENCOUNTER — Ambulatory Visit (HOSPITAL_COMMUNITY): Payer: 59

## 2018-07-23 ENCOUNTER — Encounter (HOSPITAL_COMMUNITY)
Admission: RE | Admit: 2018-07-23 | Discharge: 2018-07-23 | Disposition: A | Discharge status: Home / Self Care | Payer: 59 | Source: Ambulatory Visit | Attending: Cardiology | Admitting: Cardiology

## 2018-07-23 DIAGNOSIS — Z7902 Long term (current) use of antithrombotics/antiplatelets: Secondary | ICD-10-CM | POA: Diagnosis not present

## 2018-07-23 DIAGNOSIS — I252 Old myocardial infarction: Secondary | ICD-10-CM | POA: Insufficient documentation

## 2018-07-23 DIAGNOSIS — Z7982 Long term (current) use of aspirin: Secondary | ICD-10-CM | POA: Diagnosis not present

## 2018-07-23 DIAGNOSIS — I10 Essential (primary) hypertension: Secondary | ICD-10-CM | POA: Insufficient documentation

## 2018-07-23 DIAGNOSIS — Z79899 Other long term (current) drug therapy: Secondary | ICD-10-CM | POA: Insufficient documentation

## 2018-07-23 DIAGNOSIS — E7800 Pure hypercholesterolemia, unspecified: Secondary | ICD-10-CM | POA: Insufficient documentation

## 2018-07-23 DIAGNOSIS — Z955 Presence of coronary angioplasty implant and graft: Secondary | ICD-10-CM | POA: Diagnosis present

## 2018-07-23 DIAGNOSIS — J45909 Unspecified asthma, uncomplicated: Secondary | ICD-10-CM | POA: Insufficient documentation

## 2018-07-26 ENCOUNTER — Ambulatory Visit (HOSPITAL_COMMUNITY): Payer: 59

## 2018-07-26 ENCOUNTER — Encounter (HOSPITAL_COMMUNITY)
Admission: RE | Admit: 2018-07-26 | Discharge: 2018-07-26 | Disposition: A | Discharge status: Home / Self Care | Payer: 59 | Source: Ambulatory Visit | Attending: Cardiology | Admitting: Cardiology

## 2018-07-26 DIAGNOSIS — Z955 Presence of coronary angioplasty implant and graft: Secondary | ICD-10-CM

## 2018-07-28 ENCOUNTER — Ambulatory Visit (HOSPITAL_COMMUNITY): Payer: 59

## 2018-07-28 ENCOUNTER — Encounter (HOSPITAL_COMMUNITY): Payer: Self-pay

## 2018-07-28 ENCOUNTER — Encounter (HOSPITAL_COMMUNITY)
Admission: RE | Admit: 2018-07-28 | Discharge: 2018-07-28 | Disposition: A | Discharge status: Home / Self Care | Payer: 59 | Source: Ambulatory Visit | Attending: Cardiology | Admitting: Cardiology

## 2018-07-28 DIAGNOSIS — Z955 Presence of coronary angioplasty implant and graft: Secondary | ICD-10-CM | POA: Diagnosis not present

## 2018-07-28 NOTE — Progress Notes (Signed)
Cardiac Individual Treatment Plan  Patient Details  Name: Jerry Montoya MRN: [id number] Date of Birth: 1956-06-14 Referring Provider:   Flowsheet Row CARDIAC REHAB PHASE II ORIENTATION from 06/24/2018 in [place]  Referring Provider  Dr. Aundra Dubin      Initial Encounter Date:  Flowsheet Row CARDIAC REHAB PHASE II ORIENTATION from 06/24/2018 in [place]  Date  06/24/18      Visit Diagnosis: Status post coronary artery stent placement  Patient's Home Medications on Admission:  Current Outpatient Medications:  .  acetaminophen (TYLENOL) 500 MG tablet, Take 500 mg by mouth every 8 (eight) hours as needed for mild pain. , Disp: , Rfl:  .  aspirin EC 81 MG tablet, Take 1 tablet (81 mg total) by mouth daily., Disp: 90 tablet, Rfl: 3 .  bisoprolol (ZEBETA) 5 MG tablet, Take 1 tablet (5 mg total) by mouth daily., Disp: 90 tablet, Rfl: 3 .  clopidogrel (PLAVIX) 75 MG tablet, Take 1 tablet (75 mg total) by mouth daily., Disp: 90 tablet, Rfl: 3 .  EPIPEN 2-PAK 0.3 MG/0.3ML SOAJ injection, Inject 0.3 mg into the skin once. , Disp: , Rfl: 0 .  ezetimibe (ZETIA) 10 MG tablet, Take 1 tablet (10 mg total) by mouth daily., Disp: 90 tablet, Rfl: 3 .  fluticasone (FLOVENT HFA) 110 MCG/ACT inhaler, Inhale 1 puff into the lungs 2 (two) times daily. (Patient taking differently: Inhale 1 puff into the lungs 2 (two) times daily as needed (shortness of breath). ), Disp: 1 Inhaler, Rfl: 12 .  levalbuterol (XOPENEX HFA) 45 MCG/ACT inhaler, Inhale 1-2 puffs into the lungs every 6 (six) hours as needed for wheezing., Disp: 1 Inhaler, Rfl: 12 .  lisinopril (PRINIVIL,ZESTRIL) 5 MG tablet, Take 0.5 tablets (2.5 mg total) by mouth daily., Disp: 45 tablet, Rfl: 3 .  nitroGLYCERIN (NITROSTAT) 0.4 MG SL tablet, Place 1 tablet (0.4 mg total) under the tongue every 5 (five) minutes x 3 doses as needed for chest pain., Disp: 25 tablet, Rfl: 5 .  pantoprazole  (PROTONIX) 40 MG tablet, Take 1 tablet (40 mg total) by mouth daily., Disp: 90 tablet, Rfl: 3 .  rosuvastatin (CRESTOR) 40 MG tablet, Take 1 tablet (40 mg total) by mouth daily., Disp: 90 tablet, Rfl: 3  Past Medical History: Past Medical History:  Diagnosis Date  . Asthma   . Cancer ([place]) 2015   colon cancer  . Esophageal reflux   . Hypercholesterolemia   . Hypertension   . Myocardial infarction ([place]) 2015    Tobacco Use: Social History   Tobacco Use  Smoking Status Never Smoker  Smokeless Tobacco Never Used    Labs: Recent Review Flowsheet Data    Labs for ITP Cardiac and Pulmonary Rehab Latest Ref Rng & Units 11/26/2015 02/12/2017 01/01/2018 05/18/2018 07/19/2018   Cholestrol 0 - 200 mg/dL 123(L) 139 128 139 115   LDLCALC 0 - 99 mg/dL 77 86 82 80 59   HDL >40 mg/dL 31(L) 36(L) 34(L) 35(L) 41   Trlycerides <150 mg/dL 75 83 62 119 76   Hemoglobin A1c 4.8 - 5.6 % - - 5.4 - -      Capillary Blood Glucose: Lab Results  Component Value Date   GLUCAP 104 (H) 03/24/2014     Exercise Target Goals: Exercise Program Goal: Individual exercise prescription set using results from initial 6 min walk test and THRR while considering  patient's activity barriers and safety.   Exercise Prescription Goal: Initial  exercise prescription builds to 30-45 minutes a day of aerobic activity, 2-3 days per week.  Home exercise guidelines will be given to patient during program as part of exercise prescription that the participant will acknowledge.  Activity Barriers & Risk Stratification: Activity Barriers & Cardiac Risk Stratification - 06/24/18 1020    Activity Barriers & Cardiac Risk Stratification          Activity Barriers  None    Cardiac Risk Stratification  Moderate           6 Minute Walk: 6 Minute Walk    6 Minute Walk    Row Name 06/24/18 1019   Phase  Initial   Distance  2032 feet   Walk Time  6 minutes   # of Rest Breaks  0   MPH  3.85   METS  4.69   RPE  12   VO2  Peak  16.41   Symptoms  No   Resting HR  68 bpm   Resting BP  108/58   Resting Oxygen Saturation   96 %   Exercise Oxygen Saturation  during 6 min walk  96 %   Max Ex. HR  92 bpm   Max Ex. BP  122/70   2 Minute Post BP  118/62          Oxygen Initial Assessment:   Oxygen Re-Evaluation:   Oxygen Discharge (Final Oxygen Re-Evaluation):   Initial Exercise Prescription: Initial Exercise Prescription - 06/24/18 1000    Date of Initial Exercise RX and Referring Provider          Date  06/24/18    Referring Provider  Dr. Aundra Dubin    Expected Discharge Date  09/29/18        Treadmill          MPH  3.4    Grade  0    Minutes  10        Recumbant Bike          Level  2    Watts  60    Minutes  10    METs  4.52        NuStep          Level  3    SPM  85    Minutes  10    METs  4.4        Prescription Details          Frequency (times per week)  3    Duration  Progress to 30 minutes of continuous aerobic without signs/symptoms of physical distress        Intensity          THRR 40-80% of Max Heartrate  62-125    Ratings of Perceived Exertion  11-13        Progression          Progression  Continue to progress workloads to maintain intensity without signs/symptoms of physical distress.        Resistance Training          Training Prescription  Yes    Weight  3 lbs.     Reps  10-15           Perform Capillary Blood Glucose checks as needed.  Exercise Prescription Changes: Exercise Prescription Changes    Response to Exercise    Row Name 06/30/18 0900 07/07/18 1000 07/20/18 1400   Blood Pressure (Admit)  112/66  112/66  120/66   Blood Pressure (  Exercise)  124/72  124/72  132/76   Blood Pressure (Exit)  110/70  110/70  102/65   Heart Rate (Admit)  81 bpm  81 bpm  72 bpm   Heart Rate (Exercise)  107 bpm  107 bpm  96 bpm   Heart Rate (Exit)  78 bpm  78 bpm  74 bpm   Rating of Perceived Exertion (Exercise)  _0 Comments  Pt first day  of exercise  Pt first day of exercise  no documentation   Duration  Progress to 30 minutes of  aerobic without signs/symptoms of physical distress  Progress to 30 minutes of  aerobic without signs/symptoms of physical distress  Progress to 30 minutes of  aerobic without signs/symptoms of physical distress   Intensity  THRR unchanged  THRR unchanged  THRR unchanged       Progression    Row Name 06/30/18 0900 07/07/18 1000 07/20/18 1400   Progression  Continue to progress workloads to maintain intensity without signs/symptoms of physical distress.  Continue to progress workloads to maintain intensity without signs/symptoms of physical distress.  Continue to progress workloads to maintain intensity without signs/symptoms of physical distress.   Average METs  3.7  3.7  3.1       Resistance Training    Row Name 06/30/18 0900 07/07/18 1000 07/20/18 1400   Training Prescription  No  No  Yes   Weight  no documentation  no documentation  6 lbs.   Reps  no documentation  no documentation  10-15   Time  no documentation  no documentation  10 Minutes       Treadmill    Row Name 06/30/18 0900 07/07/18 1000 07/20/18 1400   MPH  3.4  3.4  3.4   Grade  0  0  0   Minutes  _1 Recumbant Bike    Row Name 06/30/18 0900 07/07/18 1000 07/20/18 1400   Level  _2 Watts  60  60  60   Minutes  _3 METs  2.8  2.8  2.9       NuStep    Row Name 06/30/18 0900 07/07/18 1000 07/20/18 1400   Level  _4 SPM  85  85  85   Minutes  _5 METs  4.8  4.8  4.8       Arm Ergometer    Row Name 06/30/18 0900 07/07/18 1000 07/20/18 1400   Level  no documentation  no documentation  1.5   Watts  no documentation  no documentation  25   Minutes  no documentation  no documentation  10   METs  no documentation  no documentation  2.8       Home Exercise Plan    [place] Name 06/30/18 0900 07/07/18 1000 07/20/18 1400   Plans to continue exercise at  no documentation  Home  (comment)  Home (comment)   Frequency  no documentation  Add 3 additional days to program exercise sessions.  Add 3 additional days to program exercise sessions.   Initial Home Exercises Provided  no documentation  07/07/18  07/07/18          Exercise Comments: Exercise Comments    Row Name 06/30/18 0935 07/07/18 1030 07/20/18 1420   Exercise Comments  Pt first day of exercise. Tolerated exercise prescription well.   Reviewed HEP with Pt. Pt was responsive and understood goals.   Reviewed METs and goals with Pt. Pt is tolerating exercise Rx well.       Exercise Goals and Review: Exercise Goals    Exercise Goals    Row Name 06/24/18 1022   Increase Physical Activity  Yes   Intervention  Provide advice, education, support and counseling about physical activity/exercise needs.;Develop an individualized exercise prescription for aerobic and resistive training based on initial evaluation findings, risk stratification, comorbidities and participant's personal goals.   Expected Outcomes  Short Term: Attend rehab on a regular basis to increase amount of physical activity.   Increase Strength and Stamina  Yes   Intervention  Provide advice, education, support and counseling about physical activity/exercise needs.;Develop an individualized exercise prescription for aerobic and resistive training based on initial evaluation findings, risk stratification, comorbidities and participant's personal goals.   Expected Outcomes  Short Term: Increase workloads from initial exercise prescription for resistance, speed, and METs.   Able to understand and use rate of perceived exertion (RPE) scale  Yes   Intervention  Provide education and explanation on how to use RPE scale   Expected Outcomes  Short Term: Able to use RPE daily in rehab to express subjective intensity level;Long Term:  Able to use RPE to guide intensity level when exercising independently   Knowledge and understanding of Target Heart Rate  Range (THRR)  Yes   Intervention  Provide education and explanation of THRR including how the numbers were predicted and where they are located for reference   Expected Outcomes  Short Term: Able to state/look up THRR;Long Term: Able to use THRR to govern intensity when exercising independently;Short Term: Able to use daily as guideline for intensity in rehab   Able to check pulse independently  Yes   Intervention  Provide education and demonstration on how to check pulse in carotid and radial arteries.;Review the importance of being able to check your own pulse for safety during independent exercise   Expected Outcomes  Short Term: Able to explain why pulse checking is important during independent exercise;Long Term: Able to check pulse independently and accurately   Understanding of Exercise Prescription  Yes   Intervention  Provide education, explanation, and written materials on patient's individual exercise prescription   Expected Outcomes  Short Term: Able to explain program exercise prescription;Long Term: Able to explain home exercise prescription to exercise independently          Exercise Goals Re-Evaluation : Exercise Goals Re-Evaluation    Exercise Goal Re-Evaluation    Row Name 06/30/18 0934 07/07/18 1028 07/20/18 1418   Exercise Goals Review  Increase Physical Activity;Increase Strength and Stamina;Able to understand and use rate of perceived exertion (RPE) scale;Knowledge and understanding of Target Heart Rate Range (THRR);Understanding of Exercise Prescription;Able to check pulse independently  Increase Physical Activity;Able to understand and use rate of perceived exertion (RPE) scale;Knowledge and understanding of Target Heart Rate Range (THRR);Understanding of Exercise Prescription;Able to check pulse independently;Increase Strength and Stamina  Increase Physical Activity;Able to understand and use rate of perceived exertion (RPE) scale;Knowledge and understanding of Target Heart  Rate Range (THRR);Understanding of Exercise Prescription;Able to check pulse independently;Increase Strength and Stamina   Comments  Pt first day of exercise. Tolerated exercise prescription well. Will increase workloads as tolerated.   Reviewed HEP with Pt. Pt was responsive and understood THRR, RPE scale, weather precautions, warm up and cool  down stretches, and NTG use. Pt is walking at home for exercise 2-4 days for 30-45 minutes.   Reviewed METs and goals with Pt. MET level is 3.1 and has not increased since last check. Encourages Pt to increase MET level with each visit. Pt walking at home 2-4 days in addition to cardiac rehab.    Expected Outcomes  Will continue to monitor and progress Pt as tolerated.   Will continue to monitor and progress Pt as tolerated.   Will continue to monitor and progress Pt as tolerated.           Discharge Exercise Prescription (Final Exercise Prescription Changes): Exercise Prescription Changes - 07/20/18 1400    Response to Exercise          Blood Pressure (Admit)  120/66    Blood Pressure (Exercise)  132/76    Blood Pressure (Exit)  102/65    Heart Rate (Admit)  72 bpm    Heart Rate (Exercise)  96 bpm    Heart Rate (Exit)  74 bpm    Rating of Perceived Exertion (Exercise)  12    Duration  Progress to 30 minutes of  aerobic without signs/symptoms of physical distress    Intensity  THRR unchanged        Progression          Progression  Continue to progress workloads to maintain intensity without signs/symptoms of physical distress.    Average METs  3.1        Resistance Training          Training Prescription  Yes    Weight  6 lbs.    Reps  10-15    Time  10 Minutes        Treadmill          MPH  3.4    Grade  0    Minutes  10        Recumbant Bike          Level  2    Watts  60    Minutes  10    METs  2.9        NuStep          Level  3    SPM  85    Minutes  10    METs  4.8        Arm Ergometer          Level  1.5     Watts  25    Minutes  10    METs  2.8        Home Exercise Plan          Plans to continue exercise at  Home (comment)    Frequency  Add 3 additional days to program exercise sessions.    Initial Home Exercises Provided  07/07/18           Nutrition:  Target Goals: Understanding of nutrition guidelines, daily intake of sodium <1535m, cholesterol <2047m calories 30% from fat and 7% or less from saturated fats, daily to have 5 or more servings of fruits and vegetables.  Biometrics: Pre Biometrics - 06/24/18 1023    Pre Biometrics          Height  _0  (1.778 m)    Weight  73.2 kg    Waist Circumference  35 inches    Hip Circumference  38 inches    Waist to Hip Ratio  0.92 %  BMI (Calculated)  23.16    Triceps Skinfold  22 mm    % Body Fat  24.8 %    Grip Strength  42 kg    Flexibility  14 in    Single Leg Stand  18.31 seconds            Nutrition Therapy Plan and Nutrition Goals: Nutrition Therapy & Goals - 06/24/18 1006    Nutrition Therapy          Diet  heart healthy        Personal Nutrition Goals          Nutrition Goal  Pt to identify and limit food sources of saturated fat, trans fat, refined carbohydrates and sodium    Personal Goal #2  Pt to decrease number of meals eaten away from home    Personal Goal #3  Pt to try 1 new recipe a week        Intervention Plan          Intervention  Prescribe, educate and counsel regarding individualized specific dietary modifications aiming towards targeted core components such as weight, hypertension, lipid management, diabetes, heart failure and other comorbidities.    Expected Outcomes  Short Term Goal: Understand basic principles of dietary content, such as calories, fat, sodium, cholesterol and nutrients.;Long Term Goal: Adherence to prescribed nutrition plan.           Nutrition Assessments: Nutrition Assessments - 06/24/18 1006    MEDFICTS Scores          Pre Score  18            Nutrition Goals Re-Evaluation:   Nutrition Goals Re-Evaluation:   Nutrition Goals Discharge (Final Nutrition Goals Re-Evaluation):   Psychosocial: Target Goals: Acknowledge presence or absence of significant depression and/or stress, maximize coping skills, provide positive support system. Participant is able to verbalize types and ability to use techniques and skills needed for reducing stress and depression.  Initial Review & Psychosocial Screening: Initial Psych Review & Screening - 06/24/18 1151    Initial Review          Current issues with  None Identified        Family Dynamics          Good Support System?  Yes   Pt lists his wife as a source of support.        Barriers          Psychosocial barriers to participate in program  There are no identifiable barriers or psychosocial needs.        Screening Interventions          Interventions  Encouraged to exercise           Quality of Life Scores: Quality of Life - 06/24/18 1024    Quality of Life          Select  Quality of Life        Quality of Life Scores          Health/Function Pre  25.6 %    Socioeconomic Pre  24.56 %    Psych/Spiritual Pre  28.79 %    Family Pre  28.8 %    GLOBAL Pre  26.46 %          Scores of 19 and below usually indicate a poorer quality of life in these areas.  A difference of  2-3 points is a clinically meaningful difference.  A difference of 2-3 points in the  total score of the Quality of Life Index has been associated with significant improvement in overall quality of life, self-image, physical symptoms, and general health in studies assessing change in quality of life.  PHQ-9: Recent Review Flowsheet Data    Depression screen [facility] 2/9 06/30/2018 05/22/2014   Decreased Interest 0 0   Down, Depressed, Hopeless 0 0   PHQ - 2 Score 0 0     Interpretation of Total Score  Total Score Depression Severity:  1-4 = Minimal depression, 5-9 = Mild depression, 10-14 =  Moderate depression, 15-19 = Moderately severe depression, 20-27 = Severe depression   Psychosocial Evaluation and Intervention: Psychosocial Evaluation - 06/30/18 0919    Psychosocial Evaluation & Interventions          Interventions  Encouraged to exercise with the program and follow exercise prescription;Stress management education;Relaxation education    Comments  pt c/o work related stress and anxiety. pt unhappy with current job. 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 pt also concerned about his wifes current state of health.  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 pt currently walking for exercise.     Expected Outcomes  pt will exhibit improved outlook and coping skills.     Continue Psychosocial Services   Follow up required by staff           Psychosocial Re-Evaluation: Psychosocial Re-Evaluation    Psychosocial Re-Evaluation    [place] Name 07/23/18 1016   Current issues with  Current Anxiety/Panic;Current Stress Concerns   Comments  pt with work and health related stress/anxiety. pt pleased that his wife's health concerns are improving. pt currently seeking new employment opportunities.    Expected Outcomes  pt will exhibit improved outlook and coping skills with hopeful outlook,   Interventions  Stress management education;Relaxation education;Encouraged to attend Cardiac Rehabilitation for the exercise   Continue Psychosocial Services   Follow up required by staff          Psychosocial Discharge (Final Psychosocial Re-Evaluation): Psychosocial Re-Evaluation - 07/23/18 1016    Psychosocial Re-Evaluation          Current issues with  Current Anxiety/Panic;Current Stress Concerns    Comments  pt with work and health related stress/anxiety. pt pleased that his wife's health concerns are improving. pt currently seeking new employment opportunities.     Expected Outcomes  pt will exhibit improved outlook and coping  skills with hopeful outlook,    Interventions  Stress management education;Relaxation education;Encouraged to attend Cardiac Rehabilitation for the exercise    Continue Psychosocial Services   Follow up required by staff           Vocational Rehabilitation: Provide vocational rehab assistance to qualifying candidates.   Vocational Rehab Evaluation & Intervention: Vocational Rehab - 07/23/18 1018    Initial Vocational Rehab Evaluation & Intervention          Assessment shows need for Vocational Rehabilitation  Yes    Vocational Rehab Packet given to patient  06/30/18           Education: Education Goals: Education classes will be provided on a weekly basis, covering required topics. Participant will state understanding/return demonstration of topics presented.  Learning Barriers/Preferences: Learning Barriers/Preferences - 06/24/18 1025    Learning Barriers/Preferences          Learning Barriers  Sight;Hearing    Learning Preferences  Verbal Instruction;Audio;Pictoral;Video;Written Material  Education Topics: Count Your Pulse:  -Group instruction provided by verbal instruction, demonstration, patient participation and written materials to support subject.  Instructors address importance of being able to find your pulse and how to count your pulse when at home without a heart monitor.  Patients get hands on experience counting their pulse with staff help and individually.   Heart Attack, Angina, and Risk Factor Modification:  -Group instruction provided by verbal instruction, video, and written materials to support subject.  Instructors address signs and symptoms of angina and heart attacks.    Also discuss risk factors for heart disease and how to make changes to improve heart health risk factors.   Functional Fitness:  -Group instruction provided by verbal instruction, demonstration, patient participation, and written materials to support subject.  Instructors  address safety measures for doing things around the house.  Discuss how to get up and down off the floor, how to pick things up properly, how to safely get out of a chair without assistance, and balance training.   Meditation and Mindfulness:  -Group instruction provided by verbal instruction, patient participation, and written materials to support subject.  Instructor addresses importance of mindfulness and meditation practice to help reduce stress and improve awareness.  Instructor also leads participants through a meditation exercise.    Stretching for Flexibility and Mobility:  -Group instruction provided by verbal instruction, patient participation, and written materials to support subject.  Instructors lead participants through series of stretches that are designed to increase flexibility thus improving mobility.  These stretches are additional exercise for major muscle groups that are typically performed during regular warm up and cool down.   Hands Only CPR:  -Group verbal, video, and participation provides a basic overview of AHA guidelines for community CPR. Role-play of emergencies allow participants the opportunity to practice calling for help and chest compression technique with discussion of AED use.   Hypertension: -Group verbal and written instruction that provides a basic overview of hypertension including the most recent diagnostic guidelines, risk factor reduction with self-care instructions and medication management.    Nutrition I class: Heart Healthy Eating:  -Group instruction provided by PowerPoint slides, verbal discussion, and written materials to support subject matter. The instructor gives an explanation and review of the Therapeutic Lifestyle Changes diet recommendations, which includes a discussion on lipid goals, dietary fat, sodium, fiber, plant stanol/sterol esters, sugar, and the components of a well-balanced, healthy diet.   Nutrition II class: Lifestyle  Skills:  -Group instruction provided by PowerPoint slides, verbal discussion, and written materials to support subject matter. The instructor gives an explanation and review of label reading, grocery shopping for heart health, heart healthy recipe modifications, and ways to make healthier choices when eating out.   Diabetes Question & Answer:  -Group instruction provided by PowerPoint slides, verbal discussion, and written materials to support subject matter. The instructor gives an explanation and review of diabetes co-morbidities, pre- and post-prandial blood glucose goals, pre-exercise blood glucose goals, signs, symptoms, and treatment of hypoglycemia and hyperglycemia, and foot care basics.   Diabetes Blitz:  -Group instruction provided by PowerPoint slides, verbal discussion, and written materials to support subject matter. The instructor gives an explanation and review of the physiology behind type 1 and type 2 diabetes, diabetes medications and rational behind using different medications, pre- and post-prandial blood glucose recommendations and Hemoglobin A1c goals, diabetes diet, and exercise including blood glucose guidelines for exercising safely.    Portion Distortion:  -Group instruction provided by PowerPoint slides,  verbal discussion, written materials, and food models to support subject matter. The instructor gives an explanation of serving size versus portion size, changes in portions sizes over the last 20 years, and what consists of a serving from each food group.   Stress Management:  -Group instruction provided by verbal instruction, video, and written materials to support subject matter.  Instructors review role of stress in heart disease and how to cope with stress positively.     Exercising on Your Own:  -Group instruction provided by verbal instruction, power point, and written materials to support subject.  Instructors discuss benefits of exercise, components of  exercise, frequency and intensity of exercise, and end points for exercise.  Also discuss use of nitroglycerin and activating EMS.  Review options of places to exercise outside of rehab.  Review guidelines for sex with heart disease. Flowsheet Row CARDIAC REHAB PHASE II EXERCISE from 07/07/2018 in [place]  Date  07/07/18  Educator  ep  Instruction Review Code  2- Demonstrated Understanding      Cardiac Drugs I:  -Group instruction provided by verbal instruction and written materials to support subject.  Instructor reviews cardiac drug classes: antiplatelets, anticoagulants, beta blockers, and statins.  Instructor discusses reasons, side effects, and lifestyle considerations for each drug class.   Cardiac Drugs II:  -Group instruction provided by verbal instruction and written materials to support subject.  Instructor reviews cardiac drug classes: angiotensin converting enzyme inhibitors (ACE-I), angiotensin II receptor blockers (ARBs), nitrates, and calcium channel blockers.  Instructor discusses reasons, side effects, and lifestyle considerations for each drug class.   Anatomy and Physiology of the Circulatory System:  Group verbal and written instruction and models provide basic cardiac anatomy and physiology, with the coronary electrical and arterial systems. Review of: AMI, Angina, Valve disease, Heart Failure, Peripheral Artery Disease, Cardiac Arrhythmia, Pacemakers, and the ICD.   Other Education:  -Group or individual verbal, written, or video instructions that support the educational goals of the cardiac rehab program.   Holiday Eating Survival Tips:  -Group instruction provided by PowerPoint slides, verbal discussion, and written materials to support subject matter. The instructor gives patients tips, tricks, and techniques to help them not only survive but enjoy the holidays despite the onslaught of food that accompanies the  holidays.   Knowledge Questionnaire Score: Knowledge Questionnaire Score - 06/24/18 1026    Knowledge Questionnaire Score          Pre Score  22/24           Core Components/Risk Factors/Patient Goals at Admission: Personal Goals and Risk Factors at Admission - 06/24/18 1026    Core Components/Risk Factors/Patient Goals on Admission           Weight Management  Yes;Weight Maintenance    Intervention  Weight Management: Develop a combined nutrition and exercise program designed to reach desired caloric intake, while maintaining appropriate intake of nutrient and fiber, sodium and fats, and appropriate energy expenditure required for the weight goal.;Weight Management: Provide education and appropriate resources to help participant work on and attain dietary goals.    Admit Weight  161 lb 6 oz (73.2 kg)    Expected Outcomes  Short Term: Continue to assess and modify interventions until short term weight is achieved;Long Term: Adherence to nutrition and physical activity/exercise program aimed toward attainment of established weight goal;Weight Maintenance: Understanding of the daily nutrition guidelines, which includes 25-35% calories from fat, 7% or less cal from saturated fats, less than  27m cholesterol, less than 1.5gm of sodium, & 5 or more servings of fruits and vegetables daily;Understanding recommendations for meals to include 15-35% energy as protein, 25-35% energy from fat, 35-60% energy from carbohydrates, less than 2044mof dietary cholesterol, 20-35 gm of total fiber daily;Understanding of distribution of calorie intake throughout the day with the consumption of 4-5 meals/snacks    Hypertension  Yes    Intervention  Provide education on lifestyle modifcations including regular physical activity/exercise, weight management, moderate sodium restriction and increased consumption of fresh fruit, vegetables, and low fat dairy, alcohol moderation, and smoking cessation.;Monitor  prescription use compliance.    Expected Outcomes  Short Term: Continued assessment and intervention until BP is < 140/9051mG in hypertensive participants. < 130/67m14m in hypertensive participants with diabetes, heart failure or chronic kidney disease.;Long Term: Maintenance of blood pressure at goal levels.    Lipids  Yes    Intervention  Provide education and support for participant on nutrition & aerobic/resistive exercise along with prescribed medications to achieve LDL <70mg37mL >40mg.64mExpected Outcomes  Short Term: Participant states understanding of desired cholesterol values and is compliant with medications prescribed. Participant is following exercise prescription and nutrition guidelines.;Long Term: Cholesterol controlled with medications as prescribed, with individualized exercise RX and with personalized nutrition plan. Value goals: LDL < 70mg, 64m> 40 mg.    Stress  Yes    Intervention  Offer individual and/or small group education and counseling on adjustment to heart disease, stress management and health-related lifestyle change. Teach and support self-help strategies.;Refer participants experiencing significant psychosocial distress to appropriate mental health specialists for further evaluation and treatment. When possible, include family members and significant others in education/counseling sessions.    Expected Outcomes  Short Term: Participant demonstrates changes in health-related behavior, relaxation and other stress management skills, ability to obtain effective social support, and compliance with psychotropic medications if prescribed.;Long Term: Emotional wellbeing is indicated by absence of clinically significant psychosocial distress or social isolation.           Core Components/Risk Factors/Patient Goals Review:  Goals and Risk Factor Review    Core Components/Risk Factors/Patient Goals Review    Row Name 06/30/18 0921 07/23/18 1015   Personal Goals Review   Weight Management/Obesity;Hypertension;Lipids;Stress  Weight Management/Obesity;Hypertension;Lipids;Stress   Review  pt with multiple CAD RF demonstrates eagerness to participate in CR program.  pt concerned about his disease progression but primarily discouraged with decreased strength/stamina and weakness. pt looking forward to improved functional capacity.   pt with multiple CAD RF demonstrates eagerness to participate in CR program.  pt concerned about his disease progression but primarily discouraged with decreased strength/stamina and weakness. pt looking forward to improved functional capacity. pt HEP is walking dog 30min t42m daily, which equates to approximately 2 miles.     Expected Outcomes  pt will participate in CR exercise, nutrition and lifestyle modification opporunities to decrease overall RF.    pt will participate in CR exercise, nutrition and lifestyle modification opporunities to decrease overall RF.            Core Components/Risk Factors/Patient Goals at Discharge (Final Review):  Goals and Risk Factor Review - 07/23/18 1015    Core Components/Risk Factors/Patient Goals Review          Personal Goals Review  Weight Management/Obesity;Hypertension;Lipids;Stress    Review  pt with multiple CAD RF demonstrates eagerness to participate in CR program.  pt concerned about his disease progression but primarily discouraged with  decreased strength/stamina and weakness. pt looking forward to improved functional capacity. pt HEP is walking dog 8mn twice daily, which equates to approximately 2 miles.      Expected Outcomes  pt will participate in CR exercise, nutrition and lifestyle modification opporunities to decrease overall RF.             ITP Comments: ITP Comments    Row Name 06/24/18 [id number]/11/19 0918 07/28/18 1446   ITP Comments  Dr. TFransico Him Medical Director  pt started group exercise. pt tolerated light activity without difficulty. pt oriented to exercise  equipment and safety routine.  understanding verbalized  30 day ITP review. pt with good attendance and participation.       Comments:

## 2018-07-30 ENCOUNTER — Ambulatory Visit (HOSPITAL_COMMUNITY): Payer: 59

## 2018-07-30 ENCOUNTER — Encounter (HOSPITAL_COMMUNITY)
Admission: RE | Admit: 2018-07-30 | Discharge: 2018-07-30 | Disposition: A | Discharge status: Home / Self Care | Payer: 59 | Source: Ambulatory Visit | Attending: Cardiology | Admitting: Cardiology

## 2018-07-30 DIAGNOSIS — Z955 Presence of coronary angioplasty implant and graft: Secondary | ICD-10-CM

## 2018-08-02 ENCOUNTER — Ambulatory Visit (HOSPITAL_COMMUNITY): Payer: 59

## 2018-08-02 ENCOUNTER — Encounter (HOSPITAL_COMMUNITY)
Admission: RE | Admit: 2018-08-02 | Discharge: 2018-08-02 | Disposition: A | Discharge status: Home / Self Care | Payer: 59 | Source: Ambulatory Visit | Attending: Cardiology | Admitting: Cardiology

## 2018-08-02 DIAGNOSIS — Z955 Presence of coronary angioplasty implant and graft: Secondary | ICD-10-CM

## 2018-08-04 ENCOUNTER — Ambulatory Visit (HOSPITAL_COMMUNITY): Payer: 59

## 2018-08-04 ENCOUNTER — Encounter (HOSPITAL_COMMUNITY)
Admission: RE | Admit: 2018-08-04 | Discharge: 2018-08-04 | Disposition: A | Discharge status: Home / Self Care | Payer: 59 | Source: Ambulatory Visit | Attending: Cardiology | Admitting: Cardiology

## 2018-08-04 DIAGNOSIS — Z955 Presence of coronary angioplasty implant and graft: Secondary | ICD-10-CM

## 2018-08-06 ENCOUNTER — Ambulatory Visit (HOSPITAL_COMMUNITY): Payer: 59

## 2018-08-06 ENCOUNTER — Encounter (HOSPITAL_COMMUNITY)
Admission: RE | Admit: 2018-08-06 | Discharge: 2018-08-06 | Disposition: A | Discharge status: Home / Self Care | Payer: 59 | Source: Ambulatory Visit | Attending: Cardiology | Admitting: Cardiology

## 2018-08-06 DIAGNOSIS — Z955 Presence of coronary angioplasty implant and graft: Secondary | ICD-10-CM | POA: Diagnosis not present

## 2018-08-09 ENCOUNTER — Ambulatory Visit (HOSPITAL_COMMUNITY): Payer: 59

## 2018-08-09 ENCOUNTER — Encounter (HOSPITAL_COMMUNITY)
Admission: RE | Admit: 2018-08-09 | Discharge: 2018-08-09 | Disposition: A | Discharge status: Home / Self Care | Payer: 59 | Source: Ambulatory Visit | Attending: Cardiology | Admitting: Cardiology

## 2018-08-09 DIAGNOSIS — Z955 Presence of coronary angioplasty implant and graft: Secondary | ICD-10-CM | POA: Diagnosis not present

## 2018-08-11 ENCOUNTER — Ambulatory Visit (HOSPITAL_COMMUNITY): Payer: 59

## 2018-08-11 ENCOUNTER — Encounter (HOSPITAL_COMMUNITY)
Admission: RE | Admit: 2018-08-11 | Discharge: 2018-08-11 | Disposition: A | Discharge status: Home / Self Care | Payer: 59 | Source: Ambulatory Visit | Attending: Cardiology | Admitting: Cardiology

## 2018-08-11 DIAGNOSIS — Z955 Presence of coronary angioplasty implant and graft: Secondary | ICD-10-CM | POA: Diagnosis not present

## 2018-08-13 ENCOUNTER — Ambulatory Visit (HOSPITAL_COMMUNITY): Payer: 59

## 2018-08-13 ENCOUNTER — Encounter (HOSPITAL_COMMUNITY)
Admission: RE | Admit: 2018-08-13 | Discharge: 2018-08-13 | Disposition: A | Discharge status: Home / Self Care | Payer: 59 | Source: Ambulatory Visit | Attending: Cardiology | Admitting: Cardiology

## 2018-08-13 DIAGNOSIS — Z955 Presence of coronary angioplasty implant and graft: Secondary | ICD-10-CM | POA: Diagnosis not present

## 2018-08-16 ENCOUNTER — Ambulatory Visit (HOSPITAL_COMMUNITY): Payer: 59

## 2018-08-16 ENCOUNTER — Encounter (HOSPITAL_COMMUNITY)
Admission: RE | Admit: 2018-08-16 | Discharge: 2018-08-16 | Disposition: A | Discharge status: Home / Self Care | Payer: 59 | Source: Ambulatory Visit | Attending: Cardiology | Admitting: Cardiology

## 2018-08-16 DIAGNOSIS — Z955 Presence of coronary angioplasty implant and graft: Secondary | ICD-10-CM | POA: Diagnosis not present

## 2018-08-18 ENCOUNTER — Ambulatory Visit (HOSPITAL_COMMUNITY): Payer: 59

## 2018-08-18 ENCOUNTER — Encounter (HOSPITAL_COMMUNITY)
Admission: RE | Admit: 2018-08-18 | Discharge: 2018-08-18 | Disposition: A | Discharge status: Home / Self Care | Payer: 59 | Source: Ambulatory Visit | Attending: Cardiology | Admitting: Cardiology

## 2018-08-18 DIAGNOSIS — Z955 Presence of coronary angioplasty implant and graft: Secondary | ICD-10-CM | POA: Diagnosis not present

## 2018-08-20 ENCOUNTER — Encounter (HOSPITAL_COMMUNITY)
Admission: RE | Admit: 2018-08-20 | Discharge: 2018-08-20 | Disposition: A | Discharge status: Home / Self Care | Payer: 59 | Source: Ambulatory Visit

## 2018-08-20 ENCOUNTER — Ambulatory Visit (HOSPITAL_COMMUNITY): Payer: 59

## 2018-08-23 ENCOUNTER — Ambulatory Visit (HOSPITAL_COMMUNITY): Payer: 59

## 2018-08-23 ENCOUNTER — Encounter (HOSPITAL_COMMUNITY)
Admission: RE | Admit: 2018-08-23 | Discharge: 2018-08-23 | Disposition: A | Discharge status: Home / Self Care | Payer: 59 | Source: Ambulatory Visit | Attending: Cardiology | Admitting: Cardiology

## 2018-08-23 DIAGNOSIS — Z955 Presence of coronary angioplasty implant and graft: Secondary | ICD-10-CM | POA: Diagnosis not present

## 2018-08-23 DIAGNOSIS — J45909 Unspecified asthma, uncomplicated: Secondary | ICD-10-CM | POA: Diagnosis not present

## 2018-08-23 DIAGNOSIS — I252 Old myocardial infarction: Secondary | ICD-10-CM | POA: Diagnosis not present

## 2018-08-23 DIAGNOSIS — I10 Essential (primary) hypertension: Secondary | ICD-10-CM | POA: Insufficient documentation

## 2018-08-23 DIAGNOSIS — Z7982 Long term (current) use of aspirin: Secondary | ICD-10-CM | POA: Diagnosis not present

## 2018-08-23 DIAGNOSIS — Z7902 Long term (current) use of antithrombotics/antiplatelets: Secondary | ICD-10-CM | POA: Diagnosis not present

## 2018-08-23 DIAGNOSIS — E7800 Pure hypercholesterolemia, unspecified: Secondary | ICD-10-CM | POA: Insufficient documentation

## 2018-08-23 DIAGNOSIS — Z79899 Other long term (current) drug therapy: Secondary | ICD-10-CM | POA: Diagnosis not present

## 2018-08-25 ENCOUNTER — Ambulatory Visit (HOSPITAL_COMMUNITY): Payer: 59

## 2018-08-25 ENCOUNTER — Encounter (HOSPITAL_COMMUNITY)
Admission: RE | Admit: 2018-08-25 | Discharge: 2018-08-25 | Disposition: A | Discharge status: Home / Self Care | Payer: 59 | Source: Ambulatory Visit | Attending: Cardiology | Admitting: Cardiology

## 2018-08-25 DIAGNOSIS — Z955 Presence of coronary angioplasty implant and graft: Secondary | ICD-10-CM | POA: Diagnosis not present

## 2018-08-26 NOTE — Progress Notes (Signed)
Pt requested information on exercises to strengthen upper back. Pt has noticeable kyphosis curvature of spine and forward head tilt. Pt has tight upper back due to this posture. Gave pt a resistance band and showed him a rowing exercise to utilize with band that will strengthen upper back and help with posture. Instructed pt to do 3 sets of 10 reps  daily, holding the concentric phase of the exercise for 3 seconds while maintaining good posture. Will follow up with pt in a few weeks to possibly increase volume.

## 2018-08-26 NOTE — Progress Notes (Signed)
Cardiac Individual Treatment Plan  Patient Details  Name: Jerry Montoya MRN: [id number] Date of Birth: 1956-06-14 Referring Provider:   Flowsheet Row CARDIAC REHAB PHASE II ORIENTATION from 06/24/2018 in [place]  Referring Provider  Dr. Aundra Dubin      Initial Encounter Date:  Flowsheet Row CARDIAC REHAB PHASE II ORIENTATION from 06/24/2018 in [place]  Date  06/24/18      Visit Diagnosis: Status post coronary artery stent placement  Patient's Home Medications on Admission:  Current Outpatient Medications:  .  acetaminophen (TYLENOL) 500 MG tablet, Take 500 mg by mouth every 8 (eight) hours as needed for mild pain. , Disp: , Rfl:  .  aspirin EC 81 MG tablet, Take 1 tablet (81 mg total) by mouth daily., Disp: 90 tablet, Rfl: 3 .  bisoprolol (ZEBETA) 5 MG tablet, Take 1 tablet (5 mg total) by mouth daily., Disp: 90 tablet, Rfl: 3 .  clopidogrel (PLAVIX) 75 MG tablet, Take 1 tablet (75 mg total) by mouth daily., Disp: 90 tablet, Rfl: 3 .  EPIPEN 2-PAK 0.3 MG/0.3ML SOAJ injection, Inject 0.3 mg into the skin once. , Disp: , Rfl: 0 .  ezetimibe (ZETIA) 10 MG tablet, Take 1 tablet (10 mg total) by mouth daily., Disp: 90 tablet, Rfl: 3 .  fluticasone (FLOVENT HFA) 110 MCG/ACT inhaler, Inhale 1 puff into the lungs 2 (two) times daily. (Patient taking differently: Inhale 1 puff into the lungs 2 (two) times daily as needed (shortness of breath). ), Disp: 1 Inhaler, Rfl: 12 .  levalbuterol (XOPENEX HFA) 45 MCG/ACT inhaler, Inhale 1-2 puffs into the lungs every 6 (six) hours as needed for wheezing., Disp: 1 Inhaler, Rfl: 12 .  lisinopril (PRINIVIL,ZESTRIL) 5 MG tablet, Take 0.5 tablets (2.5 mg total) by mouth daily., Disp: 45 tablet, Rfl: 3 .  nitroGLYCERIN (NITROSTAT) 0.4 MG SL tablet, Place 1 tablet (0.4 mg total) under the tongue every 5 (five) minutes x 3 doses as needed for chest pain., Disp: 25 tablet, Rfl: 5 .  pantoprazole  (PROTONIX) 40 MG tablet, Take 1 tablet (40 mg total) by mouth daily., Disp: 90 tablet, Rfl: 3 .  rosuvastatin (CRESTOR) 40 MG tablet, Take 1 tablet (40 mg total) by mouth daily., Disp: 90 tablet, Rfl: 3  Past Medical History: Past Medical History:  Diagnosis Date  . Asthma   . Cancer ([place]) 2015   colon cancer  . Esophageal reflux   . Hypercholesterolemia   . Hypertension   . Myocardial infarction ([place]) 2015    Tobacco Use: Social History   Tobacco Use  Smoking Status Never Smoker  Smokeless Tobacco Never Used    Labs: Recent Review Flowsheet Data    Labs for ITP Cardiac and Pulmonary Rehab Latest Ref Rng & Units 11/26/2015 02/12/2017 01/01/2018 05/18/2018 07/19/2018   Cholestrol 0 - 200 mg/dL 123(L) 139 128 139 115   LDLCALC 0 - 99 mg/dL 77 86 82 80 59   HDL >40 mg/dL 31(L) 36(L) 34(L) 35(L) 41   Trlycerides <150 mg/dL 75 83 62 119 76   Hemoglobin A1c 4.8 - 5.6 % - - 5.4 - -      Capillary Blood Glucose: Lab Results  Component Value Date   GLUCAP 104 (H) 03/24/2014     Exercise Target Goals: Exercise Program Goal: Individual exercise prescription set using results from initial 6 min walk test and THRR while considering  patient's activity barriers and safety.   Exercise Prescription Goal: Initial  exercise prescription builds to 30-45 minutes a day of aerobic activity, 2-3 days per week.  Home exercise guidelines will be given to patient during program as part of exercise prescription that the participant will acknowledge.  Activity Barriers & Risk Stratification: Activity Barriers & Cardiac Risk Stratification - 06/24/18 1020    Activity Barriers & Cardiac Risk Stratification          Activity Barriers  None    Cardiac Risk Stratification  Moderate           6 Minute Walk: 6 Minute Walk    6 Minute Walk    Row Name 06/24/18 1019   Phase  Initial   Distance  2032 feet   Walk Time  6 minutes   # of Rest Breaks  0   MPH  3.85   METS  4.69   RPE  12   VO2  Peak  16.41   Symptoms  No   Resting HR  68 bpm   Resting BP  108/58   Resting Oxygen Saturation   96 %   Exercise Oxygen Saturation  during 6 min walk  96 %   Max Ex. HR  92 bpm   Max Ex. BP  122/70   2 Minute Post BP  118/62          Oxygen Initial Assessment:   Oxygen Re-Evaluation:   Oxygen Discharge (Final Oxygen Re-Evaluation):   Initial Exercise Prescription: Initial Exercise Prescription - 06/24/18 1000    Date of Initial Exercise RX and Referring Provider          Date  06/24/18    Referring Provider  Dr. McLean    Expected Discharge Date  09/29/18        Treadmill          MPH  3.4    Grade  0    Minutes  10        Recumbant Bike          Level  2    Watts  60    Minutes  10    METs  4.52        NuStep          Level  3    SPM  85    Minutes  10    METs  4.4        Prescription Details          Frequency (times per week)  3    Duration  Progress to 30 minutes of continuous aerobic without signs/symptoms of physical distress        Intensity          THRR 40-80% of Max Heartrate  62-125    Ratings of Perceived Exertion  11-13        Progression          Progression  Continue to progress workloads to maintain intensity without signs/symptoms of physical distress.        Resistance Training          Training Prescription  Yes    Weight  3 lbs.     Reps  10-15           Perform Capillary Blood Glucose checks as needed.  Exercise Prescription Changes: Exercise Prescription Changes    Response to Exercise    Row Name 06/30/18 0900 07/07/18 1000 07/20/18 1400 08/23/18 1000   Blood Pressure (Admit)  112/66  112/66  120/66  118/72     Blood Pressure (Exercise)  124/72  124/72  132/76  108/70   Blood Pressure (Exit)  110/70  110/70  102/65  104/68   Heart Rate (Admit)  81 bpm  81 bpm  72 bpm  90 bpm   Heart Rate (Exercise)  107 bpm  107 bpm  96 bpm  109 bpm   Heart Rate (Exit)  78 bpm  78 bpm  74 bpm  83 bpm   Rating of  Perceived Exertion (Exercise)  _0 Comments  Pt first day of exercise  Pt first day of exercise  no documentation  no documentation   Duration  Progress to 30 minutes of  aerobic without signs/symptoms of physical distress  Progress to 30 minutes of  aerobic without signs/symptoms of physical distress  Progress to 30 minutes of  aerobic without signs/symptoms of physical distress  Progress to 30 minutes of  aerobic without signs/symptoms of physical distress   Intensity  THRR unchanged  THRR unchanged  THRR unchanged  THRR unchanged       Progression    Row Name 06/30/18 0900 07/07/18 1000 07/20/18 1400 08/23/18 1000   Progression  Continue to progress workloads to maintain intensity without signs/symptoms of physical distress.  Continue to progress workloads to maintain intensity without signs/symptoms of physical distress.  Continue to progress workloads to maintain intensity without signs/symptoms of physical distress.  Continue to progress workloads to maintain intensity without signs/symptoms of physical distress.   Average METs  3.7  3.7  3.1  4       Resistance Training    Row Name 06/30/18 0900 07/07/18 1000 07/20/18 1400 08/23/18 1000   Training Prescription  No  No  Yes  Yes   Weight  no documentation  no documentation  6 lbs.  6 lbs.   Reps  no documentation  no documentation  10-15  10-15   Time  no documentation  no documentation  10 Minutes  [place] Name 06/30/18 0900 07/07/18 1000 07/20/18 1400 08/23/18 1000   MPH  3.4  3.4  3.4  3.8   Grade  0  0  0  3   Minutes  _1 Recumbant Bike    Row Name 06/30/18 0900 07/07/18 1000 07/20/18 1400 08/23/18 1000   Level  _2 2.5   Watts  60  60  60  60   Minutes  _3 METs  2.8  2.8  2.9  3.4       NuStep    Row Name 06/30/18 0900 07/07/18 1000 07/20/18 1400 08/23/18 1000   Level  _4 no documentation   SPM  85  85  85  no documentation   Minutes   _5 no documentation   METs  4.8  4.8  4.8  no documentation       Arm Ergometer    Row Name 06/30/18 0900 07/07/18 1000 07/20/18 1400 08/23/18 1000   Level  no documentation  no documentation  1.5  2   Watts  no documentation  no documentation  25  25   Minutes  no documentation  no documentation  10  10   METs  no documentation  no  documentation  2.8  3.2       Home Exercise Plan    Row Name 06/30/18 0900 07/07/18 1000 07/20/18 1400 08/23/18 1000   Plans to continue exercise at  no documentation  Home (comment)  Home (comment)  Home (comment)   Frequency  no documentation  Add 3 additional days to program exercise sessions.  Add 3 additional days to program exercise sessions.  Add 3 additional days to program exercise sessions.   Initial Home Exercises Provided  no documentation  07/07/18  07/07/18  07/07/18          Exercise Comments: Exercise Comments    Row Name 06/30/18 0935 07/07/18 1030 07/20/18 1420 08/25/18 0949   Exercise Comments  Pt first day of exercise. Tolerated exercise prescription well.   Reviewed HEP with Pt. Pt was responsive and understood goals.   Reviewed METs and goals with Pt. Pt is tolerating exercise Rx well.   Reviewed METs and goals with Pt. Pt is tolerating exercise Rx well.       Exercise Goals and Review: Exercise Goals    Exercise Goals    Row Name 06/24/18 1022   Increase Physical Activity  Yes   Intervention  Provide advice, education, support and counseling about physical activity/exercise needs.;Develop an individualized exercise prescription for aerobic and resistive training based on initial evaluation findings, risk stratification, comorbidities and participant's personal goals.   Expected Outcomes  Short Term: Attend rehab on a regular basis to increase amount of physical activity.   Increase Strength and Stamina  Yes   Intervention  Provide advice, education, support and counseling about physical activity/exercise needs.;Develop  an individualized exercise prescription for aerobic and resistive training based on initial evaluation findings, risk stratification, comorbidities and participant's personal goals.   Expected Outcomes  Short Term: Increase workloads from initial exercise prescription for resistance, speed, and METs.   Able to understand and use rate of perceived exertion (RPE) scale  Yes   Intervention  Provide education and explanation on how to use RPE scale   Expected Outcomes  Short Term: Able to use RPE daily in rehab to express subjective intensity level;Long Term:  Able to use RPE to guide intensity level when exercising independently   Knowledge and understanding of Target Heart Rate Range (THRR)  Yes   Intervention  Provide education and explanation of THRR including how the numbers were predicted and where they are located for reference   Expected Outcomes  Short Term: Able to state/look up THRR;Long Term: Able to use THRR to govern intensity when exercising independently;Short Term: Able to use daily as guideline for intensity in rehab   Able to check pulse independently  Yes   Intervention  Provide education and demonstration on how to check pulse in carotid and radial arteries.;Review the importance of being able to check your own pulse for safety during independent exercise   Expected Outcomes  Short Term: Able to explain why pulse checking is important during independent exercise;Long Term: Able to check pulse independently and accurately   Understanding of Exercise Prescription  Yes   Intervention  Provide education, explanation, and written materials on patient's individual exercise prescription   Expected Outcomes  Short Term: Able to explain program exercise prescription;Long Term: Able to explain home exercise prescription to exercise independently          Exercise Goals Re-Evaluation : Exercise Goals Re-Evaluation    Exercise Goal Re-Evaluation    Row Name 06/30/18 0934 07/07/18 1028  07/20/18 1418   08/25/18 0947   Exercise Goals Review  Increase Physical Activity;Increase Strength and Stamina;Able to understand and use rate of perceived exertion (RPE) scale;Knowledge and understanding of Target Heart Rate Range (THRR);Understanding of Exercise Prescription;Able to check pulse independently  Increase Physical Activity;Able to understand and use rate of perceived exertion (RPE) scale;Knowledge and understanding of Target Heart Rate Range (THRR);Understanding of Exercise Prescription;Able to check pulse independently;Increase Strength and Stamina  Increase Physical Activity;Able to understand and use rate of perceived exertion (RPE) scale;Knowledge and understanding of Target Heart Rate Range (THRR);Understanding of Exercise Prescription;Able to check pulse independently;Increase Strength and Stamina  Increase Physical Activity;Increase Strength and Stamina;Able to understand and use rate of perceived exertion (RPE) scale;Knowledge and understanding of Target Heart Rate Range (THRR);Understanding of Exercise Prescription;Able to check pulse independently   Comments  Pt first day of exercise. Tolerated exercise prescription well. Will increase workloads as tolerated.   Reviewed HEP with Pt. Pt was responsive and understood THRR, RPE scale, weather precautions, warm up and cool down stretches, and NTG use. Pt is walking at home for exercise 2-4 days for 30-45 minutes.   Reviewed METs and goals with Pt. MET level is 3.1 and has not increased since last check. Encourages Pt to increase MET level with each visit. Pt walking at home 2-4 days in addition to cardiac rehab.   Reviewed METs and goals with Pt. MET level is 4.0 and has increased since last check. Pt continues to increase MET level with each visit. Pt walking at home 7 days a week for 30 minutes in addition to cardiac rehab.    Expected Outcomes  Will continue to monitor and progress Pt as tolerated.   Will continue to monitor and progress Pt  as tolerated.   Will continue to monitor and progress Pt as tolerated.   Will continue to monitor and progress Pt as tolerated.           Discharge Exercise Prescription (Final Exercise Prescription Changes): Exercise Prescription Changes - 08/23/18 1000    Response to Exercise          Blood Pressure (Admit)  118/72    Blood Pressure (Exercise)  108/70    Blood Pressure (Exit)  104/68    Heart Rate (Admit)  90 bpm    Heart Rate (Exercise)  109 bpm    Heart Rate (Exit)  83 bpm    Rating of Perceived Exertion (Exercise)  12    Duration  Progress to 30 minutes of  aerobic without signs/symptoms of physical distress    Intensity  THRR unchanged        Progression          Progression  Continue to progress workloads to maintain intensity without signs/symptoms of physical distress.    Average METs  4        Resistance Training          Training Prescription  Yes    Weight  6 lbs.    Reps  10-15    Time  10 Minutes        Treadmill          MPH  3.8    Grade  3    Minutes  10        Recumbant Bike          Level  2.5    Watts  60    Minutes  10    METs  3.4        Arm  Ergometer          Level  2    Watts  25    Minutes  10    METs  3.2        Home Exercise Plan          Plans to continue exercise at  Home (comment)    Frequency  Add 3 additional days to program exercise sessions.    Initial Home Exercises Provided  07/07/18           Nutrition:  Target Goals: Understanding of nutrition guidelines, daily intake of sodium <1500mg, cholesterol <200mg, calories 30% from fat and 7% or less from saturated fats, daily to have 5 or more servings of fruits and vegetables.  Biometrics: Pre Biometrics - 06/24/18 1023    Pre Biometrics          Height  5' 10" (1.778 m)    Weight  73.2 kg    Waist Circumference  35 inches    Hip Circumference  38 inches    Waist to Hip Ratio  0.92 %    BMI (Calculated)  23.16    Triceps Skinfold  22 mm    % Body Fat   24.8 %    Grip Strength  42 kg    Flexibility  14 in    Single Leg Stand  18.31 seconds            Nutrition Therapy Plan and Nutrition Goals: Nutrition Therapy & Goals - 06/24/18 1006    Nutrition Therapy          Diet  heart healthy        Personal Nutrition Goals          Nutrition Goal  Pt to identify and limit food sources of saturated fat, trans fat, refined carbohydrates and sodium    Personal Goal #2  Pt to decrease number of meals eaten away from home    Personal Goal #3  Pt to try 1 new recipe a week        Intervention Plan          Intervention  Prescribe, educate and counsel regarding individualized specific dietary modifications aiming towards targeted core components such as weight, hypertension, lipid management, diabetes, heart failure and other comorbidities.    Expected Outcomes  Short Term Goal: Understand basic principles of dietary content, such as calories, fat, sodium, cholesterol and nutrients.;Long Term Goal: Adherence to prescribed nutrition plan.           Nutrition Assessments: Nutrition Assessments - 06/24/18 1006    MEDFICTS Scores          Pre Score  18           Nutrition Goals Re-Evaluation:   Nutrition Goals Re-Evaluation:   Nutrition Goals Discharge (Final Nutrition Goals Re-Evaluation):   Psychosocial: Target Goals: Acknowledge presence or absence of significant depression and/or stress, maximize coping skills, provide positive support system. Participant is able to verbalize types and ability to use techniques and skills needed for reducing stress and depression.  Initial Review & Psychosocial Screening: Initial Psych Review & Screening - 06/24/18 1151    Initial Review          Current issues with  None Identified        Family Dynamics          Good Support System?  Yes   Pt lists his wife as a source of support.          Barriers          Psychosocial barriers to participate in program  There are no  identifiable barriers or psychosocial needs.        Screening Interventions          Interventions  Encouraged to exercise           Quality of Life Scores: Quality of Life - 06/24/18 1024    Quality of Life          Select  Quality of Life        Quality of Life Scores          Health/Function Pre  25.6 %    Socioeconomic Pre  24.56 %    Psych/Spiritual Pre  28.79 %    Family Pre  28.8 %    GLOBAL Pre  26.46 %          Scores of 19 and below usually indicate a poorer quality of life in these areas.  A difference of  2-3 points is a clinically meaningful difference.  A difference of 2-3 points in the total score of the Quality of Life Index has been associated with significant improvement in overall quality of life, self-image, physical symptoms, and general health in studies assessing change in quality of life.  PHQ-9: Recent Review Flowsheet Data    Depression screen PHQ 2/9 06/30/2018 05/22/2014   Decreased Interest 0 0   Down, Depressed, Hopeless 0 0   PHQ - 2 Score 0 0     Interpretation of Total Score  Total Score Depression Severity:  1-4 = Minimal depression, 5-9 = Mild depression, 10-14 = Moderate depression, 15-19 = Moderately severe depression, 20-27 = Severe depression   Psychosocial Evaluation and Intervention: Psychosocial Evaluation - 06/30/18 0919    Psychosocial Evaluation & Interventions          Interventions  Encouraged to exercise with the program and follow exercise prescription;Stress management education;Relaxation education    Comments  pt c/o work related stress and anxiety. pt unhappy with current job. 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 pt also concerned about his wifes current state of health.  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 pt currently walking for exercise.     Expected Outcomes  pt will exhibit improved outlook and coping skills.     Continue Psychosocial Services    Follow up required by staff           Psychosocial Re-Evaluation: Psychosocial Re-Evaluation    Psychosocial Re-Evaluation    Row Name 07/23/18 1016 08/20/18 0715   Current issues with  Current Anxiety/Panic;Current Stress Concerns  Current Anxiety/Panic;Current Stress Concerns   Comments  pt with work and health related stress/anxiety. pt pleased that his wife's health concerns are improving. pt currently seeking new employment opportunities.   pt with work and health related stress/anxiety. pt pleased that his wife's health concerns are improving. pt currently seeking new employment opportunities. pt demonstrates increased confidence with health/work concerns.    Expected Outcomes  pt will exhibit improved outlook and coping skills with hopeful outlook,  pt will exhibit improved outlook and coping skills with hopeful outlook,   Interventions  Stress management education;Relaxation education;Encouraged to attend Cardiac Rehabilitation for the exercise  Stress management education;Relaxation education;Encouraged to attend Cardiac Rehabilitation for the exercise   Continue Psychosocial Services   Follow up required by staff  Follow up required by staff            Psychosocial Discharge (Final Psychosocial Re-Evaluation): Psychosocial Re-Evaluation - 08/20/18 0715    Psychosocial Re-Evaluation          Current issues with  Current Anxiety/Panic;Current Stress Concerns    Comments  pt with work and health related stress/anxiety. pt pleased that his wife's health concerns are improving. pt currently seeking new employment opportunities. pt demonstrates increased confidence with health/work concerns.     Expected Outcomes  pt will exhibit improved outlook and coping skills with hopeful outlook,    Interventions  Stress management education;Relaxation education;Encouraged to attend Cardiac Rehabilitation for the exercise    Continue Psychosocial Services   Follow up required by staff            Vocational Rehabilitation: Provide vocational rehab assistance to qualifying candidates.   Vocational Rehab Evaluation & Intervention: Vocational Rehab - 07/23/18 1018    Initial Vocational Rehab Evaluation & Intervention          Assessment shows need for Vocational Rehabilitation  Yes    Vocational Rehab Packet given to patient  06/30/18           Education: Education Goals: Education classes will be provided on a weekly basis, covering required topics. Participant will state understanding/return demonstration of topics presented.  Learning Barriers/Preferences: Learning Barriers/Preferences - 06/24/18 1025    Learning Barriers/Preferences          Learning Barriers  Sight;Hearing    Learning Preferences  Verbal Instruction;Audio;Pictoral;Video;Written Material           Education Topics: Count Your Pulse:  -Group instruction provided by verbal instruction, demonstration, patient participation and written materials to support subject.  Instructors address importance of being able to find your pulse and how to count your pulse when at home without a heart monitor.  Patients get hands on experience counting their pulse with staff help and individually.   Heart Attack, Angina, and Risk Factor Modification:  -Group instruction provided by verbal instruction, video, and written materials to support subject.  Instructors address signs and symptoms of angina and heart attacks.    Also discuss risk factors for heart disease and how to make changes to improve heart health risk factors.   Functional Fitness:  -Group instruction provided by verbal instruction, demonstration, patient participation, and written materials to support subject.  Instructors address safety measures for doing things around the house.  Discuss how to get up and down off the floor, how to pick things up properly, how to safely get out of a chair without assistance, and balance training.   Meditation and  Mindfulness:  -Group instruction provided by verbal instruction, patient participation, and written materials to support subject.  Instructor addresses importance of mindfulness and meditation practice to help reduce stress and improve awareness.  Instructor also leads participants through a meditation exercise.  Flowsheet Row CARDIAC REHAB PHASE II EXERCISE from 08/04/2018 in [facility] C[facility]  Date  08/04/18  Educator  [place]  Instruction Review Code  2- Demonstrated Understanding      Stretching for Flexibility and Mobility:  -Group instruction provided by verbal instruction, patient participation, and written materials to support subject.  Instructors lead participants through series of stretches that are designed to increase flexibility thus improving mobility.  These stretches are additional exercise for major muscle groups that are typically performed during regular warm up and cool down.   Hands Only CPR:  -Group verbal, video, and participation provides a basic overview of AHA guidelines for community CPR. Role-play of   emergencies allow participants the opportunity to practice calling for help and chest compression technique with discussion of AED use.   Hypertension: -Group verbal and written instruction that provides a basic overview of hypertension including the most recent diagnostic guidelines, risk factor reduction with self-care instructions and medication management.    Nutrition I class: Heart Healthy Eating:  -Group instruction provided by PowerPoint slides, verbal discussion, and written materials to support subject matter. The instructor gives an explanation and review of the Therapeutic Lifestyle Changes diet recommendations, which includes a discussion on lipid goals, dietary fat, sodium, fiber, plant stanol/sterol esters, sugar, and the components of a well-balanced, healthy diet.   Nutrition II class: Lifestyle Skills:  -Group instruction  provided by PowerPoint slides, verbal discussion, and written materials to support subject matter. The instructor gives an explanation and review of label reading, grocery shopping for heart health, heart healthy recipe modifications, and ways to make healthier choices when eating out.   Diabetes Question & Answer:  -Group instruction provided by PowerPoint slides, verbal discussion, and written materials to support subject matter. The instructor gives an explanation and review of diabetes co-morbidities, pre- and post-prandial blood glucose goals, pre-exercise blood glucose goals, signs, symptoms, and treatment of hypoglycemia and hyperglycemia, and foot care basics.   Diabetes Blitz:  -Group instruction provided by PowerPoint slides, verbal discussion, and written materials to support subject matter. The instructor gives an explanation and review of the physiology behind type 1 and type 2 diabetes, diabetes medications and rational behind using different medications, pre- and post-prandial blood glucose recommendations and Hemoglobin A1c goals, diabetes diet, and exercise including blood glucose guidelines for exercising safely.    Portion Distortion:  -Group instruction provided by PowerPoint slides, verbal discussion, written materials, and food models to support subject matter. The instructor gives an explanation of serving size versus portion size, changes in portions sizes over the last 20 years, and what consists of a serving from each food group.   Stress Management:  -Group instruction provided by verbal instruction, video, and written materials to support subject matter.  Instructors review role of stress in heart disease and how to cope with stress positively.     Exercising on Your Own:  -Group instruction provided by verbal instruction, power point, and written materials to support subject.  Instructors discuss benefits of exercise, components of exercise, frequency and intensity of  exercise, and end points for exercise.  Also discuss use of nitroglycerin and activating EMS.  Review options of places to exercise outside of rehab.  Review guidelines for sex with heart disease. Flowsheet Row CARDIAC REHAB PHASE II EXERCISE from 08/04/2018 in [place]  Date  07/07/18  Educator  ep  Instruction Review Code  2- Demonstrated Understanding      Cardiac Drugs I:  -Group instruction provided by verbal instruction and written materials to support subject.  Instructor reviews cardiac drug classes: antiplatelets, anticoagulants, beta blockers, and statins.  Instructor discusses reasons, side effects, and lifestyle considerations for each drug class.   Cardiac Drugs II:  -Group instruction provided by verbal instruction and written materials to support subject.  Instructor reviews cardiac drug classes: angiotensin converting enzyme inhibitors (ACE-I), angiotensin II receptor blockers (ARBs), nitrates, and calcium channel blockers.  Instructor discusses reasons, side effects, and lifestyle considerations for each drug class.   Anatomy and Physiology of the Circulatory System:  Group verbal and written instruction and models provide basic cardiac anatomy and physiology, with the coronary electrical and arterial  systems. Review of: AMI, Angina, Valve disease, Heart Failure, Peripheral Artery Disease, Cardiac Arrhythmia, Pacemakers, and the ICD.   Other Education:  -Group or individual verbal, written, or video instructions that support the educational goals of the cardiac rehab program.   Holiday Eating Survival Tips:  -Group instruction provided by PowerPoint slides, verbal discussion, and written materials to support subject matter. The instructor gives patients tips, tricks, and techniques to help them not only survive but enjoy the holidays despite the onslaught of food that accompanies the holidays.   Knowledge Questionnaire Score: Knowledge  Questionnaire Score - 06/24/18 1026    Knowledge Questionnaire Score          Pre Score  22/24           Core Components/Risk Factors/Patient Goals at Admission: Personal Goals and Risk Factors at Admission - 06/24/18 1026    Core Components/Risk Factors/Patient Goals on Admission           Weight Management  Yes;Weight Maintenance    Intervention  Weight Management: Develop a combined nutrition and exercise program designed to reach desired caloric intake, while maintaining appropriate intake of nutrient and fiber, sodium and fats, and appropriate energy expenditure required for the weight goal.;Weight Management: Provide education and appropriate resources to help participant work on and attain dietary goals.    Admit Weight  161 lb 6 oz (73.2 kg)    Expected Outcomes  Short Term: Continue to assess and modify interventions until short term weight is achieved;Long Term: Adherence to nutrition and physical activity/exercise program aimed toward attainment of established weight goal;Weight Maintenance: Understanding of the daily nutrition guidelines, which includes 25-35% calories from fat, 7% or less cal from saturated fats, less than 200mg cholesterol, less than 1.5gm of sodium, & 5 or more servings of fruits and vegetables daily;Understanding recommendations for meals to include 15-35% energy as protein, 25-35% energy from fat, 35-60% energy from carbohydrates, less than 200mg of dietary cholesterol, 20-35 gm of total fiber daily;Understanding of distribution of calorie intake throughout the day with the consumption of 4-5 meals/snacks    Hypertension  Yes    Intervention  Provide education on lifestyle modifcations including regular physical activity/exercise, weight management, moderate sodium restriction and increased consumption of fresh fruit, vegetables, and low fat dairy, alcohol moderation, and smoking cessation.;Monitor prescription use compliance.    Expected Outcomes  Short Term:  Continued assessment and intervention until BP is < 140/90mm HG in hypertensive participants. < 130/80mm HG in hypertensive participants with diabetes, heart failure or chronic kidney disease.;Long Term: Maintenance of blood pressure at goal levels.    Lipids  Yes    Intervention  Provide education and support for participant on nutrition & aerobic/resistive exercise along with prescribed medications to achieve LDL <70mg, HDL >40mg.    Expected Outcomes  Short Term: Participant states understanding of desired cholesterol values and is compliant with medications prescribed. Participant is following exercise prescription and nutrition guidelines.;Long Term: Cholesterol controlled with medications as prescribed, with individualized exercise RX and with personalized nutrition plan. Value goals: LDL < 70mg, HDL > 40 mg.    Stress  Yes    Intervention  Offer individual and/or small group education and counseling on adjustment to heart disease, stress management and health-related lifestyle change. Teach and support self-help strategies.;Refer participants experiencing significant psychosocial distress to appropriate mental health specialists for further evaluation and treatment. When possible, include family members and significant others in education/counseling sessions.    Expected Outcomes  Short Term: Participant demonstrates   changes in health-related behavior, relaxation and other stress management skills, ability to obtain effective social support, and compliance with psychotropic medications if prescribed.;Long Term: Emotional wellbeing is indicated by absence of clinically significant psychosocial distress or social isolation.           Core Components/Risk Factors/Patient Goals Review:  Goals and Risk Factor Review    Core Components/Risk Factors/Patient Goals Review    Row Name 06/30/18 0921 07/23/18 1015 08/20/18 0716   Personal Goals Review  Weight Management/Obesity;Hypertension;Lipids;Stress   Weight Management/Obesity;Hypertension;Lipids;Stress  Weight Management/Obesity;Hypertension;Lipids;Stress   Review  pt with multiple CAD RF demonstrates eagerness to participate in CR program.  pt concerned about his disease progression but primarily discouraged with decreased strength/stamina and weakness. pt looking forward to improved functional capacity.   pt with multiple CAD RF demonstrates eagerness to participate in CR program.  pt concerned about his disease progression but primarily discouraged with decreased strength/stamina and weakness. pt looking forward to improved functional capacity. pt HEP is walking dog 59mn twice daily, which equates to approximately 2 miles.    pt with multiple CAD RF demonstrates eagerness to participate in CR program.  pt concerned about his disease progression but primarily discouraged with decreased strength/stamina and weakness. pt looking forward to improved functional capacity. pt HEP is walking dog 351m twice daily, which equates to approximately 2 miles.  pt notes increased stamina and improved food choices.     Expected Outcomes  pt will participate in CR exercise, nutrition and lifestyle modification opporunities to decrease overall RF.    pt will participate in CR exercise, nutrition and lifestyle modification opporunities to decrease overall RF.    pt will participate in CR exercise, nutrition and lifestyle modification opporunities to decrease overall RF.            Core Components/Risk Factors/Patient Goals at Discharge (Final Review):  Goals and Risk Factor Review - 08/20/18 0716    Core Components/Risk Factors/Patient Goals Review          Personal Goals Review  Weight Management/Obesity;Hypertension;Lipids;Stress    Review  pt with multiple CAD RF demonstrates eagerness to participate in CR program.  pt concerned about his disease progression but primarily discouraged with decreased strength/stamina and weakness. pt looking forward to improved  functional capacity. pt HEP is walking dog 3066mtwice daily, which equates to approximately 2 miles.  pt notes increased stamina and improved food choices.      Expected Outcomes  pt will participate in CR exercise, nutrition and lifestyle modification opporunities to decrease overall RF.             ITP Comments: ITP Comments    Row Name 06/24/18 [phone]/06/08 0918 07/28/18 1446 08/20/18 0714   ITP Comments  Dr. TraFransico Himedical Director  pt started group exercise. pt tolerated light activity without difficulty. pt oriented to exercise equipment and safety routine.  understanding verbalized  30 day ITP review. pt with good attendance and participation.   30 day ITP review. pt with good attendance and participation. pt demonstrates eagerness to participate in CR exercise.       Comments

## 2018-08-27 ENCOUNTER — Ambulatory Visit (HOSPITAL_COMMUNITY): Payer: 59

## 2018-08-27 ENCOUNTER — Encounter (HOSPITAL_COMMUNITY)
Admission: RE | Admit: 2018-08-27 | Discharge: 2018-08-27 | Disposition: A | Discharge status: Home / Self Care | Payer: 59 | Source: Ambulatory Visit | Attending: Cardiology | Admitting: Cardiology

## 2018-08-27 DIAGNOSIS — Z955 Presence of coronary angioplasty implant and graft: Secondary | ICD-10-CM

## 2018-08-30 ENCOUNTER — Encounter (HOSPITAL_COMMUNITY)
Admission: RE | Admit: 2018-08-30 | Discharge: 2018-08-30 | Disposition: A | Discharge status: Home / Self Care | Payer: 59 | Source: Ambulatory Visit | Attending: Cardiology | Admitting: Cardiology

## 2018-08-30 ENCOUNTER — Ambulatory Visit (HOSPITAL_COMMUNITY): Payer: 59

## 2018-08-30 DIAGNOSIS — Z955 Presence of coronary angioplasty implant and graft: Secondary | ICD-10-CM | POA: Diagnosis not present

## 2018-09-01 ENCOUNTER — Ambulatory Visit (HOSPITAL_COMMUNITY): Payer: 59

## 2018-09-01 ENCOUNTER — Encounter (HOSPITAL_COMMUNITY)
Admission: RE | Admit: 2018-09-01 | Discharge: 2018-09-01 | Disposition: A | Discharge status: Home / Self Care | Payer: 59 | Source: Ambulatory Visit | Attending: Cardiology | Admitting: Cardiology

## 2018-09-01 DIAGNOSIS — Z955 Presence of coronary angioplasty implant and graft: Secondary | ICD-10-CM | POA: Diagnosis not present

## 2018-09-03 ENCOUNTER — Ambulatory Visit (HOSPITAL_COMMUNITY): Payer: 59

## 2018-09-03 ENCOUNTER — Encounter (HOSPITAL_COMMUNITY)
Admission: RE | Admit: 2018-09-03 | Discharge: 2018-09-03 | Disposition: A | Discharge status: Home / Self Care | Payer: 59 | Source: Ambulatory Visit | Attending: Cardiology | Admitting: Cardiology

## 2018-09-03 DIAGNOSIS — Z955 Presence of coronary angioplasty implant and graft: Secondary | ICD-10-CM | POA: Diagnosis not present

## 2018-09-06 ENCOUNTER — Ambulatory Visit (HOSPITAL_COMMUNITY): Payer: 59

## 2018-09-06 ENCOUNTER — Encounter (HOSPITAL_COMMUNITY)
Admission: RE | Admit: 2018-09-06 | Discharge: 2018-09-06 | Disposition: A | Discharge status: Home / Self Care | Payer: 59 | Source: Ambulatory Visit | Attending: Cardiology | Admitting: Cardiology

## 2018-09-06 DIAGNOSIS — Z955 Presence of coronary angioplasty implant and graft: Secondary | ICD-10-CM

## 2018-09-07 ENCOUNTER — Ambulatory Visit (HOSPITAL_COMMUNITY)
Admission: RE | Admit: 2018-09-07 | Discharge: 2018-09-07 | Disposition: A | Discharge status: Home / Self Care | Payer: 59 | Source: Ambulatory Visit | Attending: Cardiology | Admitting: Cardiology

## 2018-09-07 ENCOUNTER — Encounter (HOSPITAL_COMMUNITY): Payer: Self-pay | Admitting: Cardiology

## 2018-09-07 VITALS — BP 116/74 | HR 63 | Wt 165.2 lb

## 2018-09-07 DIAGNOSIS — R0683 Snoring: Secondary | ICD-10-CM | POA: Insufficient documentation

## 2018-09-07 DIAGNOSIS — Z9049 Acquired absence of other specified parts of digestive tract: Secondary | ICD-10-CM | POA: Insufficient documentation

## 2018-09-07 DIAGNOSIS — K4090 Unilateral inguinal hernia, without obstruction or gangrene, not specified as recurrent: Secondary | ICD-10-CM | POA: Diagnosis not present

## 2018-09-07 DIAGNOSIS — Z85038 Personal history of other malignant neoplasm of large intestine: Secondary | ICD-10-CM | POA: Insufficient documentation

## 2018-09-07 DIAGNOSIS — Z7982 Long term (current) use of aspirin: Secondary | ICD-10-CM | POA: Insufficient documentation

## 2018-09-07 DIAGNOSIS — I25110 Atherosclerotic heart disease of native coronary artery with unstable angina pectoris: Secondary | ICD-10-CM | POA: Insufficient documentation

## 2018-09-07 DIAGNOSIS — Z8249 Family history of ischemic heart disease and other diseases of the circulatory system: Secondary | ICD-10-CM | POA: Insufficient documentation

## 2018-09-07 DIAGNOSIS — I25119 Atherosclerotic heart disease of native coronary artery with unspecified angina pectoris: Secondary | ICD-10-CM

## 2018-09-07 DIAGNOSIS — J45909 Unspecified asthma, uncomplicated: Secondary | ICD-10-CM | POA: Diagnosis not present

## 2018-09-07 DIAGNOSIS — I255 Ischemic cardiomyopathy: Secondary | ICD-10-CM | POA: Diagnosis not present

## 2018-09-07 DIAGNOSIS — E785 Hyperlipidemia, unspecified: Secondary | ICD-10-CM

## 2018-09-07 DIAGNOSIS — I252 Old myocardial infarction: Secondary | ICD-10-CM | POA: Diagnosis not present

## 2018-09-07 DIAGNOSIS — Z7902 Long term (current) use of antithrombotics/antiplatelets: Secondary | ICD-10-CM | POA: Insufficient documentation

## 2018-09-07 DIAGNOSIS — Z79899 Other long term (current) drug therapy: Secondary | ICD-10-CM | POA: Diagnosis not present

## 2018-09-07 DIAGNOSIS — K219 Gastro-esophageal reflux disease without esophagitis: Secondary | ICD-10-CM | POA: Insufficient documentation

## 2018-09-07 DIAGNOSIS — I10 Essential (primary) hypertension: Secondary | ICD-10-CM | POA: Diagnosis not present

## 2018-09-07 DIAGNOSIS — Z955 Presence of coronary angioplasty implant and graft: Secondary | ICD-10-CM | POA: Insufficient documentation

## 2018-09-07 MED ORDER — LOSARTAN POTASSIUM 25 MG PO TABS: 25.0000 mg | ORAL_TABLET | Freq: Every day | ORAL | 3 refills | Status: DC

## 2018-09-07 NOTE — Patient Instructions (Signed)
STOP taking Lisinopril.  BEGIN taking Losartan 25mg  (1 tab) each day.  Please follow up with lab work in 2 weeks.   Your physician wants you to follow-up in: 6 MONTHS You will receive a reminder letter in the mail two months in advance. If you don't receive a letter, please call our office to schedule the follow-up appointment.  You have been referred to Dr Serita Grammes at [facility] to assess your hernia, this office will call to schedule an appointment with you.

## 2018-09-07 NOTE — Progress Notes (Signed)
Patient ID: Jerry Montoya, male   DOB: [birth year], 63 y.o.   MRN: [id number] PCP: Dr. Felipa Eth Cardiology: Dr. Aundra Dubin  63 y.o. with history of asthma and colon cancer was admitted in 10/15 with anterior STEMI.  He had fresh total occlusion of the mid LAD and chronic total occlusion of the mid LCx with collaterals.  He had Xience DES to LAD.  Echo that admission showed EF 45-50%.  Repeat echo in 1/16 showed EF up to 55-60%.  In 5/17, he had a Cardiolite that was low risk with a fixed apical inferior defect and no ischemia. In 10/19, he had LHC due to angina showing CTO proximal LCx with collaterals, 60% pLAD, 90% D1 stenosis.  He had DES to D1.   Patient returns for followup of CAD.  He has had no chest pain since PCI.  No exertional dyspnea.  He has been doing cardiac rehab, and unfortunately, while exercising, he developed a painful right inguinal hernia.  It is reducible.  He is not taking lisinopril, his wife had side effects with lisinopril and it scares him.   Labs (10/15): K 3.9, creatinine 0.81 => 1.0, LDL 129 Labs (10/16): K 3.9, creatinine 0.89, LDL 125, HDL 30 Labs (5/17): LDL 77, HDl 31, K 4.2, creatinine 0.9 Labs (10/18): LDL 82 Labs (6/19): LDL 82, HDL 34, K 4, creatinine 0.84 Labs (11/19): K 3.9, creatinine 0.77 Labs (12/19): LDL 59, HDL 41  PMH: 1. Asthma: since his 11s.  2. Colon cancer: s/p sigmoid colectomy in 2015.  3. Hyperlipidemia 4. GERD 5. CAD: Anterior STEMI in 10/15, LHC showed total occlusion mid LAD and chronic total occlusion mid LCx with collaterals.  He had Xience DES to the LAD.   - Cardiolite (5/17) with fixed apical inferior defect, no ischemia.  Low risk study.  EF 58%.  - LHC (10/19) done for angina: CTO proximal LCx with collaterals, 60% pLAD, 90% D1 stenosis.  He had DES to D1.  6. Ischemic cardiomyopathy: Echo (10/15) with EF 45-50%, anterior/anteroseptal/apical severe hypokinesis.  Echo (1/16) with EF 55-60%.  - Echo (12/19): EF 55-60%, mild MR.    SH: [employer], married Jerry Montoya), nonsmoker.   FH: Father with CAD.   ROS: All systems reviewed and negative except as per HPI.   Current Outpatient Medications  Medication Sig Dispense Refill  . acetaminophen (TYLENOL) 500 MG tablet Take 500 mg by mouth every 8 (eight) hours as needed for mild pain.     [name] Kitchen aspirin EC 81 MG tablet Take 1 tablet (81 mg total) by mouth daily. 90 tablet 3  . bisoprolol (ZEBETA) 5 MG tablet Take 1 tablet (5 mg total) by mouth daily. 90 tablet 3  . clopidogrel (PLAVIX) 75 MG tablet Take 1 tablet (75 mg total) by mouth daily. 90 tablet 3  . ezetimibe (ZETIA) 10 MG tablet Take 1 tablet (10 mg total) by mouth daily. 90 tablet 3  . levalbuterol (XOPENEX HFA) 45 MCG/ACT inhaler Inhale 1-2 puffs into the lungs every 6 (six) hours as needed for wheezing. 1 Inhaler 12  . pantoprazole (PROTONIX) 40 MG tablet Take 1 tablet (40 mg total) by mouth daily. 90 tablet 3  . rosuvastatin (CRESTOR) 40 MG tablet Take 1 tablet (40 mg total) by mouth daily. 90 tablet 3  . EPIPEN 2-PAK 0.3 MG/0.3ML SOAJ injection Inject 0.3 mg into the skin once.   0  . fluticasone (FLOVENT HFA) 110 MCG/ACT inhaler Inhale 1 puff into the lungs 2 (two) times daily. (  Patient not taking: Reported on 09/07/2018) 1 Inhaler 12  . losartan (COZAAR) 25 MG tablet Take 1 tablet (25 mg total) by mouth daily. 60 tablet 3  . nitroGLYCERIN (NITROSTAT) 0.4 MG SL tablet Place 1 tablet (0.4 mg total) under the tongue every 5 (five) minutes x 3 doses as needed for chest pain. (Patient not taking: Reported on 09/07/2018) 25 tablet 5   No current facility-administered medications for this encounter.     BP 116/74   Pulse 63   Wt 74.9 kg (165 lb 3.2 oz)   SpO2 97%   BMI 23.70 kg/m  General: NAD Neck: No JVD, no thyromegaly or thyroid nodule.  Lungs: Clear to auscultation bilaterally with normal respiratory effort. CV: Nondisplaced PMI.  Heart regular S1/S2, no S3/S4, no murmur.  No peripheral edema.  No  carotid bruit.  Normal pedal pulses.  Abdomen: Soft, nontender, no hepatosplenomegaly, no distention.  Skin: Intact without lesions or rashes.  Neurologic: Alert and oriented x 3.  Psych: Normal affect. Extremities: No clubbing or cyanosis.  HEENT: Normal.   Assessment/Plan: 1. CAD: s/p anterior STEMI 10/15 with DES to mLAD.  Also had CTO mid LCx with collaterals that was not treated percutaneously.  Last Cardiolite in 5/17 was low risk with no ischemia. In 10/19, he had crescendo angina.  Cath with 90% D1 stenosis, had DES to D1.  No further chest pain.  - Continue ASA, Plavix, Crestor.  - He does not want to take lisinopril.  I will start him instead on losartan 25 mg daily.  BMET 2 wks.  - Continue bisoprolol.   2. Ischemic cardiomyopathy: EF 45-50% on initial echo, improved to 55-60% in 1/16.  Echo 12/19 with EF 55-60%.  - Continue beta blocker and will transition from ACEI to ARB.  3. Hyperlipidemia: Good lipids in 12/19.  4. HTN: Controlled.   5. Asthma: He occasionally wheezes.  6. Snoring: Does not want sleep study.  7. Right inguinal hernia: Painful but reducible.  He needs to wait at least 6 months post-PCI to come off Plavix for surgery (5/20).  I am going to refer him to Dr. Donne Hazel with [facility] for evaluation.   Followup 6 months.   Loralie Champagne 09/07/2018

## 2018-09-08 ENCOUNTER — Ambulatory Visit (HOSPITAL_COMMUNITY): Payer: 59

## 2018-09-08 ENCOUNTER — Encounter (HOSPITAL_COMMUNITY)
Admission: RE | Admit: 2018-09-08 | Discharge: 2018-09-08 | Disposition: A | Discharge status: Home / Self Care | Payer: 59 | Source: Ambulatory Visit | Attending: Cardiology | Admitting: Cardiology

## 2018-09-08 DIAGNOSIS — Z955 Presence of coronary angioplasty implant and graft: Secondary | ICD-10-CM | POA: Diagnosis not present

## 2018-09-10 ENCOUNTER — Ambulatory Visit (HOSPITAL_COMMUNITY): Payer: 59

## 2018-09-10 ENCOUNTER — Encounter (HOSPITAL_COMMUNITY): Payer: 59

## 2018-09-13 ENCOUNTER — Ambulatory Visit (HOSPITAL_COMMUNITY): Payer: 59

## 2018-09-13 ENCOUNTER — Encounter (HOSPITAL_COMMUNITY)
Admission: RE | Admit: 2018-09-13 | Discharge: 2018-09-13 | Disposition: A | Discharge status: Home / Self Care | Payer: 59 | Source: Ambulatory Visit | Attending: Cardiology | Admitting: Cardiology

## 2018-09-13 VITALS — Ht 70.0 in | Wt 163.1 lb

## 2018-09-13 DIAGNOSIS — Z955 Presence of coronary angioplasty implant and graft: Secondary | ICD-10-CM | POA: Diagnosis not present

## 2018-09-15 ENCOUNTER — Ambulatory Visit (HOSPITAL_COMMUNITY): Payer: 59

## 2018-09-15 ENCOUNTER — Encounter (HOSPITAL_COMMUNITY): Payer: 59

## 2018-09-17 ENCOUNTER — Encounter (HOSPITAL_COMMUNITY)
Admission: RE | Admit: 2018-09-17 | Discharge: 2018-09-17 | Disposition: A | Discharge status: Home / Self Care | Payer: 59 | Source: Ambulatory Visit | Attending: Cardiology | Admitting: Cardiology

## 2018-09-17 ENCOUNTER — Ambulatory Visit (HOSPITAL_COMMUNITY): Payer: 59

## 2018-09-17 DIAGNOSIS — Z955 Presence of coronary angioplasty implant and graft: Secondary | ICD-10-CM

## 2018-09-20 ENCOUNTER — Ambulatory Visit (HOSPITAL_COMMUNITY): Payer: 59

## 2018-09-20 ENCOUNTER — Encounter (HOSPITAL_COMMUNITY)
Admission: RE | Admit: 2018-09-20 | Discharge: 2018-09-20 | Disposition: A | Discharge status: Home / Self Care | Payer: 59 | Source: Ambulatory Visit | Attending: Cardiology | Admitting: Cardiology

## 2018-09-20 DIAGNOSIS — Z955 Presence of coronary angioplasty implant and graft: Secondary | ICD-10-CM | POA: Diagnosis not present

## 2018-09-20 DIAGNOSIS — I252 Old myocardial infarction: Secondary | ICD-10-CM | POA: Diagnosis not present

## 2018-09-20 DIAGNOSIS — J45909 Unspecified asthma, uncomplicated: Secondary | ICD-10-CM | POA: Insufficient documentation

## 2018-09-20 DIAGNOSIS — E7800 Pure hypercholesterolemia, unspecified: Secondary | ICD-10-CM | POA: Diagnosis not present

## 2018-09-20 DIAGNOSIS — Z7902 Long term (current) use of antithrombotics/antiplatelets: Secondary | ICD-10-CM | POA: Insufficient documentation

## 2018-09-20 DIAGNOSIS — I10 Essential (primary) hypertension: Secondary | ICD-10-CM | POA: Insufficient documentation

## 2018-09-20 DIAGNOSIS — Z79899 Other long term (current) drug therapy: Secondary | ICD-10-CM | POA: Insufficient documentation

## 2018-09-20 DIAGNOSIS — Z7982 Long term (current) use of aspirin: Secondary | ICD-10-CM | POA: Insufficient documentation

## 2018-09-21 ENCOUNTER — Ambulatory Visit (HOSPITAL_COMMUNITY)
Admission: RE | Admit: 2018-09-21 | Discharge: 2018-09-21 | Disposition: A | Discharge status: Home / Self Care | Payer: 59 | Source: Ambulatory Visit | Attending: Cardiology | Admitting: Cardiology

## 2018-09-21 DIAGNOSIS — I255 Ischemic cardiomyopathy: Secondary | ICD-10-CM | POA: Diagnosis not present

## 2018-09-21 LAB — BASIC METABOLIC PANEL
Anion gap: 6 (ref 5–15)
BUN: 15 mg/dL (ref 8–23)
CO2: 26 mmol/L (ref 22–32)
Calcium: 9.2 mg/dL (ref 8.9–10.3)
Chloride: 107 mmol/L (ref 98–111)
Creatinine, Ser: 0.89 mg/dL (ref 0.61–1.24)
GFR calc Af Amer: >60 mL/min (ref >60)
GFR calc non Af Amer: >60 mL/min (ref >60)
GLUCOSE: 111 mg/dL — AB (ref 70–99)
Potassium: 4.2 mmol/L (ref 3.5–5.1)
Sodium: 139 mmol/L (ref 135–145)

## 2018-09-22 ENCOUNTER — Encounter (HOSPITAL_COMMUNITY)
Admission: RE | Admit: 2018-09-22 | Discharge: 2018-09-22 | Disposition: A | Discharge status: Home / Self Care | Payer: 59 | Source: Ambulatory Visit | Attending: Cardiology | Admitting: Cardiology

## 2018-09-22 ENCOUNTER — Ambulatory Visit (HOSPITAL_COMMUNITY): Payer: 59

## 2018-09-22 DIAGNOSIS — J0100 Acute maxillary sinusitis, unspecified: Secondary | ICD-10-CM | POA: Diagnosis not present

## 2018-09-22 DIAGNOSIS — Z955 Presence of coronary angioplasty implant and graft: Secondary | ICD-10-CM

## 2018-09-22 DIAGNOSIS — J4540 Moderate persistent asthma, uncomplicated: Secondary | ICD-10-CM | POA: Diagnosis not present

## 2018-09-22 NOTE — Progress Notes (Signed)
Incomplete Session Note  Patient Details  Name: Jerry Montoya MRN: [id number] Date of Birth: 10/26/1955 Referring Provider:   Flowsheet Row CARDIAC REHAB PHASE II ORIENTATION from 06/24/2018 in [place]  Referring Provider  Dr. Elmarie Shiley did not complete his rehab session.  Pt c/o sinus congestion associated with wheezing, fatigue and dyspnea.  Pt notes coughing up greenish yellow sputum. Pt notes symptoms are progressively worsening over past 1 week.   T-97.7, BP:120/64.  O2 sat-97% RA.  Pt unable to complete 10 minutes exercise without increasing symptoms. Pt instructed to followup with Primary Care today.  Pt left exercise session early to contact PCP office for appointment.  Pt instructed to increase PO fluid intake and  avoid CR until symptoms resolving.  Understanding verbalized.  Andi Hence, RN, BSN Cardiac Pulmonary Rehab

## 2018-09-22 NOTE — Progress Notes (Signed)
Cardiac Individual Treatment Plan  Patient Details  Name: Jerry Montoya MRN: [id number] Date of Birth: 04-02-1956 Referring Provider:   Flowsheet Row CARDIAC REHAB PHASE II ORIENTATION from 06/24/2018 in [place]  Referring Provider  Dr. Aundra Dubin      Initial Encounter Date:  Flowsheet Row CARDIAC REHAB PHASE II ORIENTATION from 06/24/2018 in [place]  Date  06/24/18      Visit Diagnosis: Status post coronary artery stent placement  Patient's Home Medications on Admission:  Current Outpatient Medications:  .  acetaminophen (TYLENOL) 500 MG tablet, Take 500 mg by mouth every 8 (eight) hours as needed for mild pain. , Disp: , Rfl:  .  aspirin EC 81 MG tablet, Take 1 tablet (81 mg total) by mouth daily., Disp: 90 tablet, Rfl: 3 .  bisoprolol (ZEBETA) 5 MG tablet, Take 1 tablet (5 mg total) by mouth daily., Disp: 90 tablet, Rfl: 3 .  clopidogrel (PLAVIX) 75 MG tablet, Take 1 tablet (75 mg total) by mouth daily., Disp: 90 tablet, Rfl: 3 .  EPIPEN 2-PAK 0.3 MG/0.3ML SOAJ injection, Inject 0.3 mg into the skin once. , Disp: , Rfl: 0 .  ezetimibe (ZETIA) 10 MG tablet, Take 1 tablet (10 mg total) by mouth daily., Disp: 90 tablet, Rfl: 3 .  fluticasone (FLOVENT HFA) 110 MCG/ACT inhaler, Inhale 1 puff into the lungs 2 (two) times daily. (Patient not taking: Reported on 09/07/2018), Disp: 1 Inhaler, Rfl: 12 .  levalbuterol (XOPENEX HFA) 45 MCG/ACT inhaler, Inhale 1-2 puffs into the lungs every 6 (six) hours as needed for wheezing., Disp: 1 Inhaler, Rfl: 12 .  losartan (COZAAR) 25 MG tablet, Take 1 tablet (25 mg total) by mouth daily., Disp: 60 tablet, Rfl: 3 .  nitroGLYCERIN (NITROSTAT) 0.4 MG SL tablet, Place 1 tablet (0.4 mg total) under the tongue every 5 (five) minutes x 3 doses as needed for chest pain. (Patient not taking: Reported on 09/07/2018), Disp: 25 tablet, Rfl: 5 .  pantoprazole (PROTONIX) 40 MG tablet, Take 1 tablet (40  mg total) by mouth daily., Disp: 90 tablet, Rfl: 3 .  rosuvastatin (CRESTOR) 40 MG tablet, Take 1 tablet (40 mg total) by mouth daily., Disp: 90 tablet, Rfl: 3  Past Medical History: Past Medical History:  Diagnosis Date  . Asthma   . Cancer ([place]) 2015   colon cancer  . Esophageal reflux   . Hypercholesterolemia   . Hypertension   . Myocardial infarction ([place]) 2015    Tobacco Use: Social History   Tobacco Use  Smoking Status Never Smoker  Smokeless Tobacco Never Used    Labs: Recent Review Flowsheet Data    Labs for ITP Cardiac and Pulmonary Rehab Latest Ref Rng & Units 11/26/2015 02/12/2017 01/01/2018 05/18/2018 07/19/2018   Cholestrol 0 - 200 mg/dL 123(L) 139 128 139 115   LDLCALC 0 - 99 mg/dL 77 86 82 80 59   HDL >40 mg/dL 31(L) 36(L) 34(L) 35(L) 41   Trlycerides <150 mg/dL 75 83 62 119 76   Hemoglobin A1c 4.8 - 5.6 % - - 5.4 - -      Capillary Blood Glucose: Lab Results  Component Value Date   GLUCAP 104 (H) 03/24/2014     Exercise Target Goals: Exercise Program Goal: Individual exercise prescription set using results from initial 6 min walk test and THRR while considering  patient's activity barriers and safety.   Exercise Prescription Goal: Initial exercise prescription builds to 30-45 minutes a  day of aerobic activity, 2-3 days per week.  Home exercise guidelines will be given to patient during program as part of exercise prescription that the participant will acknowledge.  Activity Barriers & Risk Stratification: Activity Barriers & Cardiac Risk Stratification - 06/24/18 1020    Activity Barriers & Cardiac Risk Stratification          Activity Barriers  None    Cardiac Risk Stratification  Moderate           6 Minute Walk: 6 Minute Walk    6 Minute Walk    Row Name 06/24/18 1019 09/13/18 1014   Phase  Initial  Discharge   Distance  2032 feet  2267 feet   Distance % Change  no documentation  11.56 %   Distance Feet Change  no documentation  235 ft    Walk Time  6 minutes  6 minutes   # of Rest Breaks  0  0   MPH  3.85  4.3   METS  4.69  5.3   RPE  12  11   VO2 Peak  16.41  18.63   Symptoms  No  No   Resting HR  68 bpm  64 bpm   Resting BP  108/58  120/74   Resting Oxygen Saturation   96 %  no documentation   Exercise Oxygen Saturation  during 6 min walk  96 %  no documentation   Max Ex. HR  92 bpm  115 bpm   Max Ex. BP  122/70  126/72   2 Minute Post BP  118/62  112/74          Oxygen Initial Assessment:   Oxygen Re-Evaluation:   Oxygen Discharge (Final Oxygen Re-Evaluation):   Initial Exercise Prescription: Initial Exercise Prescription - 06/24/18 1000    Date of Initial Exercise RX and Referring Provider          Date  06/24/18    Referring Provider  Dr. Aundra Dubin    Expected Discharge Date  09/29/18        Treadmill          MPH  3.4    Grade  0    Minutes  10        Recumbant Bike          Level  2    Watts  60    Minutes  10    METs  4.52        NuStep          Level  3    SPM  85    Minutes  10    METs  4.4        Prescription Details          Frequency (times per week)  3    Duration  Progress to 30 minutes of continuous aerobic without signs/symptoms of physical distress        Intensity          THRR 40-80% of Max Heartrate  62-125    Ratings of Perceived Exertion  11-13        Progression          Progression  Continue to progress workloads to maintain intensity without signs/symptoms of physical distress.        Resistance Training          Training Prescription  Yes    Weight  3 lbs.     Reps  10-15  Perform Capillary Blood Glucose checks as needed.  Exercise Prescription Changes: Exercise Prescription Changes    Response to Exercise    Row Name 06/30/18 0900 07/07/18 1000 07/20/18 1400 08/23/18 1000 09/08/18 0900   Blood Pressure (Admit)  112/66  112/66  120/66  118/72  122/70   Blood Pressure (Exercise)  124/72  124/72  132/76  108/70  130/64    Blood Pressure (Exit)  110/70  110/70  102/65  104/68  104/60   Heart Rate (Admit)  81 bpm  81 bpm  72 bpm  90 bpm  67 bpm   Heart Rate (Exercise)  107 bpm  107 bpm  96 bpm  109 bpm  137 bpm   Heart Rate (Exit)  78 bpm  78 bpm  74 bpm  83 bpm  73 bpm   Rating of Perceived Exertion (Exercise)  _0 Comments  Pt first day of exercise  Pt first day of exercise  no documentation  no documentation  no documentation   Duration  Progress to 30 minutes of  aerobic without signs/symptoms of physical distress  Progress to 30 minutes of  aerobic without signs/symptoms of physical distress  Progress to 30 minutes of  aerobic without signs/symptoms of physical distress  Progress to 30 minutes of  aerobic without signs/symptoms of physical distress  Progress to 30 minutes of  aerobic without signs/symptoms of physical distress   Intensity  THRR unchanged  THRR unchanged  THRR unchanged  THRR unchanged  THRR unchanged       Progression    Row Name 06/30/18 0900 07/07/18 1000 07/20/18 1400 08/23/18 1000 09/08/18 0900   Progression  Continue to progress workloads to maintain intensity without signs/symptoms of physical distress.  Continue to progress workloads to maintain intensity without signs/symptoms of physical distress.  Continue to progress workloads to maintain intensity without signs/symptoms of physical distress.  Continue to progress workloads to maintain intensity without signs/symptoms of physical distress.  Continue to progress workloads to maintain intensity without signs/symptoms of physical distress.   Average METs  3.7  3.7  3.1  4  5.9       Resistance Training    Row Name 06/30/18 0900 07/07/18 1000 07/20/18 1400 08/23/18 1000 09/08/18 0900   Training Prescription  No  No  Yes  Yes  No   Weight  no documentation  no documentation  6 lbs.  6 lbs.  no documentation   Reps  no documentation  no documentation  10-15  10-15  no documentation   Time  no documentation  no  documentation  10 Minutes  10 Minutes  no documentation       Treadmill    Row Name 06/30/18 0900 07/07/18 1000 07/20/18 1400 08/23/18 1000 09/08/18 0900   MPH  3.4  3.4  3.4  3.8  3.8   Grade  0  0  0  3  3   Minutes  _1 Recumbant Bike    Row Name 06/30/18 0900 07/07/18 1000 07/20/18 1400 08/23/18 1000 09/08/18 0900   Level  _2 2.5  3   Watts  60  60  60  60  60   Minutes  _3 METs  2.8  2.8  2.9  3.4  4.2  NuStep    Row Name 06/30/18 0900 07/07/18 1000 07/20/18 1400 08/23/18 1000 09/08/18 0900   Level  _0 no documentation  no documentation   SPM  85  85  85  no documentation  no documentation   Minutes  _1 no documentation  no documentation   METs  4.8  4.8  4.8  no documentation  no documentation       Arm Ergometer    Row Name 06/30/18 0900 07/07/18 1000 07/20/18 1400 08/23/18 1000 09/08/18 0900   Level  no documentation  no documentation  1.5  2  no documentation   Watts  no documentation  no documentation  25  25  no documentation   Minutes  no documentation  no documentation  10  10  no documentation   METs  no documentation  no documentation  2.8  3.2  no documentation       Elliptical    Row Name 06/30/18 0900 07/07/18 1000 07/20/18 1400 08/23/18 1000 09/08/18 0900   Level  no documentation  no documentation  no documentation  no documentation  2   Speed  no documentation  no documentation  no documentation  no documentation  2   Minutes  no documentation  no documentation  no documentation  no documentation  10   METs  no documentation  no documentation  no documentation  no documentation  8.1       Home Exercise Plan    [place] Name 06/30/18 0900 07/07/18 1000 07/20/18 1400 08/23/18 1000 09/08/18 0900   Plans to continue exercise at  no documentation  Home (comment)  Home (comment)  Home (comment)  Home (comment)   Frequency  no documentation  Add 3 additional days to program exercise sessions.  Add 3  additional days to program exercise sessions.  Add 3 additional days to program exercise sessions.  Add 3 additional days to program exercise sessions.   Initial Home Exercises Provided  no documentation  07/07/18  07/07/18  07/07/18  07/07/18          Exercise Comments: Exercise Comments    Row Name 06/30/18 0935 07/07/18 1030 07/20/18 1420 08/25/18 0949 09/21/18 1417   Exercise Comments  Pt first day of exercise. Tolerated exercise prescription well.   Reviewed HEP with Pt. Pt was responsive and understood goals.   Reviewed METs and goals with Pt. Pt is tolerating exercise Rx well.   Reviewed METs and goals with Pt. Pt is tolerating exercise Rx well.   Reviewed METs and goals with Pt. Pt is tolerating exercise Rx well.       Exercise Goals and Review: Exercise Goals    Exercise Goals    Row Name 06/24/18 1022   Increase Physical Activity  Yes   Intervention  Provide advice, education, support and counseling about physical activity/exercise needs.;Develop an individualized exercise prescription for aerobic and resistive training based on initial evaluation findings, risk stratification, comorbidities and participant's personal goals.   Expected Outcomes  Short Term: Attend rehab on a regular basis to increase amount of physical activity.   Increase Strength and Stamina  Yes   Intervention  Provide advice, education, support and counseling about physical activity/exercise needs.;Develop an individualized exercise prescription for aerobic and resistive training based on initial evaluation findings, risk stratification, comorbidities and participant's personal goals.   Expected Outcomes  Short Term: Increase workloads from initial exercise prescription for resistance, speed, and  METs.   Able to understand and use rate of perceived exertion (RPE) scale  Yes   Intervention  Provide education and explanation on how to use RPE scale   Expected Outcomes  Short Term: Able to use RPE daily in rehab  to express subjective intensity level;Long Term:  Able to use RPE to guide intensity level when exercising independently   Knowledge and understanding of Target Heart Rate Range (THRR)  Yes   Intervention  Provide education and explanation of THRR including how the numbers were predicted and where they are located for reference   Expected Outcomes  Short Term: Able to state/look up THRR;Long Term: Able to use THRR to govern intensity when exercising independently;Short Term: Able to use daily as guideline for intensity in rehab   Able to check pulse independently  Yes   Intervention  Provide education and demonstration on how to check pulse in carotid and radial arteries.;Review the importance of being able to check your own pulse for safety during independent exercise   Expected Outcomes  Short Term: Able to explain why pulse checking is important during independent exercise;Long Term: Able to check pulse independently and accurately   Understanding of Exercise Prescription  Yes   Intervention  Provide education, explanation, and written materials on patient's individual exercise prescription   Expected Outcomes  Short Term: Able to explain program exercise prescription;Long Term: Able to explain home exercise prescription to exercise independently          Exercise Goals Re-Evaluation : Exercise Goals Re-Evaluation    Exercise Goal Re-Evaluation    Row Name 06/30/18 0934 07/07/18 1028 07/20/18 1418 08/25/18 0947 09/21/18 1416   Exercise Goals Review  Increase Physical Activity;Increase Strength and Stamina;Able to understand and use rate of perceived exertion (RPE) scale;Knowledge and understanding of Target Heart Rate Range (THRR);Understanding of Exercise Prescription;Able to check pulse independently  Increase Physical Activity;Able to understand and use rate of perceived exertion (RPE) scale;Knowledge and understanding of Target Heart Rate Range (THRR);Understanding of Exercise  Prescription;Able to check pulse independently;Increase Strength and Stamina  Increase Physical Activity;Able to understand and use rate of perceived exertion (RPE) scale;Knowledge and understanding of Target Heart Rate Range (THRR);Understanding of Exercise Prescription;Able to check pulse independently;Increase Strength and Stamina  Increase Physical Activity;Increase Strength and Stamina;Able to understand and use rate of perceived exertion (RPE) scale;Knowledge and understanding of Target Heart Rate Range (THRR);Understanding of Exercise Prescription;Able to check pulse independently  Increase Physical Activity;Increase Strength and Stamina;Able to understand and use rate of perceived exertion (RPE) scale;Knowledge and understanding of Target Heart Rate Range (THRR);Understanding of Exercise Prescription;Able to check pulse independently   Comments  Pt first day of exercise. Tolerated exercise prescription well. Will increase workloads as tolerated.   Reviewed HEP with Pt. Pt was responsive and understood THRR, RPE scale, weather precautions, warm up and cool down stretches, and NTG use. Pt is walking at home for exercise 2-4 days for 30-45 minutes.   Reviewed METs and goals with Pt. MET level is 3.1 and has not increased since last check. Encourages Pt to increase MET level with each visit. Pt walking at home 2-4 days in addition to cardiac rehab.   Reviewed METs and goals with Pt. MET level is 4.0 and has increased since last check. Pt continues to increase MET level with each visit. Pt walking at home 7 days a week for 30 minutes in addition to cardiac rehab.   Reviewed METs and goals with Pt. MET level is 5.9 and  has increased since last check. Pt continues to increase MET level with each visit. Pt walking at home 7 days a week for 30 minutes in addition to cardiac rehab.    Expected Outcomes  Will continue to monitor and progress Pt as tolerated.   Will continue to monitor and progress Pt as tolerated.    Will continue to monitor and progress Pt as tolerated.   Will continue to monitor and progress Pt as tolerated.   Will continue to monitor and progress Pt as tolerated.           Discharge Exercise Prescription (Final Exercise Prescription Changes): Exercise Prescription Changes - 09/08/18 0900    Response to Exercise          Blood Pressure (Admit)  122/70    Blood Pressure (Exercise)  130/64    Blood Pressure (Exit)  104/60    Heart Rate (Admit)  67 bpm    Heart Rate (Exercise)  137 bpm    Heart Rate (Exit)  73 bpm    Rating of Perceived Exertion (Exercise)  14    Duration  Progress to 30 minutes of  aerobic without signs/symptoms of physical distress    Intensity  THRR unchanged        Progression          Progression  Continue to progress workloads to maintain intensity without signs/symptoms of physical distress.    Average METs  5.9        Resistance Training          Training Prescription  No        Treadmill          MPH  3.8    Grade  3    Minutes  10        Recumbant Bike          Level  3    Watts  60    Minutes  10    METs  4.2        Arm Ergometer          Level  --    Watts  --    Minutes  --    METs  --        Elliptical          Level  2    Speed  2    Minutes  10    METs  8.1        Home Exercise Plan          Plans to continue exercise at  Home (comment)    Frequency  Add 3 additional days to program exercise sessions.    Initial Home Exercises Provided  07/07/18           Nutrition:  Target Goals: Understanding of nutrition guidelines, daily intake of sodium <1551m, cholesterol <2052m calories 30% from fat and 7% or less from saturated fats, daily to have 5 or more servings of fruits and vegetables.  Biometrics: Pre Biometrics - 06/24/18 1023    Pre Biometrics          Height  _0  (1.778 m)    Weight  73.2 kg    Waist Circumference  35 inches    Hip Circumference  38 inches    Waist to Hip Ratio  0.92 %     BMI (Calculated)  23.16    Triceps Skinfold  22 mm    % Body Fat  24.8 %  Grip Strength  42 kg    Flexibility  14 in    Single Leg Stand  18.31 seconds          Post Biometrics - 09/13/18 1017     Post  Biometrics          Height  '5\' 10"'$  (1.778 m)    Weight  74 kg    Waist Circumference  34 inches    Hip Circumference  37.5 inches    Waist to Hip Ratio  0.91 %    BMI (Calculated)  23.41    Triceps Skinfold  22 mm    % Body Fat  24.4 %    Grip Strength  45 kg    Flexibility  15 in    Single Leg Stand  30 seconds           Nutrition Therapy Plan and Nutrition Goals: Nutrition Therapy & Goals - 06/24/18 1006    Nutrition Therapy          Diet  heart healthy        Personal Nutrition Goals          Nutrition Goal  Pt to identify and limit food sources of saturated fat, trans fat, refined carbohydrates and sodium    Personal Goal #2  Pt to decrease number of meals eaten away from home    Personal Goal #3  Pt to try 1 new recipe a week        Intervention Plan          Intervention  Prescribe, educate and counsel regarding individualized specific dietary modifications aiming towards targeted core components such as weight, hypertension, lipid management, diabetes, heart failure and other comorbidities.    Expected Outcomes  Short Term Goal: Understand basic principles of dietary content, such as calories, fat, sodium, cholesterol and nutrients.;Long Term Goal: Adherence to prescribed nutrition plan.           Nutrition Assessments: Nutrition Assessments - 06/24/18 1006    MEDFICTS Scores          Pre Score  18           Nutrition Goals Re-Evaluation:   Nutrition Goals Re-Evaluation:   Nutrition Goals Discharge (Final Nutrition Goals Re-Evaluation):   Psychosocial: Target Goals: Acknowledge presence or absence of significant depression and/or stress, maximize coping skills, provide positive support system. Participant is able to verbalize types and  ability to use techniques and skills needed for reducing stress and depression.  Initial Review & Psychosocial Screening: Initial Psych Review & Screening - 06/24/18 1151    Initial Review          Current issues with  None Identified        Family Dynamics          Good Support System?  Yes   Pt lists his wife as a source of support.        Barriers          Psychosocial barriers to participate in program  There are no identifiable barriers or psychosocial needs.        Screening Interventions          Interventions  Encouraged to exercise           Quality of Life Scores: Quality of Life - 06/24/18 1024    Quality of Life          Select  Quality of Life        Quality of  Life Scores          Health/Function Pre  25.6 %    Socioeconomic Pre  24.56 %    Psych/Spiritual Pre  28.79 %    Family Pre  28.8 %    GLOBAL Pre  26.46 %          Scores of 19 and below usually indicate a poorer quality of life in these areas.  A difference of  2-3 points is a clinically meaningful difference.  A difference of 2-3 points in the total score of the Quality of Life Index has been associated with significant improvement in overall quality of life, self-image, physical symptoms, and general health in studies assessing change in quality of life.  PHQ-9: Recent Review Flowsheet Data    Depression screen [facility] 2/9 06/30/2018 05/22/2014   Decreased Interest 0 0   Down, Depressed, Hopeless 0 0   PHQ - 2 Score 0 0     Interpretation of Total Score  Total Score Depression Severity:  1-4 = Minimal depression, 5-9 = Mild depression, 10-14 = Moderate depression, 15-19 = Moderately severe depression, 20-27 = Severe depression   Psychosocial Evaluation and Intervention: Psychosocial Evaluation - 06/30/18 0919    Psychosocial Evaluation & Interventions          Interventions  Encouraged to exercise with the program and follow exercise prescription;Stress management education;Relaxation  education    Comments  pt c/o work related stress and anxiety. pt unhappy with current job. 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 pt also concerned about his wifes current state of health.  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 pt currently walking for exercise.     Expected Outcomes  pt will exhibit improved outlook and coping skills.     Continue Psychosocial Services   Follow up required by staff           Psychosocial Re-Evaluation: Psychosocial Re-Evaluation    Psychosocial Re-Evaluation    [place] Name 07/23/18 1016 08/20/18 0715 09/21/18 1345   Current issues with  Current Anxiety/Panic;Current Stress Concerns  Current Anxiety/Panic;Current Stress Concerns  Current Anxiety/Panic;Current Stress Concerns   Comments  pt with work and health related stress/anxiety. pt pleased that his wife's health concerns are improving. pt currently seeking new employment opportunities.   pt with work and health related stress/anxiety. pt pleased that his wife's health concerns are improving. pt currently seeking new employment opportunities. pt demonstrates increased confidence with health/work concerns.   pt with work and health related stress/anxiety. pt pleased that his wife's health concerns are improving. pt currently seeking new employment opportunities. pt demonstrates increased confidence with health/work concerns.    Expected Outcomes  pt will exhibit improved outlook and coping skills with hopeful outlook,  pt will exhibit improved outlook and coping skills with hopeful outlook,  pt will exhibit improved outlook and coping skills with hopeful outlook,   Interventions  Stress management education;Relaxation education;Encouraged to attend Cardiac Rehabilitation for the exercise  Stress management education;Relaxation education;Encouraged to attend Cardiac Rehabilitation for the exercise  Stress management education;Relaxation  education;Encouraged to attend Cardiac Rehabilitation for the exercise   Continue Psychosocial Services   Follow up required by staff  Follow up required by staff  Follow up required by staff       Initial Review    [place] Name 07/23/18 1016 08/20/18 0715 09/21/18 1345   Source of Stress Concerns  no documentation  no documentation  Chronic  Illness;Unable to participate in former interests or hobbies          Psychosocial Discharge (Final Psychosocial Re-Evaluation): Psychosocial Re-Evaluation - 09/21/18 1345    Psychosocial Re-Evaluation          Current issues with  Current Anxiety/Panic;Current Stress Concerns    Comments  pt with work and health related stress/anxiety. pt pleased that his wife's health concerns are improving. pt currently seeking new employment opportunities. pt demonstrates increased confidence with health/work concerns.     Expected Outcomes  pt will exhibit improved outlook and coping skills with hopeful outlook,    Interventions  Stress management education;Relaxation education;Encouraged to attend Cardiac Rehabilitation for the exercise    Continue Psychosocial Services   Follow up required by staff        Initial Review          Source of Stress Concerns  Chronic Illness;Unable to participate in former interests or hobbies           Vocational Rehabilitation: Provide vocational rehab assistance to qualifying candidates.   Vocational Rehab Evaluation & Intervention: Vocational Rehab - 07/23/18 1018    Initial Vocational Rehab Evaluation & Intervention          Assessment shows need for Vocational Rehabilitation  Yes    Vocational Rehab Packet given to patient  06/30/18           Education: Education Goals: Education classes will be provided on a weekly basis, covering required topics. Participant will state understanding/return demonstration of topics presented.  Learning Barriers/Preferences: Learning Barriers/Preferences - 06/24/18 1025     Learning Barriers/Preferences          Learning Barriers  Sight;Hearing    Learning Preferences  Verbal Instruction;Audio;Pictoral;Video;Written Material           Education Topics: Count Your Pulse:  -Group instruction provided by verbal instruction, demonstration, patient participation and written materials to support subject.  Instructors address importance of being able to find your pulse and how to count your pulse when at home without a heart monitor.  Patients get hands on experience counting their pulse with staff help and individually.   Heart Attack, Angina, and Risk Factor Modification:  -Group instruction provided by verbal instruction, video, and written materials to support subject.  Instructors address signs and symptoms of angina and heart attacks.    Also discuss risk factors for heart disease and how to make changes to improve heart health risk factors.   Functional Fitness:  -Group instruction provided by verbal instruction, demonstration, patient participation, and written materials to support subject.  Instructors address safety measures for doing things around the house.  Discuss how to get up and down off the floor, how to pick things up properly, how to safely get out of a chair without assistance, and balance training.   Meditation and Mindfulness:  -Group instruction provided by verbal instruction, patient participation, and written materials to support subject.  Instructor addresses importance of mindfulness and meditation practice to help reduce stress and improve awareness.  Instructor also leads participants through a meditation exercise.  Flowsheet Row CARDIAC REHAB PHASE II EXERCISE from 08/04/2018 in [place]  Date  08/04/18  Educator  [place]  Instruction Review Code  2- Demonstrated Understanding      Stretching for Flexibility and Mobility:  -Group instruction provided by verbal instruction, patient participation,  and written materials to support subject.  Instructors lead participants through series of stretches that are designed to  increase flexibility thus improving mobility.  These stretches are additional exercise for major muscle groups that are typically performed during regular warm up and cool down.   Hands Only CPR:  -Group verbal, video, and participation provides a basic overview of AHA guidelines for community CPR. Role-play of emergencies allow participants the opportunity to practice calling for help and chest compression technique with discussion of AED use.   Hypertension: -Group verbal and written instruction that provides a basic overview of hypertension including the most recent diagnostic guidelines, risk factor reduction with self-care instructions and medication management.    Nutrition I class: Heart Healthy Eating:  -Group instruction provided by PowerPoint slides, verbal discussion, and written materials to support subject matter. The instructor gives an explanation and review of the Therapeutic Lifestyle Changes diet recommendations, which includes a discussion on lipid goals, dietary fat, sodium, fiber, plant stanol/sterol esters, sugar, and the components of a well-balanced, healthy diet.   Nutrition II class: Lifestyle Skills:  -Group instruction provided by PowerPoint slides, verbal discussion, and written materials to support subject matter. The instructor gives an explanation and review of label reading, grocery shopping for heart health, heart healthy recipe modifications, and ways to make healthier choices when eating out.   Diabetes Question & Answer:  -Group instruction provided by PowerPoint slides, verbal discussion, and written materials to support subject matter. The instructor gives an explanation and review of diabetes co-morbidities, pre- and post-prandial blood glucose goals, pre-exercise blood glucose goals, signs, symptoms, and treatment of hypoglycemia and  hyperglycemia, and foot care basics.   Diabetes Blitz:  -Group instruction provided by PowerPoint slides, verbal discussion, and written materials to support subject matter. The instructor gives an explanation and review of the physiology behind type 1 and type 2 diabetes, diabetes medications and rational behind using different medications, pre- and post-prandial blood glucose recommendations and Hemoglobin A1c goals, diabetes diet, and exercise including blood glucose guidelines for exercising safely.    Portion Distortion:  -Group instruction provided by PowerPoint slides, verbal discussion, written materials, and food models to support subject matter. The instructor gives an explanation of serving size versus portion size, changes in portions sizes over the last 20 years, and what consists of a serving from each food group.   Stress Management:  -Group instruction provided by verbal instruction, video, and written materials to support subject matter.  Instructors review role of stress in heart disease and how to cope with stress positively.     Exercising on Your Own:  -Group instruction provided by verbal instruction, power point, and written materials to support subject.  Instructors discuss benefits of exercise, components of exercise, frequency and intensity of exercise, and end points for exercise.  Also discuss use of nitroglycerin and activating EMS.  Review options of places to exercise outside of rehab.  Review guidelines for sex with heart disease. Flowsheet Row CARDIAC REHAB PHASE II EXERCISE from 08/04/2018 in [place]  Date  07/07/18  Educator  ep  Instruction Review Code  2- Demonstrated Understanding      Cardiac Drugs I:  -Group instruction provided by verbal instruction and written materials to support subject.  Instructor reviews cardiac drug classes: antiplatelets, anticoagulants, beta blockers, and statins.  Instructor discusses  reasons, side effects, and lifestyle considerations for each drug class.   Cardiac Drugs II:  -Group instruction provided by verbal instruction and written materials to support subject.  Instructor reviews cardiac drug classes: angiotensin converting enzyme inhibitors (ACE-I), angiotensin II receptor  blockers (ARBs), nitrates, and calcium channel blockers.  Instructor discusses reasons, side effects, and lifestyle considerations for each drug class.   Anatomy and Physiology of the Circulatory System:  Group verbal and written instruction and models provide basic cardiac anatomy and physiology, with the coronary electrical and arterial systems. Review of: AMI, Angina, Valve disease, Heart Failure, Peripheral Artery Disease, Cardiac Arrhythmia, Pacemakers, and the ICD.   Other Education:  -Group or individual verbal, written, or video instructions that support the educational goals of the cardiac rehab program.   Holiday Eating Survival Tips:  -Group instruction provided by PowerPoint slides, verbal discussion, and written materials to support subject matter. The instructor gives patients tips, tricks, and techniques to help them not only survive but enjoy the holidays despite the onslaught of food that accompanies the holidays.   Knowledge Questionnaire Score: Knowledge Questionnaire Score - 06/24/18 1026    Knowledge Questionnaire Score          Pre Score  22/24           Core Components/Risk Factors/Patient Goals at Admission: Personal Goals and Risk Factors at Admission - 06/24/18 1026    Core Components/Risk Factors/Patient Goals on Admission           Weight Management  Yes;Weight Maintenance    Intervention  Weight Management: Develop a combined nutrition and exercise program designed to reach desired caloric intake, while maintaining appropriate intake of nutrient and fiber, sodium and fats, and appropriate energy expenditure required for the weight goal.;Weight Management:  Provide education and appropriate resources to help participant work on and attain dietary goals.    Admit Weight  161 lb 6 oz (73.2 kg)    Expected Outcomes  Short Term: Continue to assess and modify interventions until short term weight is achieved;Long Term: Adherence to nutrition and physical activity/exercise program aimed toward attainment of established weight goal;Weight Maintenance: Understanding of the daily nutrition guidelines, which includes 25-35% calories from fat, 7% or less cal from saturated fats, less than '200mg'$  cholesterol, less than 1.5gm of sodium, & 5 or more servings of fruits and vegetables daily;Understanding recommendations for meals to include 15-35% energy as protein, 25-35% energy from fat, 35-60% energy from carbohydrates, less than '200mg'$  of dietary cholesterol, 20-35 gm of total fiber daily;Understanding of distribution of calorie intake throughout the day with the consumption of 4-5 meals/snacks    Hypertension  Yes    Intervention  Provide education on lifestyle modifcations including regular physical activity/exercise, weight management, moderate sodium restriction and increased consumption of fresh fruit, vegetables, and low fat dairy, alcohol moderation, and smoking cessation.;Monitor prescription use compliance.    Expected Outcomes  Short Term: Continued assessment and intervention until BP is < 140/81m HG in hypertensive participants. < 130/852mHG in hypertensive participants with diabetes, heart failure or chronic kidney disease.;Long Term: Maintenance of blood pressure at goal levels.    Lipids  Yes    Intervention  Provide education and support for participant on nutrition & aerobic/resistive exercise along with prescribed medications to achieve LDL '70mg'$ , HDL >'40mg'$ .    Expected Outcomes  Short Term: Participant states understanding of desired cholesterol values and is compliant with medications prescribed. Participant is following exercise prescription and  nutrition guidelines.;Long Term: Cholesterol controlled with medications as prescribed, with individualized exercise RX and with personalized nutrition plan. Value goals: LDL < '70mg'$ , HDL > 40 mg.    Stress  Yes    Intervention  Offer individual and/or small group education and counseling on adjustment to  heart disease, stress management and health-related lifestyle change. Teach and support self-help strategies.;Refer participants experiencing significant psychosocial distress to appropriate mental health specialists for further evaluation and treatment. When possible, include family members and significant others in education/counseling sessions.    Expected Outcomes  Short Term: Participant demonstrates changes in health-related behavior, relaxation and other stress management skills, ability to obtain effective social support, and compliance with psychotropic medications if prescribed.;Long Term: Emotional wellbeing is indicated by absence of clinically significant psychosocial distress or social isolation.           Core Components/Risk Factors/Patient Goals Review:  Goals and Risk Factor Review    Core Components/Risk Factors/Patient Goals Review    Row Name 06/30/18 0921 07/23/18 1015 08/20/18 0716 09/21/18 1346   Personal Goals Review  Weight Management/Obesity;Hypertension;Lipids;Stress  Weight Management/Obesity;Hypertension;Lipids;Stress  Weight Management/Obesity;Hypertension;Lipids;Stress  Weight Management/Obesity;Hypertension;Lipids;Stress   Review  pt with multiple CAD RF demonstrates eagerness to participate in CR program.  pt concerned about his disease progression but primarily discouraged with decreased strength/stamina and weakness. pt looking forward to improved functional capacity.   pt with multiple CAD RF demonstrates eagerness to participate in CR program.  pt concerned about his disease progression but primarily discouraged with decreased strength/stamina and weakness. pt  looking forward to improved functional capacity. pt HEP is walking dog 67mn twice daily, which equates to approximately 2 miles.    pt with multiple CAD RF demonstrates eagerness to participate in CR program.  pt concerned about his disease progression but primarily discouraged with decreased strength/stamina and weakness. pt looking forward to improved functional capacity. pt HEP is walking dog 398m twice daily, which equates to approximately 2 miles.  pt notes increased stamina and improved food choices.    pt with multiple CAD RF demonstrates eagerness to participate in CR program.  pt concerned about his disease progression but primarily discouraged with decreased strength/stamina and weakness. pt looking forward to improved functional capacity. pt HEP is walking dog 3071mtwice daily, which equates to approximately 2 miles.  pt notes increased stamina and improved food choices.  pt has resumed using [place] MDI as prescribed. He notes improved DOE symptoms with inhaler use.     Expected Outcomes  pt will participate in CR exercise, nutrition and lifestyle modification opporunities to decrease overall RF.    pt will participate in CR exercise, nutrition and lifestyle modification opporunities to decrease overall RF.    pt will participate in CR exercise, nutrition and lifestyle modification opporunities to decrease overall RF.    pt will participate in CR exercise, nutrition and lifestyle modification opporunities to decrease overall RF.            Core Components/Risk Factors/Patient Goals at Discharge (Final Review):  Goals and Risk Factor Review - 09/21/18 1346    Core Components/Risk Factors/Patient Goals Review          Personal Goals Review  Weight Management/Obesity;Hypertension;Lipids;Stress    Review  pt with multiple CAD RF demonstrates eagerness to participate in CR program.  pt concerned about his disease progression but primarily discouraged with decreased strength/stamina and weakness. pt  looking forward to improved functional capacity. pt HEP is walking dog 7m80mwice daily, which equates to approximately 2 miles.  pt notes increased stamina and improved food choices.  pt has resumed using [place] MDI as prescribed. He notes improved DOE symptoms with inhaler use.      Expected Outcomes  pt will participate in CR exercise, nutrition and lifestyle modification opporunities to decrease  overall RF.             ITP Comments: ITP Comments    Row Name 06/24/18 [phone] 06/30/18 0918 07/28/18 1446 08/20/18 0714 09/21/18 1345   ITP Comments  Dr. Fransico Him, Medical Director  pt started group exercise. pt tolerated light activity without difficulty. pt oriented to exercise equipment and safety routine.  understanding verbalized  30 day ITP review. pt with good attendance and participation.   30 day ITP review. pt with good attendance and participation. pt demonstrates eagerness to participate in CR exercise.   30 day ITP review. pt with good attendance and participation. pt demonstrates eagerness to participate in CR exercise.       Comments:

## 2018-09-24 ENCOUNTER — Ambulatory Visit (HOSPITAL_COMMUNITY): Payer: 59

## 2018-09-24 ENCOUNTER — Encounter (HOSPITAL_COMMUNITY): Payer: 59

## 2018-09-27 ENCOUNTER — Ambulatory Visit (HOSPITAL_COMMUNITY): Payer: 59

## 2018-09-27 ENCOUNTER — Encounter (HOSPITAL_COMMUNITY)
Admission: RE | Admit: 2018-09-27 | Discharge: 2018-09-27 | Disposition: A | Discharge status: Home / Self Care | Payer: 59 | Source: Ambulatory Visit | Attending: Cardiology | Admitting: Cardiology

## 2018-09-27 DIAGNOSIS — Z955 Presence of coronary angioplasty implant and graft: Secondary | ICD-10-CM

## 2018-09-29 ENCOUNTER — Ambulatory Visit (HOSPITAL_COMMUNITY): Payer: 59

## 2018-09-29 ENCOUNTER — Other Ambulatory Visit: Payer: Self-pay

## 2018-09-29 ENCOUNTER — Encounter (HOSPITAL_COMMUNITY)
Admission: RE | Admit: 2018-09-29 | Discharge: 2018-09-29 | Disposition: A | Discharge status: Home / Self Care | Payer: 59 | Source: Ambulatory Visit | Attending: Cardiology | Admitting: Cardiology

## 2018-09-29 DIAGNOSIS — Z955 Presence of coronary angioplasty implant and graft: Secondary | ICD-10-CM | POA: Diagnosis not present

## 2018-10-01 ENCOUNTER — Ambulatory Visit (HOSPITAL_COMMUNITY): Payer: 59

## 2018-10-01 ENCOUNTER — Other Ambulatory Visit: Payer: Self-pay

## 2018-10-01 ENCOUNTER — Encounter (HOSPITAL_COMMUNITY)
Admission: RE | Admit: 2018-10-01 | Discharge: 2018-10-01 | Disposition: A | Discharge status: Home / Self Care | Payer: 59 | Source: Ambulatory Visit | Attending: Cardiology | Admitting: Cardiology

## 2018-10-01 DIAGNOSIS — Z955 Presence of coronary angioplasty implant and graft: Secondary | ICD-10-CM

## 2018-10-04 ENCOUNTER — Ambulatory Visit (HOSPITAL_COMMUNITY): Payer: 59

## 2018-10-06 ENCOUNTER — Ambulatory Visit (HOSPITAL_COMMUNITY): Payer: 59

## 2018-10-08 ENCOUNTER — Ambulatory Visit (HOSPITAL_COMMUNITY): Payer: 59

## 2018-10-08 ENCOUNTER — Encounter (HOSPITAL_COMMUNITY): Payer: Self-pay

## 2018-10-08 NOTE — Progress Notes (Addendum)
Discharge Progress Report  Patient Details  Name: Jerry Montoya MRN: [id number] Date of Birth: 17-Sep-1955 Referring Provider:   Flowsheet Row CARDIAC REHAB PHASE II ORIENTATION from 06/24/2018 in [place]  Referring Provider  Dr. Aundra Dubin       Number of Visits: 35  Reason for Discharge:  Patient has met program and personal goals.  Smoking History:  Social History   Tobacco Use  Smoking Status Never Smoker  Smokeless Tobacco Never Used    Diagnosis:  Status post coronary artery stent placement  ADL UCSD:   Initial Exercise Prescription: Initial Exercise Prescription - 06/24/18 1000    Date of Initial Exercise RX and Referring Provider          Date  06/24/18    Referring Provider  Dr. Aundra Dubin    Expected Discharge Date  09/29/18        Treadmill          MPH  3.4    Grade  0    Minutes  10        Recumbant Bike          Level  2    Watts  60    Minutes  10    METs  4.52        NuStep          Level  3    SPM  85    Minutes  10    METs  4.4        Prescription Details          Frequency (times per week)  3    Duration  Progress to 30 minutes of continuous aerobic without signs/symptoms of physical distress        Intensity          THRR 40-80% of Max Heartrate  62-125    Ratings of Perceived Exertion  11-13        Progression          Progression  Continue to progress workloads to maintain intensity without signs/symptoms of physical distress.        Resistance Training          Training Prescription  Yes    Weight  3 lbs.     Reps  10-15           Discharge Exercise Prescription (Final Exercise Prescription Changes): Exercise Prescription Changes - 10/01/18 0838    Response to Exercise          Blood Pressure (Admit)  132/78    Blood Pressure (Exercise)  138/64    Blood Pressure (Exit)  112/64    Heart Rate (Admit)  99 bpm    Heart Rate (Exercise)  141 bpm    Heart Rate (Exit)  97 bpm     Rating of Perceived Exertion (Exercise)  13    Comments  Pt last day of exercise.     Duration  Progress to 30 minutes of  aerobic without signs/symptoms of physical distress    Intensity  THRR unchanged        Progression          Progression  Continue to progress workloads to maintain intensity without signs/symptoms of physical distress.    Average METs  4.96        Resistance Training          Training Prescription  Yes    Weight  6 lbs.  Reps  10-15    Time  10 Minutes        Treadmill          MPH  3.8    Grade  3    Minutes  10        Recumbant Bike          Level  3    Watts  60    Minutes  10    METs  3.4        Elliptical          Level  2    Speed  2    Minutes  10    METs  6        Home Exercise Plan          Plans to continue exercise at  Home (comment)    Frequency  Add 3 additional days to program exercise sessions.    Initial Home Exercises Provided  07/07/18           Functional Capacity: 6 Minute Walk    6 Minute Walk    Row Name 06/24/18 1019 09/13/18 1014   Phase  Initial  Discharge   Distance  2032 feet  2267 feet   Distance % Change  no documentation  11.56 %   Distance Feet Change  no documentation  235 ft   Walk Time  6 minutes  6 minutes   # of Rest Breaks  0  0   MPH  3.85  4.3   METS  4.69  5.3   RPE  12  11   VO2 Peak  16.41  18.63   Symptoms  No  No   Resting HR  68 bpm  64 bpm   Resting BP  108/58  120/74   Resting Oxygen Saturation   96 %  no documentation   Exercise Oxygen Saturation  during 6 min walk  96 %  no documentation   Max Ex. HR  92 bpm  115 bpm   Max Ex. BP  122/70  126/72   2 Minute Post BP  118/62  112/74          Psychological, QOL, Others - Outcomes: PHQ 2/9: Depression screen [facility] 2/9 06/30/2018 05/22/2014  Decreased Interest 0 0  Down, Depressed, Hopeless 0 0  PHQ - 2 Score 0 0    Quality of Life: Quality of Life - 06/24/18 1024    Quality of Life          Select  Quality  of Life        Quality of Life Scores          Health/Function Pre  25.6 %    Socioeconomic Pre  24.56 %    Psych/Spiritual Pre  28.79 %    Family Pre  28.8 %    GLOBAL Pre  26.46 %           Personal Goals: Goals established at orientation with interventions provided to work toward goal. Personal Goals and Risk Factors at Admission - 06/24/18 1026    Core Components/Risk Factors/Patient Goals on Admission           Weight Management  Yes;Weight Maintenance    Intervention  Weight Management: Develop a combined nutrition and exercise program designed to reach desired caloric intake, while maintaining appropriate intake of nutrient and fiber, sodium and fats, and appropriate energy expenditure required for the weight goal.;Weight Management: Provide education and appropriate  resources to help participant work on and attain dietary goals.    Admit Weight  161 lb 6 oz (73.2 kg)    Expected Outcomes  Short Term: Continue to assess and modify interventions until short term weight is achieved;Long Term: Adherence to nutrition and physical activity/exercise program aimed toward attainment of established weight goal;Weight Maintenance: Understanding of the daily nutrition guidelines, which includes 25-35% calories from fat, 7% or less cal from saturated fats, less than 216m cholesterol, less than 1.5gm of sodium, & 5 or more servings of fruits and vegetables daily;Understanding recommendations for meals to include 15-35% energy as protein, 25-35% energy from fat, 35-60% energy from carbohydrates, less than 2061mof dietary cholesterol, 20-35 gm of total fiber daily;Understanding of distribution of calorie intake throughout the day with the consumption of 4-5 meals/snacks    Hypertension  Yes    Intervention  Provide education on lifestyle modifcations including regular physical activity/exercise, weight management, moderate sodium restriction and increased consumption of fresh fruit, vegetables,  and low fat dairy, alcohol moderation, and smoking cessation.;Monitor prescription use compliance.    Expected Outcomes  Short Term: Continued assessment and intervention until BP is < 140/9028mG in hypertensive participants. < 130/32m56m in hypertensive participants with diabetes, heart failure or chronic kidney disease.;Long Term: Maintenance of blood pressure at goal levels.    Lipids  Yes    Intervention  Provide education and support for participant on nutrition & aerobic/resistive exercise along with prescribed medications to achieve LDL <70mg33mL >40mg.68mExpected Outcomes  Short Term: Participant states understanding of desired cholesterol values and is compliant with medications prescribed. Participant is following exercise prescription and nutrition guidelines.;Long Term: Cholesterol controlled with medications as prescribed, with individualized exercise RX and with personalized nutrition plan. Value goals: LDL < 70mg, 22m> 40 mg.    Stress  Yes    Intervention  Offer individual and/or small group education and counseling on adjustment to heart disease, stress management and health-related lifestyle change. Teach and support self-help strategies.;Refer participants experiencing significant psychosocial distress to appropriate mental health specialists for further evaluation and treatment. When possible, include family members and significant others in education/counseling sessions.    Expected Outcomes  Short Term: Participant demonstrates changes in health-related behavior, relaxation and other stress management skills, ability to obtain effective social support, and compliance with psychotropic medications if prescribed.;Long Term: Emotional wellbeing is indicated by absence of clinically significant psychosocial distress or social isolation.            Personal Goals Discharge: Goals and Risk Factor Review    Core Components/Risk Factors/Patient Goals Review    Row Name 06/30/18  0921 [id number] 08/20/18 0716 09/21/18 1346 10/08/18 0737   Personal Goals Review  Weight Management/Obesity;Hypertension;Lipids;Stress  Weight Management/Obesity;Hypertension;Lipids;Stress  Weight Management/Obesity;Hypertension;Lipids;Stress  Weight Management/Obesity;Hypertension;Lipids;Stress  Weight Management/Obesity;Hypertension;Lipids;Stress   Review  pt with multiple CAD RF demonstrates eagerness to participate in CR program.  pt concerned about his disease progression but primarily discouraged with decreased strength/stamina and weakness. pt looking forward to improved functional capacity.   pt with multiple CAD RF demonstrates eagerness to participate in CR program.  pt concerned about his disease progression but primarily discouraged with decreased strength/stamina and weakness. pt looking forward to improved functional capacity. pt HEP is walking dog 30min t12m daily, which equates to approximately 2 miles.    pt with multiple CAD RF demonstrates eagerness to participate in CR program.  pt concerned about his disease progression but primarily discouraged with decreased  strength/stamina and weakness. pt looking forward to improved functional capacity. pt HEP is walking dog 36mn twice daily, which equates to approximately 2 miles.  pt notes increased stamina and improved food choices.    pt with multiple CAD RF demonstrates eagerness to participate in CR program.  pt concerned about his disease progression but primarily discouraged with decreased strength/stamina and weakness. pt looking forward to improved functional capacity. pt HEP is walking dog 375m twice daily, which equates to approximately 2 miles.  pt notes increased stamina and improved food choices.  pt has resumed using [place] MDI as prescribed. He notes improved DOE symptoms with inhaler use.    pt with multiple CAD RF demonstrates eagerness to participate in CR program.  pt concerned about his disease progression but primarily  discouraged with decreased strength/stamina and weakness. pt looking forward to improved functional capacity. pt HEP is walking dog 3073mtwice daily, which equates to approximately 2 miles.  pt notes increased stamina and improved food choices.  pt has resumed using [place] MDI as prescribed. He notes improved DOE symptoms with inhaler use.     Expected Outcomes  pt will participate in CR exercise, nutrition and lifestyle modification opporunities to decrease overall RF.    pt will participate in CR exercise, nutrition and lifestyle modification opporunities to decrease overall RF.    pt will participate in CR exercise, nutrition and lifestyle modification opporunities to decrease overall RF.    pt will participate in CR exercise, nutrition and lifestyle modification opporunities to decrease overall RF.    pt will participate in CR exercise, nutrition and lifestyle modification opporunities to decrease overall RF.            Exercise Goals and Review: Exercise Goals    Exercise Goals    Row Name 06/24/18 1022   Increase Physical Activity  Yes   Intervention  Provide advice, education, support and counseling about physical activity/exercise needs.;Develop an individualized exercise prescription for aerobic and resistive training based on initial evaluation findings, risk stratification, comorbidities and participant's personal goals.   Expected Outcomes  Short Term: Attend rehab on a regular basis to increase amount of physical activity.   Increase Strength and Stamina  Yes   Intervention  Provide advice, education, support and counseling about physical activity/exercise needs.;Develop an individualized exercise prescription for aerobic and resistive training based on initial evaluation findings, risk stratification, comorbidities and participant's personal goals.   Expected Outcomes  Short Term: Increase workloads from initial exercise prescription for resistance, speed, and METs.   Able to understand  and use rate of perceived exertion (RPE) scale  Yes   Intervention  Provide education and explanation on how to use RPE scale   Expected Outcomes  Short Term: Able to use RPE daily in rehab to express subjective intensity level;Long Term:  Able to use RPE to guide intensity level when exercising independently   Knowledge and understanding of Target Heart Rate Range (THRR)  Yes   Intervention  Provide education and explanation of THRR including how the numbers were predicted and where they are located for reference   Expected Outcomes  Short Term: Able to state/look up THRR;Long Term: Able to use THRR to govern intensity when exercising independently;Short Term: Able to use daily as guideline for intensity in rehab   Able to check pulse independently  Yes   Intervention  Provide education and demonstration on how to check pulse in carotid and radial arteries.;Review the importance of being able to check your  own pulse for safety during independent exercise   Expected Outcomes  Short Term: Able to explain why pulse checking is important during independent exercise;Long Term: Able to check pulse independently and accurately   Understanding of Exercise Prescription  Yes   Intervention  Provide education, explanation, and written materials on patient's individual exercise prescription   Expected Outcomes  Short Term: Able to explain program exercise prescription;Long Term: Able to explain home exercise prescription to exercise independently          Exercise Goals Re-Evaluation: Exercise Goals Re-Evaluation    Exercise Goal Re-Evaluation    Row Name 06/30/18 0934 07/07/18 1028 07/20/18 1418 08/25/18 0947 09/21/18 1416   Exercise Goals Review  Increase Physical Activity;Increase Strength and Stamina;Able to understand and use rate of perceived exertion (RPE) scale;Knowledge and understanding of Target Heart Rate Range (THRR);Understanding of Exercise Prescription;Able to check pulse independently   Increase Physical Activity;Able to understand and use rate of perceived exertion (RPE) scale;Knowledge and understanding of Target Heart Rate Range (THRR);Understanding of Exercise Prescription;Able to check pulse independently;Increase Strength and Stamina  Increase Physical Activity;Able to understand and use rate of perceived exertion (RPE) scale;Knowledge and understanding of Target Heart Rate Range (THRR);Understanding of Exercise Prescription;Able to check pulse independently;Increase Strength and Stamina  Increase Physical Activity;Increase Strength and Stamina;Able to understand and use rate of perceived exertion (RPE) scale;Knowledge and understanding of Target Heart Rate Range (THRR);Understanding of Exercise Prescription;Able to check pulse independently  Increase Physical Activity;Increase Strength and Stamina;Able to understand and use rate of perceived exertion (RPE) scale;Knowledge and understanding of Target Heart Rate Range (THRR);Understanding of Exercise Prescription;Able to check pulse independently   Comments  Pt first day of exercise. Tolerated exercise prescription well. Will increase workloads as tolerated.   Reviewed HEP with Pt. Pt was responsive and understood THRR, RPE scale, weather precautions, warm up and cool down stretches, and NTG use. Pt is walking at home for exercise 2-4 days for 30-45 minutes.   Reviewed METs and goals with Pt. MET level is 3.1 and has not increased since last check. Encourages Pt to increase MET level with each visit. Pt walking at home 2-4 days in addition to cardiac rehab.   Reviewed METs and goals with Pt. MET level is 4.0 and has increased since last check. Pt continues to increase MET level with each visit. Pt walking at home 7 days a week for 30 minutes in addition to cardiac rehab.   Reviewed METs and goals with Pt. MET level is 5.9 and has increased since last check. Pt continues to increase MET level with each visit. Pt walking at home 7 days a week  for 30 minutes in addition to cardiac rehab.    Expected Outcomes  Will continue to monitor and progress Pt as tolerated.   Will continue to monitor and progress Pt as tolerated.   Will continue to monitor and progress Pt as tolerated.   Will continue to monitor and progress Pt as tolerated.   Will continue to monitor and progress Pt as tolerated.        Exercise Goal Re-Evaluation    Row Name 10/07/18 0840   Exercise Goals Review  Increase Physical Activity;Increase Strength and Stamina;Able to understand and use rate of perceived exertion (RPE) scale;Knowledge and understanding of Target Heart Rate Range (THRR);Understanding of Exercise Prescription;Able to check pulse independently   Comments  Pt last day of Cardiac Rehab. Pt progressed through the program well. Pt MET level of 4.96. Pt continues to exericse  at home for 30-45 minutes per day by walking his dogs.    Expected Outcomes  Pt will continue exercise Rx at home.           Nutrition & Weight - Outcomes: Pre Biometrics - 06/24/18 1023    Pre Biometrics          Height  _0  (1.778 m)    Weight  73.2 kg    Waist Circumference  35 inches    Hip Circumference  38 inches    Waist to Hip Ratio  0.92 %    BMI (Calculated)  23.16    Triceps Skinfold  22 mm    % Body Fat  24.8 %    Grip Strength  42 kg    Flexibility  14 in    Single Leg Stand  18.31 seconds          Post Biometrics - 09/13/18 1017     Post  Biometrics          Height  _1  (1.778 m)    Weight  74 kg    Waist Circumference  34 inches    Hip Circumference  37.5 inches    Waist to Hip Ratio  0.91 %    BMI (Calculated)  23.41    Triceps Skinfold  22 mm    % Body Fat  24.4 %    Grip Strength  45 kg    Flexibility  15 in    Single Leg Stand  30 seconds           Nutrition: Nutrition Therapy & Goals - 06/24/18 1006    Nutrition Therapy          Diet  heart healthy        Personal Nutrition Goals          Nutrition Goal  Pt to identify and  limit food sources of saturated fat, trans fat, refined carbohydrates and sodium    Personal Goal #2  Pt to decrease number of meals eaten away from home    Personal Goal #3  Pt to try 1 new recipe a week        Intervention Plan          Intervention  Prescribe, educate and counsel regarding individualized specific dietary modifications aiming towards targeted core components such as weight, hypertension, lipid management, diabetes, heart failure and other comorbidities.    Expected Outcomes  Short Term Goal: Understand basic principles of dietary content, such as calories, fat, sodium, cholesterol and nutrients.;Long Term Goal: Adherence to prescribed nutrition plan.           Nutrition Discharge: Nutrition Assessments - 10/07/18 1352    MEDFICTS Scores          Pre Score  18    Post Score  --   pt did not return post survey          Education Questionnaire Score: Knowledge Questionnaire Score - 06/24/18 1026    Knowledge Questionnaire Score          Pre Score  22/24           Goals reviewed with patient; copy given to patient.

## 2018-10-11 ENCOUNTER — Ambulatory Visit (HOSPITAL_COMMUNITY): Payer: 59

## 2018-10-12 ENCOUNTER — Telehealth (HOSPITAL_COMMUNITY): Payer: Self-pay

## 2018-10-12 NOTE — Telephone Encounter (Signed)
Pt called and updated about extended closure of Cardiac Rehab for four weeks d/t COVID19. Tentative reopen date of 11/08/2018. No answer. VM left for Pt.

## 2018-10-13 ENCOUNTER — Ambulatory Visit (HOSPITAL_COMMUNITY): Payer: 59

## 2018-10-14 ENCOUNTER — Telehealth (HOSPITAL_COMMUNITY): Payer: Self-pay

## 2018-10-14 NOTE — Telephone Encounter (Signed)
[employer] sent in a fax requesting notes/diagnostic for the period of 05/03/2017-08/02/2017. Currently do not have any notes during that time period as well as for the month of march 2020. Called [employer] and spoke with an agent and informed her of this and she said no further information was needed at this time.

## 2018-10-15 ENCOUNTER — Ambulatory Visit (HOSPITAL_COMMUNITY): Payer: 59

## 2018-10-18 ENCOUNTER — Ambulatory Visit (HOSPITAL_COMMUNITY): Payer: 59

## 2018-10-20 ENCOUNTER — Ambulatory Visit (HOSPITAL_COMMUNITY): Payer: 59

## 2018-10-22 ENCOUNTER — Ambulatory Visit (HOSPITAL_COMMUNITY): Payer: 59

## 2018-10-25 ENCOUNTER — Ambulatory Visit (HOSPITAL_COMMUNITY): Payer: 59

## 2018-10-26 ENCOUNTER — Other Ambulatory Visit (HOSPITAL_COMMUNITY): Payer: Self-pay

## 2018-10-26 MED ORDER — LOSARTAN POTASSIUM 25 MG PO TABS: 25.0000 mg | ORAL_TABLET | Freq: Every day | ORAL | 3 refills | Status: DC

## 2018-10-27 ENCOUNTER — Ambulatory Visit (HOSPITAL_COMMUNITY): Payer: 59

## 2018-12-15 ENCOUNTER — Other Ambulatory Visit (HOSPITAL_COMMUNITY): Payer: Self-pay

## 2018-12-15 MED ORDER — EZETIMIBE 10 MG PO TABS: 10.0000 mg | ORAL_TABLET | Freq: Every day | ORAL | 3 refills | Status: DC

## 2018-12-17 ENCOUNTER — Other Ambulatory Visit: Payer: Self-pay | Admitting: General Surgery

## 2019-03-07 NOTE — Pre-Procedure Instructions (Addendum)
Jerry Montoya  03/07/2019     Your procedure is scheduled on Monday, August 24.  Report to [facility], Main Entrance or Entrance "A" at 5:30 AM                   Your surgery or procedure is scheduled for 7:30 AM   Call this number if you have problems the morning of surgery: [phone]  This is the number for the Pre- Surgical Desk.                 For any other questions, please call [phone], Monday - Friday 8 AM - 4 PM.   Remember:  Do not  after midnight.  You may drink clear liquids until 4:30  Clear liquids allowed are:   Water, Juice (non-citric and without pulp), Carbonated beverages, Clear Tea, Black Coffee only, Plain Jell-O only, Gatorade and Plain Popsicles only  Drink Pre- Surgery Ensure between 4:00 AM and 4:30 AM  Take these medicines the morning of surgery with A SIP OF WATER :  bisoprolol (ZEBETA)              ezetimibe (ZETIA)             pantoprazole (PROTONIX)  Take/Use if needed: acetaminophen (TYLENOL) levalbuterol (XOPENEX HFA)  Aspirin, Plavix hold or continue per surgeon.   STOP taking Aspirin, Aspirin Products (Goody Powder, Excedrin Migraine), Ibuprofen (Advil), Naproxen (Aleve), Vitamins and Herbal Products (ie Fish Oil)    Special instructions:   - Preparing For Surgery  Before surgery, you can play an important role. Because skin is not sterile, your skin needs to be as free of germs as possible. You can reduce the number of germs on your skin by washing with CHG (chlorahexidine gluconate) Soap before surgery.  CHG is an antiseptic cleaner which kills germs and bonds with the skin to continue killing germs even after washing.    Oral Hygiene is also important to reduce your risk of infection.  Remember - BRUSH YOUR TEETH THE MORNING OF SURGERY WITH YOUR REGULAR TOOTHPASTE  Please do not use if you have an allergy to CHG or antibacterial soaps. If your skin becomes reddened/irritated stop using the CHG.  Do not shave  (including legs and underarms) for at least 48 hours prior to first CHG shower. It is OK to shave your face.  Please follow these instructions carefully.   1. Shower the NIGHT BEFORE SURGERY and the MORNING OF SURGERY with CHG.   2. If you chose to wash your hair, wash your hair first as usual with your normal shampoo.  3. After you shampoo, wash your face and private area with the soap you use at home, then rinse your hair and body thoroughly to remove the shampoo and soap.  4. Use CHG as you would any other liquid soap. You can apply CHG directly to the skin and wash gently with a scrungie or a clean washcloth.   5. Apply the CHG Soap to your body ONLY FROM THE NECK DOWN.  Do not use on open wounds or open sores. Avoid contact with your eyes, ears, mouth and genitals (private parts).   6. Wash thoroughly, paying special attention to the area where your surgery will be performed.  7. Thoroughly rinse your body with warm water from the neck down.  8. DO NOT shower/wash with your normal soap after using and rinsing off the CHG Soap.  9. Pat yourself  dry with a CLEAN TOWEL.  10. Wear CLEAN PAJAMAS to bed the night before surgery, wear comfortable clothes the morning of surgery  11. Place CLEAN SHEETS on your bed the night of your first shower and DO NOT SLEEP WITH PETS.  Day of Surgery: Shower as instructed above.  Do not wear lotions, powders, or perfumes, or deodorant. Please wear clean clothes to the hospital/surgery center.   Remember to brush your teeth WITH YOUR REGULAR TOOTHPASTE.             Do not wear jewelry, make-up or nail polish.   Men may shave face and neck.  Do not bring valuables to the hospital.  [facility] is not responsible for any belongings or valuables.  Contacts, dentures or bridgework may not be worn into surgery.  Leave your suitcase in the car.  After surgery it may be brought to your room.  For patients admitted to the hospital, discharge time will be  determined by your treatment team.  Patients discharged the day of surgery will not be allowed to drive home.   Please read over the following fact sheets that you were given: Pain Booklet, Coughing and Deep Breathing,Surgical Site Infections.

## 2019-03-08 ENCOUNTER — Encounter (HOSPITAL_COMMUNITY): Payer: Self-pay

## 2019-03-08 ENCOUNTER — Other Ambulatory Visit: Payer: Self-pay

## 2019-03-08 ENCOUNTER — Encounter (HOSPITAL_COMMUNITY)
Admission: RE | Admit: 2019-03-08 | Discharge: 2019-03-08 | Disposition: A | Discharge status: Home / Self Care | Payer: 59 | Source: Ambulatory Visit | Attending: General Surgery | Admitting: General Surgery

## 2019-03-08 DIAGNOSIS — Z01812 Encounter for preprocedural laboratory examination: Secondary | ICD-10-CM | POA: Diagnosis not present

## 2019-03-08 DIAGNOSIS — Z20828 Contact with and (suspected) exposure to other viral communicable diseases: Secondary | ICD-10-CM | POA: Insufficient documentation

## 2019-03-08 HISTORY — DX: Other complications of anesthesia, initial encounter: T88.59XA

## 2019-03-08 LAB — BASIC METABOLIC PANEL
Anion gap: 10 (ref 5–15)
BUN: 16 mg/dL (ref 8–23)
CO2: 23 mmol/L (ref 22–32)
Calcium: 9.8 mg/dL (ref 8.9–10.3)
Chloride: 106 mmol/L (ref 98–111)
Creatinine, Ser: 0.93 mg/dL (ref 0.61–1.24)
GFR calc Af Amer: >60 mL/min (ref >60)
GFR calc non Af Amer: >60 mL/min (ref >60)
Glucose, Bld: 106 mg/dL — ABNORMAL HIGH (ref 70–99)
Potassium: 4.8 mmol/L (ref 3.5–5.1)
Sodium: 139 mmol/L (ref 135–145)

## 2019-03-08 LAB — CBC
HCT: 46.6 % (ref 39.0–52.0)
Hemoglobin: 16.2 g/dL (ref 13.0–17.0)
MCH: 31.7 pg (ref 26.0–34.0)
MCHC: 34.8 g/dL (ref 30.0–36.0)
MCV: 91.2 fL (ref 80.0–100.0)
Platelets: 388 10*3/uL (ref 150–400)
RBC: 5.11 MIL/uL (ref 4.22–5.81)
RDW: 12.8 % (ref 11.5–15.5)
WBC: 5.2 10*3/uL (ref 4.0–10.5)
nRBC: 0 % (ref 0.0–0.2)

## 2019-03-08 NOTE — Progress Notes (Signed)
PCP - Dr Lajean Manes  Cardiologist - Dr Aundra Dubin  Chest x-ray -   EKG - 05/19/2018  Stress Test - 2017  ECHO - 2019  Cardiac Cath - 2019   Sleep Study - no CPAP - no  LABS-CBC, BMP  ASA- takes when he thinks about it, was not instructed to stop.  Plavix- Stopped  03/03/2019  Was instructed to stop a week ahead.  ERAS-yes + Pre Surgery Ensure  HA1C-na Fasting Blood Sugar - na Checks Blood Sugar ___na__ times a day  Anesthesia-Dr Aundra Dubin is aware that he is having surgery.  I did not see a clearance note.  Pt denies having chest pain, sob, or fever at this time. All instructions explained to the pt, with a verbal understanding of the material. Pt agrees to go over the instructions while at home for a better understanding. Pt also instructed to self quarantine after being tested for COVID-19. The opportunity to ask questions was provided.

## 2019-03-09 NOTE — Anesthesia Preprocedure Evaluation (Addendum)
Anesthesia Evaluation  Patient identified by MRN, date of birth, ID band Patient awake    Reviewed: Allergy & Precautions, NPO status , Patient's Chart, lab work & pertinent test results, reviewed documented beta blocker date and time   Airway Mallampati: II  TM Distance: >3 FB Neck ROM: Full    Dental  (+) Chipped,    Pulmonary asthma ,    Pulmonary exam normal breath sounds clear to auscultation       Cardiovascular hypertension, Pt. on home beta blockers and Pt. on medications + CAD, + Past MI and + Cardiac Stents  Normal cardiovascular exam Rhythm:Regular Rate:Normal  ECG: SB, rate 50  ECHO: Left ventricle: The cavity size was normal. Systolic function was normal. The estimated ejection fraction was in the range of 55% to 60%. Wall motion was normal; there were no regional wall motion abnormalities. Left ventricular diastolic function parameters were normal. Aortic valve: Trileaflet; mildly thickened, mildly calcified leaflets. Mitral valve: There was mild regurgitation.   Neuro/Psych negative neurological ROS  negative psych ROS   GI/Hepatic Neg liver ROS, GERD  Medicated and Controlled,  Endo/Other  negative endocrine ROS  Renal/GU negative Renal ROS     Musculoskeletal negative musculoskeletal ROS (+)   Abdominal   Peds  Hematology HLD   Anesthesia Other Findings right groin hernia  Reproductive/Obstetrics                           Anesthesia Physical Anesthesia Plan  ASA: III  Anesthesia Plan: General and Regional   Post-op Pain Management: GA combined w/ Regional for post-op pain   Induction: Intravenous  PONV Risk Score and Plan: 3 and Midazolam, Dexamethasone, Ondansetron and Treatment may vary due to age or medical condition  Airway Management Planned: Oral ETT  Additional Equipment:   Intra-op Plan:   Post-operative Plan: Extubation in OR  Informed Consent: I  have reviewed the patients History and Physical, chart, labs and discussed the procedure including the risks, benefits and alternatives for the proposed anesthesia with the patient or authorized representative who has indicated his/her understanding and acceptance.     Dental advisory given  Plan Discussed with: CRNA  Anesthesia Plan Comments: (Follows with cardiology for hx of CAD: s/p anterior STEMI 10/15 with DES to mLAD.  Also had CTO mid LCx with collaterals that was not treated percutaneously.  In 10/19, he had crescendo angina.  Cath with 90% D1 stenosis, had DES to D1.  No further chest pain.   Ischemic cardiomyopathy: EF 45-50% on initial echo, improved to 55-60% in 1/16.  Echo 12/19 with EF 55-60%.   Last seen by Dr. Aundra Dubin 09/07/18 and discussed hernia. Per OV note "Right inguinal hernia: Painful but reducible.  He needs to wait at least 6 months post-PCI to come off Plavix for surgery (5/20).  I am going to refer him to Dr. Donne Hazel with [facility] for evaluation."  TTE 06/25/18: - Left ventricle: The cavity size was normal. Systolic function was   normal. The estimated ejection fraction was in the range of 55%   to 60%. Wall motion was normal; there were no regional wall   motion abnormalities. Left ventricular diastolic function   parameters were normal. - Aortic valve: Trileaflet; mildly thickened, mildly calcified   leaflets. - Mitral valve: There was mild regurgitation.  Cath/PCI 05/19/18:  Ost 1st Diag to 1st Diag lesion is 90% stenosed.  A drug-eluting stent was successfully placed using a STENT RESOLUTE  ONYX 2.5X12.  Post intervention, there is a 0% residual stenosis.   Successful PCI of the first diagonal branch of the LAD, reducing stenosis from 90% down to 0% with TIMI-3 flow both pre-and post.  Recommend uninterrupted dual antiplatelet therapy with Aspirin 81mg  daily and Clopidogrel 75mg  daily for a minimum of 6 months (stable ischemic heart disease - Class I  recommendation).)      Anesthesia Quick Evaluation

## 2019-03-10 ENCOUNTER — Other Ambulatory Visit (HOSPITAL_COMMUNITY)
Admission: RE | Admit: 2019-03-10 | Discharge: 2019-03-10 | Disposition: A | Discharge status: Home / Self Care | Payer: 59 | Source: Ambulatory Visit | Attending: General Surgery | Admitting: General Surgery

## 2019-03-10 DIAGNOSIS — Z01812 Encounter for preprocedural laboratory examination: Secondary | ICD-10-CM | POA: Diagnosis not present

## 2019-03-10 LAB — SARS CORONAVIRUS 2 (TAT 6-24 HRS): SARS Coronavirus 2: NEGATIVE

## 2019-03-14 ENCOUNTER — Encounter (HOSPITAL_COMMUNITY)
Admission: RE | Disposition: A | Discharge status: Home / Self Care | Payer: Self-pay | Source: Home / Self Care | Attending: General Surgery

## 2019-03-14 ENCOUNTER — Ambulatory Visit (HOSPITAL_COMMUNITY): Payer: 59 | Admitting: Certified Registered Nurse Anesthetist

## 2019-03-14 ENCOUNTER — Ambulatory Visit (HOSPITAL_COMMUNITY)
Admission: RE | Admit: 2019-03-14 | Discharge: 2019-03-14 | Disposition: A | Discharge status: Home / Self Care | Payer: 59 | Attending: General Surgery | Admitting: General Surgery

## 2019-03-14 ENCOUNTER — Ambulatory Visit (HOSPITAL_COMMUNITY): Payer: 59 | Admitting: Physician Assistant

## 2019-03-14 ENCOUNTER — Other Ambulatory Visit: Payer: Self-pay

## 2019-03-14 ENCOUNTER — Encounter (HOSPITAL_COMMUNITY): Payer: Self-pay | Admitting: General Practice

## 2019-03-14 DIAGNOSIS — Z79899 Other long term (current) drug therapy: Secondary | ICD-10-CM | POA: Insufficient documentation

## 2019-03-14 DIAGNOSIS — J45909 Unspecified asthma, uncomplicated: Secondary | ICD-10-CM | POA: Insufficient documentation

## 2019-03-14 DIAGNOSIS — E7800 Pure hypercholesterolemia, unspecified: Secondary | ICD-10-CM | POA: Diagnosis not present

## 2019-03-14 DIAGNOSIS — Z7951 Long term (current) use of inhaled steroids: Secondary | ICD-10-CM | POA: Diagnosis not present

## 2019-03-14 DIAGNOSIS — I10 Essential (primary) hypertension: Secondary | ICD-10-CM | POA: Insufficient documentation

## 2019-03-14 DIAGNOSIS — Z955 Presence of coronary angioplasty implant and graft: Secondary | ICD-10-CM | POA: Diagnosis not present

## 2019-03-14 DIAGNOSIS — Z7902 Long term (current) use of antithrombotics/antiplatelets: Secondary | ICD-10-CM | POA: Insufficient documentation

## 2019-03-14 DIAGNOSIS — K219 Gastro-esophageal reflux disease without esophagitis: Secondary | ICD-10-CM | POA: Diagnosis not present

## 2019-03-14 DIAGNOSIS — Z85038 Personal history of other malignant neoplasm of large intestine: Secondary | ICD-10-CM | POA: Diagnosis not present

## 2019-03-14 DIAGNOSIS — Z7982 Long term (current) use of aspirin: Secondary | ICD-10-CM | POA: Diagnosis not present

## 2019-03-14 DIAGNOSIS — I2510 Atherosclerotic heart disease of native coronary artery without angina pectoris: Secondary | ICD-10-CM | POA: Insufficient documentation

## 2019-03-14 DIAGNOSIS — I252 Old myocardial infarction: Secondary | ICD-10-CM | POA: Insufficient documentation

## 2019-03-14 DIAGNOSIS — E785 Hyperlipidemia, unspecified: Secondary | ICD-10-CM | POA: Insufficient documentation

## 2019-03-14 DIAGNOSIS — K4090 Unilateral inguinal hernia, without obstruction or gangrene, not specified as recurrent: Secondary | ICD-10-CM | POA: Diagnosis not present

## 2019-03-14 HISTORY — PX: INGUINAL HERNIA REPAIR: SHX194

## 2019-03-14 HISTORY — PX: INSERTION OF MESH: SHX5868

## 2019-03-14 SURGERY — REPAIR, HERNIA, INGUINAL, ADULT
Anesthesia: Regional | Site: Inguinal | Laterality: Right

## 2019-03-14 MED ORDER — PHENYLEPHRINE 40 MCG/ML (10ML) SYRINGE FOR IV PUSH (FOR BLOOD PRESSURE SUPPORT)
PREFILLED_SYRINGE | INTRAVENOUS | Status: DC | PRN
  Administered 2019-03-14 (×2): 80 ug via INTRAVENOUS
  Administered 2019-03-14: 40 ug via INTRAVENOUS
  Administered 2019-03-14 (×2): 80 ug via INTRAVENOUS
  Administered 2019-03-14 (×2): 40 ug via INTRAVENOUS

## 2019-03-14 MED ORDER — BUPIVACAINE HCL (PF) 0.25 % IJ SOLN
INTRAMUSCULAR | Status: AC
  Filled 2019-03-14: qty 30

## 2019-03-14 MED ORDER — ROPIVACAINE HCL 5 MG/ML IJ SOLN
INTRAMUSCULAR | Status: DC | PRN
  Administered 2019-03-14: 30 mL via PERINEURAL

## 2019-03-14 MED ORDER — ONDANSETRON HCL 4 MG/2ML IJ SOLN
INTRAMUSCULAR | Status: DC | PRN
  Administered 2019-03-14: 4 mg via INTRAVENOUS

## 2019-03-14 MED ORDER — ONDANSETRON HCL 4 MG/2ML IJ SOLN
INTRAMUSCULAR | Status: AC
  Filled 2019-03-14: qty 2

## 2019-03-14 MED ORDER — MIDAZOLAM HCL 2 MG/2ML IJ SOLN
INTRAMUSCULAR | Status: AC
  Filled 2019-03-14: qty 2

## 2019-03-14 MED ORDER — FENTANYL CITRATE (PF) 250 MCG/5ML IJ SOLN
INTRAMUSCULAR | Status: DC | PRN
  Administered 2019-03-14: 50 ug via INTRAVENOUS

## 2019-03-14 MED ORDER — DEXAMETHASONE SODIUM PHOSPHATE 10 MG/ML IJ SOLN
INTRAMUSCULAR | Status: AC
  Filled 2019-03-14: qty 1

## 2019-03-14 MED ORDER — BUPIVACAINE HCL (PF) 0.25 % IJ SOLN
INTRAMUSCULAR | Status: DC | PRN
  Administered 2019-03-14: 4 mL

## 2019-03-14 MED ORDER — ACETAMINOPHEN 500 MG PO TABS
1000.0000 mg | ORAL_TABLET | ORAL | Status: AC
  Administered 2019-03-14: 07:00:00 1000 mg via ORAL

## 2019-03-14 MED ORDER — PHENYLEPHRINE 40 MCG/ML (10ML) SYRINGE FOR IV PUSH (FOR BLOOD PRESSURE SUPPORT)
PREFILLED_SYRINGE | INTRAVENOUS | Status: AC
  Filled 2019-03-14: qty 10

## 2019-03-14 MED ORDER — ACETAMINOPHEN 500 MG PO TABS
ORAL_TABLET | ORAL | Status: AC
  Administered 2019-03-14: 1000 mg via ORAL
  Filled 2019-03-14: qty 2

## 2019-03-14 MED ORDER — LIDOCAINE 2% (20 MG/ML) 5 ML SYRINGE
INTRAMUSCULAR | Status: DC | PRN
  Administered 2019-03-14: 40 mg via INTRAVENOUS

## 2019-03-14 MED ORDER — GABAPENTIN 100 MG PO CAPS
100.0000 mg | ORAL_CAPSULE | ORAL | Status: AC
  Administered 2019-03-14: 07:00:00 100 mg via ORAL

## 2019-03-14 MED ORDER — LIDOCAINE 2% (20 MG/ML) 5 ML SYRINGE
INTRAMUSCULAR | Status: AC
  Filled 2019-03-14: qty 5

## 2019-03-14 MED ORDER — GABAPENTIN 100 MG PO CAPS
ORAL_CAPSULE | ORAL | Status: AC
  Administered 2019-03-14: 07:00:00 100 mg via ORAL
  Filled 2019-03-14: qty 1

## 2019-03-14 MED ORDER — FENTANYL CITRATE (PF) 100 MCG/2ML IJ SOLN: 25.0000 ug | INTRAMUSCULAR | Status: DC | PRN

## 2019-03-14 MED ORDER — BISOPROLOL FUMARATE 5 MG PO TABS
5.0000 mg | ORAL_TABLET | Freq: Once | ORAL | Status: AC
  Administered 2019-03-14: 5 mg via ORAL
  Filled 2019-03-14: qty 1

## 2019-03-14 MED ORDER — STERILE WATER FOR IRRIGATION IR SOLN
Status: DC | PRN
  Administered 2019-03-14: 1000 mL

## 2019-03-14 MED ORDER — LIDOCAINE 2% (20 MG/ML) 5 ML SYRINGE
INTRAMUSCULAR | Status: AC
  Filled 2019-03-14: qty 10

## 2019-03-14 MED ORDER — ONDANSETRON HCL 4 MG/2ML IJ SOLN
INTRAMUSCULAR | Status: AC
  Filled 2019-03-14: qty 4

## 2019-03-14 MED ORDER — FENTANYL CITRATE (PF) 250 MCG/5ML IJ SOLN
INTRAMUSCULAR | Status: AC
  Filled 2019-03-14: qty 5

## 2019-03-14 MED ORDER — LACTATED RINGERS IV SOLN
INTRAVENOUS | Status: DC | PRN
  Administered 2019-03-14: 07:00:00 via INTRAVENOUS

## 2019-03-14 MED ORDER — ONDANSETRON HCL 4 MG/2ML IJ SOLN: 4.0000 mg | Freq: Once | INTRAMUSCULAR | Status: DC | PRN

## 2019-03-14 MED ORDER — 0.9 % SODIUM CHLORIDE (POUR BTL) OPTIME
TOPICAL | Status: DC | PRN
  Administered 2019-03-14: 1000 mL

## 2019-03-14 MED ORDER — CEFAZOLIN SODIUM-DEXTROSE 2-4 GM/100ML-% IV SOLN
INTRAVENOUS | Status: AC
  Filled 2019-03-14: qty 100

## 2019-03-14 MED ORDER — DEXAMETHASONE SODIUM PHOSPHATE 10 MG/ML IJ SOLN
INTRAMUSCULAR | Status: DC | PRN
  Administered 2019-03-14: 10 mg via INTRAVENOUS

## 2019-03-14 MED ORDER — MIDAZOLAM HCL 2 MG/2ML IJ SOLN
INTRAMUSCULAR | Status: DC | PRN
  Administered 2019-03-14 (×2): 1 mg via INTRAVENOUS

## 2019-03-14 MED ORDER — TRAMADOL HCL 50 MG PO TABS: 100.0000 mg | ORAL_TABLET | Freq: Four times a day (QID) | ORAL | 0 refills | Status: DC | PRN

## 2019-03-14 MED ORDER — PROPOFOL 10 MG/ML IV BOLUS
INTRAVENOUS | Status: AC
  Filled 2019-03-14: qty 40

## 2019-03-14 MED ORDER — PROPOFOL 10 MG/ML IV BOLUS
INTRAVENOUS | Status: DC | PRN
  Administered 2019-03-14: 200 mg via INTRAVENOUS

## 2019-03-14 MED ORDER — CEFAZOLIN SODIUM-DEXTROSE 2-4 GM/100ML-% IV SOLN
2.0000 g | INTRAVENOUS | Status: AC
  Administered 2019-03-14: 08:00:00 2 g via INTRAVENOUS

## 2019-03-14 SURGICAL SUPPLY — 41 items
ADH SKN CLS APL DERMABOND .7 (GAUZE/BANDAGES/DRESSINGS) ×1
APL PRP STRL LF DISP 70% ISPRP (MISCELLANEOUS) ×1
BLADE CLIPPER SURG (BLADE) ×2 IMPLANT
CANISTER SUCT 3000ML PPV (MISCELLANEOUS) ×2 IMPLANT
CHLORAPREP W/TINT 26 (MISCELLANEOUS) ×3 IMPLANT
COVER SURGICAL LIGHT HANDLE (MISCELLANEOUS) ×3 IMPLANT
DERMABOND ADVANCED (GAUZE/BANDAGES/DRESSINGS) ×2
DERMABOND ADVANCED .7 DNX12 (GAUZE/BANDAGES/DRESSINGS) ×1 IMPLANT
DRAIN PENROSE 1/2X12 LTX STRL (WOUND CARE) ×2 IMPLANT
DRAPE LAPAROTOMY TRNSV 102X78 (DRAPES) ×3 IMPLANT
ELECT CAUTERY BLADE 6.4 (BLADE) ×3 IMPLANT
ELECT REM PT RETURN 9FT ADLT (ELECTROSURGICAL) ×3
ELECTRODE REM PT RTRN 9FT ADLT (ELECTROSURGICAL) ×1 IMPLANT
GAUZE 4X4 16PLY RFD (DISPOSABLE) ×3 IMPLANT
GLOVE BIO SURGEON STRL SZ7 (GLOVE) ×3 IMPLANT
GLOVE BIOGEL PI IND STRL 6.5 (GLOVE) IMPLANT
GLOVE BIOGEL PI IND STRL 7.5 (GLOVE) ×1 IMPLANT
GLOVE BIOGEL PI INDICATOR 6.5 (GLOVE) ×2
GLOVE BIOGEL PI INDICATOR 7.5 (GLOVE) ×2
GOWN STRL REUS W/ TWL LRG LVL3 (GOWN DISPOSABLE) ×2 IMPLANT
GOWN STRL REUS W/TWL LRG LVL3 (GOWN DISPOSABLE) ×6
KIT BASIN OR (CUSTOM PROCEDURE TRAY) ×3 IMPLANT
KIT TURNOVER KIT B (KITS) ×3 IMPLANT
MARKER SKIN DUAL TIP RULER LAB (MISCELLANEOUS) ×3 IMPLANT
MESH ULTRAPRO 3X6 7.6X15CM (Mesh General) ×2 IMPLANT
NDL HYPO 25GX1X1/2 BEV (NEEDLE) ×1 IMPLANT
NEEDLE HYPO 25GX1X1/2 BEV (NEEDLE) ×3 IMPLANT
NS IRRIG 1000ML POUR BTL (IV SOLUTION) ×3 IMPLANT
PACK GENERAL/GYN (CUSTOM PROCEDURE TRAY) ×3 IMPLANT
PAD ARMBOARD 7.5X6 YLW CONV (MISCELLANEOUS) ×5 IMPLANT
PENCIL SMOKE EVACUATOR (MISCELLANEOUS) ×3 IMPLANT
SUT MNCRL AB 4-0 PS2 18 (SUTURE) ×3 IMPLANT
SUT VIC AB 2-0 CT1 27 (SUTURE) ×3
SUT VIC AB 2-0 CT1 TAPERPNT 27 (SUTURE) ×1 IMPLANT
SUT VIC AB 2-0 SH 18 (SUTURE) ×5 IMPLANT
SUT VIC AB 3-0 SH 27 (SUTURE) ×3
SUT VIC AB 3-0 SH 27XBRD (SUTURE) ×1 IMPLANT
SUT VICRYL AB 2 0 TIES (SUTURE) ×3 IMPLANT
SYR CONTROL 10ML LL (SYRINGE) ×3 IMPLANT
TOWEL GREEN STERILE (TOWEL DISPOSABLE) ×3 IMPLANT
WATER STERILE IRR 1000ML POUR (IV SOLUTION) ×2 IMPLANT

## 2019-03-14 NOTE — Op Note (Signed)
Preoperative diagnosis: right inguinal hernia  Postoperative diagnosis: direct right inguinal hernia Procedure: Right inguinal hernia repair with Ultrapro mesh patch Surgeon: Dr Serita Grammes Anesthesia: general with TAP block EBL: minimal Complications none Drains none Specimens none Sponge and needle count correct at completion dispo to recovery stable  Indications: This is a 52 yom who has a symptomatic right inguinal hernia. He desires repair. I think this is reasonable and have discussed it with cardiology.   Procedure: After informed consent was obtained patient was taken to the operating room. He had undergone a TAP block.  He was given antibiotics and SCDs were in place.  He was placed under general anesthesia without complication. He was prepped and draped in standard sterile surgical fashion.  I infiltrated marcaine in the right groin.  I made an incision. I ligated the superficial epigastric vein with 3-0 vicryl.  I then entered the external oblique through the external ring.  I encircled the spermatic cord with a penrose drain. There was no indirect hernia.  He had a large direct hernia.  I then closed this primarily with 2-0 vicryl from the internal oblique.  I then fashioned an ultrapro patch to fit the area. I sutured this to the pubic tubercle with 2-0 vicryl and then to the shelving edge inferiorly.  I made a cut and wrapped around spermatic cord. I then sutured the cut ends together and secured the top portion of the mesh. I laid the lateral ends under the external oblique. The mesh was in good position and completely obliterated the defect.  Hemostasis was observed. I closed the external oblique with 2-0 vicryl and Scarpas fascia with 3-0 vicryl. The skin was closed with 4-0 monocryl and dermabond. He tolerated this well, was extubated and transferred to recovery stable.

## 2019-03-14 NOTE — Anesthesia Procedure Notes (Signed)
Anesthesia Regional Block: TAP block   Pre-Anesthetic Checklist: ,, timeout performed, Correct Patient, Correct Site, Correct Laterality, Correct Procedure,, site marked, risks and benefits discussed, Surgical consent,  Pre-op evaluation,  At surgeon's request and post-op pain management  Laterality: Right  Prep: chloraprep       Needles:  Injection technique: Single-shot  Needle Type: Echogenic Stimulator Needle     Needle Length: 10cm  Needle Gauge: 21     Additional Needles:   Procedures:,,,, ultrasound used (permanent image in chart),,,,  Narrative:  Start time: 03/14/2019 9:10 AM End time: 03/14/2019 9:20 AM Injection made incrementally with aspirations every 5 mL.  Performed by: Personally  Anesthesiologist: Murvin Natal, MD  Additional Notes: Functioning IV was confirmed and monitors were applied.  A 171mm 21ga Pajunk echogenic stimulator needle was used. Sterile prep and drape,hand hygiene and sterile gloves were used.  Negative aspiration and negative test dose prior to incremental administration of local anesthetic. The patient tolerated the procedure well.

## 2019-03-14 NOTE — Anesthesia Postprocedure Evaluation (Signed)
Anesthesia Post Note  Patient: Jerry Montoya  Procedure(s) Performed: RIGHT INGUINAL HERNIA REPAIR WITH MESH (Right Inguinal) Insertion Of Mesh (Right Inguinal)     Patient location during evaluation: PACU Anesthesia Type: Regional and General Level of consciousness: awake and alert Pain management: pain level controlled Vital Signs Assessment: post-procedure vital signs reviewed and stable Respiratory status: spontaneous breathing, nonlabored ventilation, respiratory function stable and patient connected to nasal cannula oxygen Cardiovascular status: blood pressure returned to baseline and stable Postop Assessment: no apparent nausea or vomiting Anesthetic complications: no    Last Vitals:  Vitals:   03/14/19 0853 03/14/19 0901  BP: 128/90   Pulse: 62   Resp: 11   Temp:  (!) 36.1 C  SpO2: 95%     Last Pain:  Vitals:   03/14/19 0901  TempSrc:   PainSc: 0-No pain                 [name] P Mily Malecki

## 2019-03-14 NOTE — Transfer of Care (Signed)
Immediate Anesthesia Transfer of Care Note  Patient: Jerry Montoya  Procedure(s) Performed: RIGHT INGUINAL HERNIA REPAIR WITH MESH (Right Inguinal) Insertion Of Mesh (Right Inguinal)  Patient Location: PACU  Anesthesia Type:General and Regional  Level of Consciousness: patient cooperative and responds to stimulation  Airway & Oxygen Therapy: Patient Spontanous Breathing  Post-op Assessment: Report given to RN and Post -op Vital signs reviewed and stable  Post vital signs: Reviewed and stable  Last Vitals:  Vitals Value Taken Time  BP 128/87 03/14/19 0838  Temp    Pulse 72 03/14/19 0840  Resp 13 03/14/19 0840  SpO2 97 % 03/14/19 0840  Vitals shown include unvalidated device data.  Last Pain:  Vitals:   03/14/19 0640  TempSrc:   PainSc: 0-No pain      Patients Stated Pain Goal: 3 ([id number])  Complications: No apparent anesthesia complications

## 2019-03-14 NOTE — Discharge Instructions (Signed)
[facility] ° °UMBILICAL OR INGUINAL HERNIA REPAIR: POST OP INSTRUCTIONS ° °Always review your discharge instruction sheet given to you by the facility where your surgery was performed. °IF YOU HAVE DISABILITY OR FAMILY LEAVE FORMS, YOU MUST BRING THEM TO THE OFFICE FOR PROCESSING.   °DO NOT GIVE THEM TO YOUR DOCTOR. ° °1. A  prescription for pain medication may be given to you upon discharge.  Take your pain medication as prescribed, if needed.  If narcotic pain medicine is not needed, then you may take acetaminophen (Tylenol), naprosyn (Alleve) or ibuprofen (Advil) as needed. °2. Take your usually prescribed medications unless otherwise directed. °3. If you need a refill on your pain medication, please contact your pharmacy.  They will contact our office to request authorization. Prescriptions will not be filled after 5 pm or on week-ends. °4. You should follow a light diet the first 24 hours after arrival home, such as soup and crackers, etc.  Be sure to include lots of fluids daily.  Resume your normal diet the day after surgery. °5. Most patients will experience some swelling and bruising around the umbilicus or in the groin and scrotum.  Ice packs and reclining will help.  Swelling and bruising can take several days to resolve.  °6. It is common to experience some constipation if taking pain medication after surgery.  Increasing fluid intake and taking a stool softener (such as Colace) will usually help or prevent this problem from occurring.  A mild laxative (Milk of Magnesia or Miralax) should be taken according to package directions if there are no bowel movements after 48 hours. °7. Unless discharge instructions indicate otherwise, you may remove your bandages 48 hours after surgery, and you may shower at that time.  You may have steri-strips (small skin tapes) in place directly over the incision.  These strips should be left on the skin for 7-10 days and will come off on their own.   If your surgeon used skin glue on the incision, you may shower in 24 hours.  The glue will flake off over the next 2-3 weeks.  Any sutures or staples will be removed at the office during your follow-up visit. °8. ACTIVITIES:  You may resume regular (light) daily activities beginning the next day--such as daily self-care, walking, climbing stairs--gradually increasing activities as tolerated.  You may have sexual intercourse when it is comfortable.  Refrain from any heavy lifting or straining until approved by your doctor. °a. You may drive when you are no longer taking prescription pain medication, you can comfortably wear a seatbelt, and you can safely maneuver your car and apply brakes. °b. RETURN TO WORK:  __________________________________________________________ °9. You should see your doctor in the office for a follow-up appointment approximately 2-3 weeks after your surgery.  Make sure that you call for this appointment within a day or two after you arrive home to insure a convenient appointment time. °10. OTHER INSTRUCTIONS:  __________________________________________________________________________________________________________________________________________________________________________________________  °WHEN TO CALL YOUR DOCTOR: °1. Fever over 101.0 °2. Inability to urinate °3. Nausea and/or vomiting °4. Extreme swelling or bruising °5. Continued bleeding from incision. °6. Increased pain, redness, or drainage from the incision ° °The clinic staff is available to answer your questions during regular business hours.  Please don’t hesitate to call and ask to speak to one of the nurses for clinical concerns.  If you have a medical emergency, go to the nearest emergency room or call 911.  A surgeon from [facility]   is always on call at the hospital ° ° °[address], [address], [place], [place]  [address] ? ° [address] [address], [place], [place]   [address] °[phone] ? [phone] ? FAX  [phone] °Web site: [email or web address] ° ° °

## 2019-03-14 NOTE — H&P (Signed)
Jerry Montoya is an 63 y.o. male.   Chief Complaint: right inguinal hernia HPI: 56 yom with multiple medical issues including des placement in October 2019 presents with symptomatic right groin bulge. This is becoming very symptomatic now.  He has to reduce it. No change in bms.    Past Medical History:  Diagnosis Date  . Asthma   . Cancer ([place]) 2015   colon cancer  . Complication of anesthesia    slow to awaken  . Esophageal reflux   . Hypercholesterolemia   . Hypertension   . Myocardial infarction [facility]) 2015, 06/2018    Past Surgical History:  Procedure Laterality Date  . COLONOSCOPY    . CORONARY STENT INTERVENTION N/A 05/19/2018   Procedure: CORONARY STENT INTERVENTION;  Surgeon: Sherren Mocha, MD;  Location: [place] [facility];  Service: Cardiovascular;  Laterality: N/A;  . INTRAVASCULAR PRESSURE WIRE/FFR STUDY N/A 05/19/2018   Procedure: INTRAVASCULAR PRESSURE WIRE/FFR STUDY;  Surgeon: Sherren Mocha, MD;  Location: [place] [facility];  Service: Cardiovascular;  Laterality: N/A;  . LAPAROSCOPIC PARTIAL COLECTOMY N/A 03/23/2014   Procedure: LAPAROSCOPIC ASSISTED sigmoid COLECTOMY with mobilization of spplenic flexure;  Surgeon: Jackolyn Confer, MD;  Location: [facility];  Service: General;  Laterality: N/A;  . LEFT HEART CATH AND CORONARY ANGIOGRAPHY N/A 05/19/2018   Procedure: LEFT HEART CATH AND CORONARY ANGIOGRAPHY;  Surgeon: Larey Dresser, MD;  Location: [place] [facility];  Service: Cardiovascular;  Laterality: N/A;  . LEFT HEART CATHETERIZATION WITH CORONARY ANGIOGRAM  04/30/2014   Procedure: LEFT HEART CATHETERIZATION WITH CORONARY ANGIOGRAM;  Surgeon: Wellington Hampshire, MD;  Location: [place] CATH LAB;  Service: Cardiovascular;;  . stent to heart  04-30-14   x 1  . VASECTOMY     23 years ago    Family History  Problem Relation Age of Onset  . Coronary artery disease Father    Social History:  reports that he has never smoked. He has never used smokeless tobacco. He  reports that he does not drink alcohol or use drugs.  Allergies:  Allergies  Allergen Reactions  . Albuterol Other (See Comments)    Caused heart attack    Medications Prior to Admission  Medication Sig Dispense Refill  . bisoprolol (ZEBETA) 5 MG tablet Take 1 tablet (5 mg total) by mouth daily. 90 tablet 3  . clopidogrel (PLAVIX) 75 MG tablet Take 1 tablet (75 mg total) by mouth daily. 90 tablet 3  . levalbuterol (XOPENEX HFA) 45 MCG/ACT inhaler Inhale 1-2 puffs into the lungs every 6 (six) hours as needed for wheezing. 1 Inhaler 12  . pantoprazole (PROTONIX) 40 MG tablet Take 1 tablet (40 mg total) by mouth daily. (Patient taking differently: Take 40 mg by mouth daily as needed (acid reflux). ) 90 tablet 3  . rosuvastatin (CRESTOR) 40 MG tablet Take 1 tablet (40 mg total) by mouth daily. (Patient taking differently: Take 20 mg by mouth daily. ) 90 tablet 3  . acetaminophen (TYLENOL) 500 MG tablet Take 500 mg by mouth every 8 (eight) hours as needed for mild pain.     [name] Kitchen aspirin EC 81 MG tablet Take 1 tablet (81 mg total) by mouth daily. 90 tablet 3  . EPIPEN 2-PAK 0.3 MG/0.3ML SOAJ injection Inject 0.3 mg into the skin once as needed for anaphylaxis.   0  . ezetimibe (ZETIA) 10 MG tablet Take 1 tablet (10 mg total) by mouth daily. (Patient not taking: Reported on 03/08/2019) 90 tablet 3  .  fluticasone (FLOVENT HFA) 110 MCG/ACT inhaler Inhale 1 puff into the lungs 2 (two) times daily. (Patient not taking: Reported on 03/08/2019) 1 Inhaler 12  . losartan (COZAAR) 25 MG tablet Take 1 tablet (25 mg total) by mouth daily. (Patient not taking: Reported on 03/08/2019) 90 tablet 3  . nitroGLYCERIN (NITROSTAT) 0.4 MG SL tablet Place 1 tablet (0.4 mg total) under the tongue every 5 (five) minutes x 3 doses as needed for chest pain. 25 tablet 5    No results found for this or any previous visit (from the past 48 hour(s)). No results found.  Review of Systems  All other systems reviewed and are  negative.   Blood pressure 127/76, pulse 85, temperature 98.1 F (36.7 C), temperature source Oral, resp. rate 18, height 5\' 10"  (1.778 m), weight 74.8 kg, SpO2 96 %. Physical Exam  Vitals reviewed. Constitutional: He appears well-developed and well-nourished.  Neck: Neck supple.  Cardiovascular: Normal rate, regular rhythm and normal heart sounds.  Respiratory: Effort normal and breath sounds normal.  GI: Soft. There is no abdominal tenderness.  Right inguinal hernia difficult to reduce     Assessment/Plan RIH We discussed observation versus repair.  We discussed both laparoscopic and open inguinal hernia repairs. I described the procedure in detail.  The patient was given educational material.  Goals should be achieved with surgery. We discussed the usage of mesh and the rationale behind that. We went over the pathophysiology of inguinal hernia. We have elected to perform open inguinal hernia repair with mesh.  We discussed the risks including bleeding, infection, recurrence, postoperative pain and chronic groin pain, testicular injury, urinary retention, numbness in groin and around incision. I discussed with his cardiologist also   Rolm Bookbinder, MD 03/14/2019, 7:15 AM

## 2019-03-14 NOTE — Interval H&P Note (Signed)
History and Physical Interval Note:  03/14/2019 7:19 AM  Jerry Montoya  has presented today for surgery, with the diagnosis of right groin hernia.  The various methods of treatment have been discussed with the patient and family. After consideration of risks, benefits and other options for treatment, the patient has consented to  Procedure(s) with comments: [facility] (Right) - TAP BLOCK ANESTHESIA AS WELL as a surgical intervention.  The patient's history has been reviewed, patient examined, no change in status, stable for surgery.  I have reviewed the patient's chart and labs.  Questions were answered to the patient's satisfaction.     Rolm Bookbinder

## 2019-03-15 ENCOUNTER — Encounter (HOSPITAL_COMMUNITY): Payer: Self-pay | Admitting: General Surgery

## 2019-03-31 ENCOUNTER — Other Ambulatory Visit (HOSPITAL_COMMUNITY): Payer: Self-pay | Admitting: Cardiology

## 2019-03-31 DIAGNOSIS — I255 Ischemic cardiomyopathy: Secondary | ICD-10-CM

## 2019-03-31 DIAGNOSIS — I2510 Atherosclerotic heart disease of native coronary artery without angina pectoris: Secondary | ICD-10-CM

## 2019-07-20 ENCOUNTER — Other Ambulatory Visit (HOSPITAL_COMMUNITY): Payer: Self-pay | Admitting: Cardiology

## 2019-08-11 ENCOUNTER — Ambulatory Visit: Payer: 59 | Attending: Internal Medicine

## 2019-08-11 DIAGNOSIS — Z23 Encounter for immunization: Secondary | ICD-10-CM | POA: Insufficient documentation

## 2019-08-11 NOTE — Progress Notes (Signed)
   Covid-19 Vaccination Clinic  Name:  Jerry Montoya    MRN: [id number] DOB: Jun 11, 1956  08/11/2019  Mr. Jerry Montoya was observed post Covid-19 immunization for 15 minutes without incidence. He was provided with Vaccine Information Sheet and instruction to access the V-Safe system.   Mr. Jerry Montoya was instructed to call 911 with any severe reactions post vaccine: [name] Kitchen Difficulty breathing  . Swelling of your face and throat  . A fast heartbeat  . A bad rash all over your body  . Dizziness and weakness    Immunizations Administered    Name Date Dose VIS Date Route   Pfizer COVID-19 Vaccine 08/11/2019 12:54 PM 0.3 mL 07/01/2019 Intramuscular   Manufacturer: [place]   Lot: [id number]   [place]: [id number]

## 2019-09-01 ENCOUNTER — Ambulatory Visit: Payer: 59 | Attending: Internal Medicine

## 2019-09-01 DIAGNOSIS — Z23 Encounter for immunization: Secondary | ICD-10-CM | POA: Insufficient documentation

## 2019-09-01 NOTE — Progress Notes (Signed)
   Covid-19 Vaccination Clinic  Name:  Jerry Montoya    MRN: [id number] DOB: 24-Sep-1955  09/01/2019  [name] was observed post Covid-19 immunization for 15 minutes without incidence. He was provided with Vaccine Information Sheet and instruction to access the V-Safe system.   [name] was instructed to call 911 with any severe reactions post vaccine: [name] Kitchen Difficulty breathing  . Swelling of your face and throat  . A fast heartbeat  . A bad rash all over your body  . Dizziness and weakness    Immunizations Administered    Name Date Dose VIS Date Route   Pfizer COVID-19 Vaccine 09/01/2019  1:25 PM 0.3 mL 07/01/2019 Intramuscular   Manufacturer: [place]   Lot: [id number]   [place]: [id number]

## 2020-01-13 ENCOUNTER — Telehealth (HOSPITAL_COMMUNITY): Payer: Self-pay

## 2020-01-13 NOTE — Telephone Encounter (Signed)
Received a medical clearance form from [facility] to clear patient for a dental procedure. clearance was faxed successfully to [phone] which was listed on the form. Form will be scanned into patients chart

## 2020-02-13 DIAGNOSIS — H9202 Otalgia, left ear: Secondary | ICD-10-CM | POA: Diagnosis not present

## 2020-02-13 DIAGNOSIS — J4540 Moderate persistent asthma, uncomplicated: Secondary | ICD-10-CM | POA: Diagnosis not present

## 2020-03-09 DIAGNOSIS — J342 Deviated nasal septum: Secondary | ICD-10-CM | POA: Diagnosis not present

## 2020-03-09 DIAGNOSIS — M26622 Arthralgia of left temporomandibular joint: Secondary | ICD-10-CM | POA: Diagnosis not present

## 2020-03-09 DIAGNOSIS — J343 Hypertrophy of nasal turbinates: Secondary | ICD-10-CM | POA: Diagnosis not present

## 2020-03-09 DIAGNOSIS — H9193 Unspecified hearing loss, bilateral: Secondary | ICD-10-CM | POA: Diagnosis not present

## 2020-03-29 DIAGNOSIS — R1310 Dysphagia, unspecified: Secondary | ICD-10-CM | POA: Diagnosis not present

## 2020-03-29 DIAGNOSIS — R194 Change in bowel habit: Secondary | ICD-10-CM | POA: Diagnosis not present

## 2020-03-29 DIAGNOSIS — Z7901 Long term (current) use of anticoagulants: Secondary | ICD-10-CM | POA: Diagnosis not present

## 2020-04-02 ENCOUNTER — Telehealth (HOSPITAL_COMMUNITY): Payer: Self-pay | Admitting: Cardiology

## 2020-04-02 NOTE — Telephone Encounter (Signed)
Medical clearance completed by Dr Aundra Dubin Patient is approved to proceed with endoscopy/colonoscopy    from a cardiac standpoint on 04/24/20. Medication prep-hold plavix 5 days prior to procedure  Faxed to [facility] Attn Dr Paulita Fujita  Fax [phone]

## 2020-04-19 DIAGNOSIS — Z1159 Encounter for screening for other viral diseases: Secondary | ICD-10-CM | POA: Diagnosis not present

## 2020-04-24 DIAGNOSIS — K5730 Diverticulosis of large intestine without perforation or abscess without bleeding: Secondary | ICD-10-CM | POA: Diagnosis not present

## 2020-04-24 DIAGNOSIS — Z85038 Personal history of other malignant neoplasm of large intestine: Secondary | ICD-10-CM | POA: Diagnosis not present

## 2020-04-24 DIAGNOSIS — R1310 Dysphagia, unspecified: Secondary | ICD-10-CM | POA: Diagnosis not present

## 2020-04-24 DIAGNOSIS — K222 Esophageal obstruction: Secondary | ICD-10-CM | POA: Diagnosis not present

## 2020-04-30 DIAGNOSIS — H903 Sensorineural hearing loss, bilateral: Secondary | ICD-10-CM | POA: Diagnosis not present

## 2020-05-29 DIAGNOSIS — J4540 Moderate persistent asthma, uncomplicated: Secondary | ICD-10-CM | POA: Diagnosis not present

## 2020-05-29 DIAGNOSIS — Z1389 Encounter for screening for other disorder: Secondary | ICD-10-CM | POA: Diagnosis not present

## 2020-05-29 DIAGNOSIS — E7800 Pure hypercholesterolemia, unspecified: Secondary | ICD-10-CM | POA: Diagnosis not present

## 2020-05-29 DIAGNOSIS — R0600 Dyspnea, unspecified: Secondary | ICD-10-CM | POA: Diagnosis not present

## 2020-05-29 DIAGNOSIS — Z0000 Encounter for general adult medical examination without abnormal findings: Secondary | ICD-10-CM | POA: Diagnosis not present

## 2020-05-29 DIAGNOSIS — K219 Gastro-esophageal reflux disease without esophagitis: Secondary | ICD-10-CM | POA: Diagnosis not present

## 2020-05-29 DIAGNOSIS — Z79899 Other long term (current) drug therapy: Secondary | ICD-10-CM | POA: Diagnosis not present

## 2020-05-29 DIAGNOSIS — R1319 Other dysphagia: Secondary | ICD-10-CM | POA: Diagnosis not present

## 2020-05-29 DIAGNOSIS — Z85038 Personal history of other malignant neoplasm of large intestine: Secondary | ICD-10-CM | POA: Diagnosis not present

## 2020-05-29 DIAGNOSIS — Z23 Encounter for immunization: Secondary | ICD-10-CM | POA: Diagnosis not present

## 2020-06-08 ENCOUNTER — Other Ambulatory Visit (HOSPITAL_COMMUNITY): Payer: Self-pay | Admitting: Cardiology

## 2020-06-18 ENCOUNTER — Ambulatory Visit (HOSPITAL_COMMUNITY)
Admission: RE | Admit: 2020-06-18 | Discharge: 2020-06-18 | Disposition: A | Discharge status: Home / Self Care | Payer: Medicare HMO | Source: Ambulatory Visit | Attending: Cardiology | Admitting: Cardiology

## 2020-06-18 ENCOUNTER — Encounter (HOSPITAL_COMMUNITY): Payer: Self-pay | Admitting: Cardiology

## 2020-06-18 ENCOUNTER — Other Ambulatory Visit: Payer: Self-pay

## 2020-06-18 ENCOUNTER — Encounter (HOSPITAL_COMMUNITY): Payer: Self-pay

## 2020-06-18 VITALS — BP 150/80 | HR 87 | Wt 174.0 lb

## 2020-06-18 DIAGNOSIS — I25110 Atherosclerotic heart disease of native coronary artery with unstable angina pectoris: Secondary | ICD-10-CM | POA: Insufficient documentation

## 2020-06-18 DIAGNOSIS — J45901 Unspecified asthma with (acute) exacerbation: Secondary | ICD-10-CM | POA: Diagnosis not present

## 2020-06-18 DIAGNOSIS — R079 Chest pain, unspecified: Secondary | ICD-10-CM

## 2020-06-18 DIAGNOSIS — Z7982 Long term (current) use of aspirin: Secondary | ICD-10-CM | POA: Insufficient documentation

## 2020-06-18 DIAGNOSIS — Z79899 Other long term (current) drug therapy: Secondary | ICD-10-CM | POA: Diagnosis not present

## 2020-06-18 DIAGNOSIS — I252 Old myocardial infarction: Secondary | ICD-10-CM | POA: Diagnosis not present

## 2020-06-18 DIAGNOSIS — I255 Ischemic cardiomyopathy: Secondary | ICD-10-CM | POA: Diagnosis not present

## 2020-06-18 DIAGNOSIS — I2510 Atherosclerotic heart disease of native coronary artery without angina pectoris: Secondary | ICD-10-CM

## 2020-06-18 DIAGNOSIS — Z955 Presence of coronary angioplasty implant and graft: Secondary | ICD-10-CM | POA: Diagnosis not present

## 2020-06-18 DIAGNOSIS — Z8249 Family history of ischemic heart disease and other diseases of the circulatory system: Secondary | ICD-10-CM | POA: Insufficient documentation

## 2020-06-18 DIAGNOSIS — I25119 Atherosclerotic heart disease of native coronary artery with unspecified angina pectoris: Secondary | ICD-10-CM | POA: Diagnosis not present

## 2020-06-18 DIAGNOSIS — I10 Essential (primary) hypertension: Secondary | ICD-10-CM | POA: Insufficient documentation

## 2020-06-18 DIAGNOSIS — Z7902 Long term (current) use of antithrombotics/antiplatelets: Secondary | ICD-10-CM | POA: Insufficient documentation

## 2020-06-18 DIAGNOSIS — K219 Gastro-esophageal reflux disease without esophagitis: Secondary | ICD-10-CM | POA: Diagnosis not present

## 2020-06-18 DIAGNOSIS — E785 Hyperlipidemia, unspecified: Secondary | ICD-10-CM | POA: Diagnosis not present

## 2020-06-18 MED ORDER — CLOPIDOGREL BISULFATE 75 MG PO TABS: 75.0000 mg | ORAL_TABLET | Freq: Every day | ORAL | 3 refills | Status: DC

## 2020-06-18 MED ORDER — LOSARTAN POTASSIUM 50 MG PO TABS: 50.0000 mg | ORAL_TABLET | Freq: Every day | ORAL | 3 refills | Status: DC

## 2020-06-18 MED ORDER — ASPIRIN EC 81 MG PO TBEC: 81.0000 mg | DELAYED_RELEASE_TABLET | Freq: Every day | ORAL | 11 refills | Status: DC

## 2020-06-18 NOTE — Patient Instructions (Signed)
STOP Bisoprolol  INCREASE Crestor to 40mg  daily  INCREASE Losartan to 50mg  daily  TAKE Plavix 75mg  daily  TAKE Aspirin 81mg  daily  Lab work in 10 days  Your provider has referred you to Pulmonology they will contact you to schedule an appointment  Your provider requests you have a Myoview  (they will contact you to schedule appointment)  Follow up and echo in 1 month

## 2020-06-18 NOTE — Progress Notes (Signed)
Patient ID: Jerry Montoya, male   DOB: May 30, 1956, 64 y.o.   MRN: [id number] PCP: Dr. Felipa Eth Cardiology: Dr. Aundra Dubin  64 y.o. with history of asthma and colon cancer was admitted in 10/15 with anterior STEMI.  He had fresh total occlusion of the mid LAD and chronic total occlusion of the mid LCx with collaterals.  He had Xience DES to LAD.  Echo that admission showed EF 45-50%.  Repeat echo in 1/16 showed EF up to 55-60%.  In 5/17, he had a Cardiolite that was low risk with a fixed apical inferior defect and no ischemia. In 10/19, he had LHC due to angina showing CTO proximal LCx with collaterals, 60% pLAD, 90% D1 stenosis.  He had DES to D1.   Patient had right inguinal hernia repair in 8/20.   Patient returns for followup of CAD.  Weight is up 9 lbs.  BP is elevated.  He is not taking bisoprolol regularly. He is only taking "10-20" mg of Crestor and does not seem to take this regularly either.  He is not taking aspirin.  He is not taking Plavix regularly.  He says that he is taking losartan. For about 3-4 months, he has had increased exertional dyspnea.  He gets short of breath climbing stairs.  He is not noting dyspnea walking on flat ground.  He will walk up to 2 miles/day with his dog. He does have asthma and notes wheezing at times as well.  In fact, today, he is quite wheezy on exam.  He has intermittent left-sided chest tightness that is not related to exertion.  He has noticed this for about 2 months.   ECG (personally reviewed): NSR, LVH   REDS clip 32%  Labs (10/15): K 3.9, creatinine 0.81 => 1.0, LDL 129 Labs (10/16): K 3.9, creatinine 0.89, LDL 125, HDL 30 Labs (5/17): LDL 77, HDl 31, K 4.2, creatinine 0.9 Labs (10/18): LDL 82 Labs (6/19): LDL 82, HDL 34, K 4, creatinine 0.84 Labs (11/19): K 3.9, creatinine 0.77 Labs (12/19): LDL 59, HDL 41 Labs (11/21): LDL 127, TGs 204, K 3.8, creatinine 0.87  PMH: 1. Asthma: since his 19s.  2. Colon cancer: s/p sigmoid colectomy in 2015.   3. Hyperlipidemia 4. GERD 5. CAD: Anterior STEMI in 10/15, LHC showed total occlusion mid LAD and chronic total occlusion mid LCx with collaterals.  He had Xience DES to the LAD.   - Cardiolite (5/17) with fixed apical inferior defect, no ischemia.  Low risk study.  EF 58%.  - LHC (10/19) done for angina: CTO proximal LCx with collaterals, 60% pLAD, 90% D1 stenosis.  He had DES to D1.  6. Ischemic cardiomyopathy: Echo (10/15) with EF 45-50%, anterior/anteroseptal/apical severe hypokinesis.  Echo (1/16) with EF 55-60%.  - Echo (12/19): EF 55-60%, mild MR.  7. Right inguinal hernia repair 8/20  SH: [employer], married Jerry Montoya), nonsmoker.   FH: Father with CAD.   ROS: All systems reviewed and negative except as per HPI.   Current Outpatient Medications  Medication Sig Dispense Refill  . acetaminophen (TYLENOL) 500 MG tablet Take 500 mg by mouth every 8 (eight) hours as needed for mild pain.     [name] Kitchen clopidogrel (PLAVIX) 75 MG tablet Take 1 tablet (75 mg total) by mouth daily. 90 tablet 3  . EPIPEN 2-PAK 0.3 MG/0.3ML SOAJ injection Inject 0.3 mg into the skin once as needed for anaphylaxis.   0  . levalbuterol (XOPENEX HFA) 45 MCG/ACT inhaler Inhale 1-2 puffs into  the lungs every 6 (six) hours as needed for wheezing. 1 Inhaler 12  . losartan (COZAAR) 50 MG tablet Take 1 tablet (50 mg total) by mouth daily. 30 tablet 3  . nitroGLYCERIN (NITROSTAT) 0.4 MG SL tablet DISSOLVE ONE TABLET UNDER THE TONGUE EVERY 5 MINUTES AS NEEDED FOR CHEST PAIN.  DO NOT EXCEED A TOTAL OF 3 DOSES IN 15 MINUTES 25 tablet 0  . pantoprazole (PROTONIX) 40 MG tablet TAKE 1 TABLET BY MOUTH  DAILY 90 tablet 3  . rosuvastatin (CRESTOR) 40 MG tablet TAKE 1 TABLET BY MOUTH  DAILY 90 tablet 3  . traMADol (ULTRAM) 50 MG tablet Take 2 tablets (100 mg total) by mouth every 6 (six) hours as needed. 10 tablet 0  . aspirin EC 81 MG tablet Take 1 tablet (81 mg total) by mouth daily. Swallow whole. 30 tablet 11   No current  facility-administered medications for this encounter.    BP (!) 150/80   Pulse 87   Wt 78.9 kg (174 lb)   SpO2 97%   BMI 24.97 kg/m  General: NAD Neck: No JVD, no thyromegaly or thyroid nodule.  Lungs: Diffuse wheezes bilaterally. CV: Nondisplaced PMI.  Heart regular S1/S2, no S3/S4, no murmur.  No peripheral edema.  No carotid bruit.  Normal pedal pulses.  Abdomen: Soft, nontender, no hepatosplenomegaly, no distention.  Skin: Intact without lesions or rashes.  Neurologic: Alert and oriented x 3.  Psych: Normal affect. Extremities: No clubbing or cyanosis.  HEENT: Normal.   Assessment/Plan: 1. CAD: s/p anterior STEMI 10/15 with DES to mLAD.  Also had CTO mid LCx with collaterals that was not treated percutaneously.  Last Cardiolite in 5/17 was low risk with no ischemia. In 10/19, he had crescendo angina.  Cath with 90% D1 stenosis, had DES to D1.  He is now having progressive exertional dyspnea and atypical chest pain.  It is hard to differentiate a cardiac cause from asthma, he is quite wheezy on exam today.   - He needs to restart ASA 81 mg daily.  - I will refill Plavix 75 mg daily and asked him to take regularly.  - Hold off on bisoprolol for now with active wheezing (he has not been taking it regularly).  - Increase losartan to 50 mg daily with HTN.  - He needs to go back on Crestor 40 mg daily.    - With exertional symptoms, I will arrange for ETT-Cardiolite.  If this is abnormal, he will need cath.  2. Ischemic cardiomyopathy: EF 45-50% on initial echo, improved to 55-60% in 1/16.  Echo 12/19 with EF 55-60%.  Now with exertional dyspnea, NYHA class II symptoms.  He is not volume overloaded by exam or REDS clip. As above, difficult to determine if dyspnea is related to cardiac issues or asthma with diffuse wheezing on exam today.  - Increase losartan to 50 mg daily today with HTN.  - Hold bisoprolol with active wheezing (not taking regularly).  - I will arrange for echo given  dyspnea.  3. Hyperlipidemia: LDL elevated in 11/21 but only taking 10-20 mg daily of Crestor and not taking regularly.  - Increase Crestor back to 40 mg daily, lipids/LFTs in 2 months.   4. HTN: BP elevated, increase losartan as above.    5. Asthma: Active wheezing. This could certainly be the cause of his symptoms if uncontrolled.   - Refer for pulmonary evaluation.  - In the meantime, would followup with PCP in the next week or so  for evaluation.  6. Snoring: Does not want sleep study.   If Cardiolite and echo are normal, followup in 1 month.  If not, will likely need cath.  We discussed the need for medication compliance.   Loralie Champagne 06/18/2020

## 2020-06-28 ENCOUNTER — Ambulatory Visit (HOSPITAL_COMMUNITY)
Admission: RE | Admit: 2020-06-28 | Discharge: 2020-06-28 | Disposition: A | Discharge status: Home / Self Care | Payer: Medicare HMO | Source: Ambulatory Visit | Attending: Internal Medicine | Admitting: Internal Medicine

## 2020-06-28 ENCOUNTER — Other Ambulatory Visit: Payer: Self-pay

## 2020-06-28 DIAGNOSIS — I255 Ischemic cardiomyopathy: Secondary | ICD-10-CM | POA: Diagnosis not present

## 2020-06-28 LAB — BASIC METABOLIC PANEL
Anion gap: 8 (ref 5–15)
BUN: 16 mg/dL (ref 8–23)
CO2: 24 mmol/L (ref 22–32)
Calcium: 9 mg/dL (ref 8.9–10.3)
Chloride: 106 mmol/L (ref 98–111)
Creatinine, Ser: 0.83 mg/dL (ref 0.61–1.24)
GFR, Estimated: >60 mL/min (ref >60)
Glucose, Bld: 148 mg/dL — ABNORMAL HIGH (ref 70–99)
Potassium: 3.5 mmol/L (ref 3.5–5.1)
Sodium: 138 mmol/L (ref 135–145)

## 2020-07-24 ENCOUNTER — Ambulatory Visit (HOSPITAL_BASED_OUTPATIENT_CLINIC_OR_DEPARTMENT_OTHER)
Admission: RE | Admit: 2020-07-24 | Discharge: 2020-07-24 | Disposition: A | Discharge status: Home / Self Care | Payer: Medicare HMO | Source: Ambulatory Visit | Attending: Cardiology | Admitting: Cardiology

## 2020-07-24 ENCOUNTER — Other Ambulatory Visit (HOSPITAL_COMMUNITY): Payer: Self-pay

## 2020-07-24 ENCOUNTER — Encounter (HOSPITAL_COMMUNITY): Payer: Self-pay | Admitting: Cardiology

## 2020-07-24 ENCOUNTER — Other Ambulatory Visit: Payer: Self-pay

## 2020-07-24 ENCOUNTER — Ambulatory Visit (HOSPITAL_COMMUNITY)
Admission: RE | Admit: 2020-07-24 | Discharge: 2020-07-24 | Disposition: A | Discharge status: Home / Self Care | Payer: Medicare HMO | Source: Ambulatory Visit | Attending: Cardiology | Admitting: Cardiology

## 2020-07-24 VITALS — BP 140/80 | HR 76 | Wt 175.2 lb

## 2020-07-24 DIAGNOSIS — J45909 Unspecified asthma, uncomplicated: Secondary | ICD-10-CM | POA: Diagnosis not present

## 2020-07-24 DIAGNOSIS — I252 Old myocardial infarction: Secondary | ICD-10-CM | POA: Diagnosis not present

## 2020-07-24 DIAGNOSIS — E785 Hyperlipidemia, unspecified: Secondary | ICD-10-CM

## 2020-07-24 DIAGNOSIS — Z9049 Acquired absence of other specified parts of digestive tract: Secondary | ICD-10-CM | POA: Insufficient documentation

## 2020-07-24 DIAGNOSIS — Z7902 Long term (current) use of antithrombotics/antiplatelets: Secondary | ICD-10-CM | POA: Diagnosis not present

## 2020-07-24 DIAGNOSIS — Z5329 Procedure and treatment not carried out because of patient's decision for other reasons: Secondary | ICD-10-CM | POA: Insufficient documentation

## 2020-07-24 DIAGNOSIS — I255 Ischemic cardiomyopathy: Secondary | ICD-10-CM

## 2020-07-24 DIAGNOSIS — I25110 Atherosclerotic heart disease of native coronary artery with unstable angina pectoris: Secondary | ICD-10-CM | POA: Diagnosis not present

## 2020-07-24 DIAGNOSIS — Z955 Presence of coronary angioplasty implant and graft: Secondary | ICD-10-CM | POA: Insufficient documentation

## 2020-07-24 DIAGNOSIS — Z7982 Long term (current) use of aspirin: Secondary | ICD-10-CM | POA: Insufficient documentation

## 2020-07-24 DIAGNOSIS — Z85038 Personal history of other malignant neoplasm of large intestine: Secondary | ICD-10-CM | POA: Insufficient documentation

## 2020-07-24 DIAGNOSIS — R0683 Snoring: Secondary | ICD-10-CM | POA: Insufficient documentation

## 2020-07-24 DIAGNOSIS — I10 Essential (primary) hypertension: Secondary | ICD-10-CM | POA: Diagnosis not present

## 2020-07-24 DIAGNOSIS — Z8249 Family history of ischemic heart disease and other diseases of the circulatory system: Secondary | ICD-10-CM | POA: Diagnosis not present

## 2020-07-24 DIAGNOSIS — Z79899 Other long term (current) drug therapy: Secondary | ICD-10-CM | POA: Diagnosis not present

## 2020-07-24 DIAGNOSIS — I5022 Chronic systolic (congestive) heart failure: Secondary | ICD-10-CM

## 2020-07-24 HISTORY — DX: Heart failure, unspecified: I50.9

## 2020-07-24 LAB — CBC
HCT: 42.4 % (ref 39.0–52.0)
Hemoglobin: 14.7 g/dL (ref 13.0–17.0)
MCH: 31.1 pg (ref 26.0–34.0)
MCHC: 34.7 g/dL (ref 30.0–36.0)
MCV: 89.6 fL (ref 80.0–100.0)
Platelets: 156 10*3/uL (ref 150–400)
RBC: 4.73 MIL/uL (ref 4.22–5.81)
RDW: 12.9 % (ref 11.5–15.5)
WBC: 5.5 10*3/uL (ref 4.0–10.5)
nRBC: 0 % (ref 0.0–0.2)

## 2020-07-24 LAB — BASIC METABOLIC PANEL
Anion gap: 11 (ref 5–15)
BUN: 17 mg/dL (ref 8–23)
CO2: 21 mmol/L — ABNORMAL LOW (ref 22–32)
Calcium: 8.9 mg/dL (ref 8.9–10.3)
Chloride: 107 mmol/L (ref 98–111)
Creatinine, Ser: 0.74 mg/dL (ref 0.61–1.24)
GFR, Estimated: >60 mL/min (ref >60)
Glucose, Bld: 88 mg/dL (ref 70–99)
Potassium: 3.7 mmol/L (ref 3.5–5.1)
Sodium: 139 mmol/L (ref 135–145)

## 2020-07-24 LAB — ECHOCARDIOGRAM COMPLETE
Area-P 1/2: 3.17 cm2
S' Lateral: 2.2 cm

## 2020-07-24 MED ORDER — LOSARTAN POTASSIUM 50 MG PO TABS
75.0000 mg | ORAL_TABLET | Freq: Every day | ORAL | 3 refills | Status: DC
Start: 2020-07-24 — End: 2021-09-10

## 2020-07-24 MED ORDER — ISOSORBIDE MONONITRATE ER 30 MG PO TB24: 30.0000 mg | ORAL_TABLET | Freq: Every day | ORAL | 3 refills | Status: DC

## 2020-07-24 NOTE — Progress Notes (Signed)
Patient ID: Jerry Montoya, male   DOB: 08-Mar-1956, 65 y.o.   MRN: [id number] PCP: Dr. Felipa Eth Cardiology: Dr. Aundra Dubin  65 y.o. with history of asthma and colon cancer was admitted in 10/15 with anterior STEMI.  He had fresh total occlusion of the mid LAD and chronic total occlusion of the mid LCx with collaterals.  He had Xience DES to LAD.  Echo that admission showed EF 45-50%.  Repeat echo in 1/16 showed EF up to 55-60%.  In 5/17, he had a Cardiolite that was low risk with a fixed apical inferior defect and no ischemia. In 10/19, he had LHC due to angina showing CTO proximal LCx with collaterals, 60% pLAD, 90% D1 stenosis.  He had DES to D1.   Patient had right inguinal hernia repair in 8/20.   Echo was done today, EF 55-60% with normal wall motion.   Patient returns for followup of CAD.  For about 3-4 months, he has had increased exertional dyspnea.  He gets short of breath climbing stairs and has noted some chest heaviness.  He is not noting dyspnea walking on flat ground.  He is more concerned as symptoms are occurring with any walk up an elevation or if he walks fast.  Dyspnea/heaviness resolves with rest.  He has not taken NTG.  No orthopnea/PND.  He notices occasional wheezing. BP mildly elevated.  After last appointment, I had ordered him to get a Cardiolite.  This never got scheduled, we are not sure why.   Labs (10/15): K 3.9, creatinine 0.81 => 1.0, LDL 129 Labs (10/16): K 3.9, creatinine 0.89, LDL 125, HDL 30 Labs (5/17): LDL 77, HDl 31, K 4.2, creatinine 0.9 Labs (10/18): LDL 82 Labs (6/19): LDL 82, HDL 34, K 4, creatinine 0.84 Labs (11/19): K 3.9, creatinine 0.77 Labs (12/19): LDL 59, HDL 41 Labs (11/21): LDL 127, TGs 204, K 3.8, creatinine 0.87 Labs (12/21): K 3.5, creatinine 0.83  PMH: 1. Asthma: since his 73s.  2. Colon cancer: s/p sigmoid colectomy in 2015.  3. Hyperlipidemia 4. GERD 5. CAD: Anterior STEMI in 10/15, LHC showed total occlusion mid LAD and chronic total  occlusion mid LCx with collaterals.  He had Xience DES to the LAD.   - Cardiolite (5/17) with fixed apical inferior defect, no ischemia.  Low risk study.  EF 58%.  - LHC (10/19) done for angina: CTO proximal LCx with collaterals, 60% pLAD, 90% D1 stenosis.  He had DES to D1.  6. Ischemic cardiomyopathy: Echo (10/15) with EF 45-50%, anterior/anteroseptal/apical severe hypokinesis.  Echo (1/16) with EF 55-60%.  - Echo (12/19): EF 55-60%, mild MR.  - Echo (12/21): EF 55-60% with normal wall motion.  7. Right inguinal hernia repair 8/20  SH: [employer], married Matisyahu Fewkes), nonsmoker.   FH: Father with CAD.   ROS: All systems reviewed and negative except as per HPI.   Current Outpatient Medications  Medication Sig Dispense Refill  . acetaminophen (TYLENOL) 500 MG tablet Take 500 mg by mouth every 8 (eight) hours as needed for mild pain.     [name] Kitchen aspirin EC 81 MG tablet Take 1 tablet (81 mg total) by mouth daily. Swallow whole. 30 tablet 11  . EPIPEN 2-PAK 0.3 MG/0.3ML SOAJ injection Inject 0.3 mg into the skin once as needed for anaphylaxis.   0  . isosorbide mononitrate (IMDUR) 30 MG 24 hr tablet Take 1 tablet (30 mg total) by mouth daily. 30 tablet 3  . levalbuterol (XOPENEX HFA) 45 MCG/ACT inhaler Inhale  1-2 puffs into the lungs every 6 (six) hours as needed for wheezing. 1 Inhaler 12  . nitroGLYCERIN (NITROSTAT) 0.4 MG SL tablet DISSOLVE ONE TABLET UNDER THE TONGUE EVERY 5 MINUTES AS NEEDED FOR CHEST PAIN.  DO NOT EXCEED A TOTAL OF 3 DOSES IN 15 MINUTES 25 tablet 0  . pantoprazole (PROTONIX) 40 MG tablet TAKE 1 TABLET BY MOUTH  DAILY 90 tablet 3  . rosuvastatin (CRESTOR) 40 MG tablet TAKE 1 TABLET BY MOUTH  DAILY 90 tablet 3  . traMADol (ULTRAM) 50 MG tablet Take 2 tablets (100 mg total) by mouth every 6 (six) hours as needed. 10 tablet 0  . clopidogrel (PLAVIX) 75 MG tablet Take 1 tablet (75 mg total) by mouth daily. 90 tablet 3  . losartan (COZAAR) 50 MG tablet Take 1.5 tablets (75 mg  total) by mouth daily. 45 tablet 3   No current facility-administered medications for this encounter.    BP 140/80   Pulse 76   Wt 79.5 kg (175 lb 3.2 oz)   SpO2 97%   BMI 25.14 kg/m  General: NAD Neck: No JVD, no thyromegaly or thyroid nodule.  Lungs: Prolonged expiratory phase without frank wheezing.  CV: Nondisplaced PMI.  Heart regular S1/S2, no S3/S4, no murmur.  No peripheral edema.  No carotid bruit.  Normal pedal pulses.  Abdomen: Soft, nontender, no hepatosplenomegaly, no distention.  Skin: Intact without lesions or rashes.  Neurologic: Alert and oriented x 3.  Psych: Normal affect. Extremities: No clubbing or cyanosis.  HEENT: Normal.   Assessment/Plan: 1. CAD: s/p anterior STEMI 10/15 with DES to mLAD.  Also had CTO mid LCx with collaterals that was not treated percutaneously.  Last Cardiolite in 5/17 was low risk with no ischemia. In 10/19, he had crescendo angina.  Cath with 90% D1 stenosis, had DES to D1.  He is now having progressive exertional dyspnea/chest heaviness.  He has had some wheezing so cannot fully rule out asthmas as cause.  I had wanted a Cardiolite but it never got scheduled.  Symptoms continue to progress and he is not wheezing on exam today (was wheezing last appointment). Today's echo showed normal EF.  - Continue ASA 81 mg daily and Plavix 75 mg daily.  - Hold off on bisoprolol for now with recent wheezing - Increase losartan to 75 mg daily with HTN. BMET today.  - Continue Crestor 40 mg daily =>check lipids/LFTs in 2 months (he just restarted this).    - Start Imdur 30 mg daily to see if this helps symptoms.  - With exertional symptoms that continue to  progress, I think that we will go ahead and proceed with cath at this point. We discussed risks/benefits and he agrees to procedure.   2. Ischemic cardiomyopathy: EF 45-50% on initial echo, improved to 55-60% in 1/16.  Echo 12/19 with EF 55-60%.  Now with exertional dyspnea, NYHA class II symptoms.  He  is not volume overloaded today.  Echo today showed EF 55-60% with normal RV.  - Increase losartan to 75 mg daily today with HTN.  - Hold bisoprolol with recent wheezing (not taking regularly).  3. Hyperlipidemia: He is back on Crestor 40 mg daily, lipids/LFTs in 2 months.   4. HTN: BP elevated, increase losartan as above.    5. Asthma: Wheezing last appointment, now just with prolonged expiratory phase. This could certainly be the cause of his symptoms if uncontrolled.    - Referred for pulmonary evaluation.  6. Snoring: Does not  want sleep study.   Followup after cath.   Marca Ancona 07/24/2020

## 2020-07-24 NOTE — Patient Instructions (Signed)
START Imdur 30mg  (1 tablet) daily  INCREASE Losartan 75mg  (1 1/2 tablets) daily  Labs done today, your results will be available in MyChart, we will contact you for abnormal readings.  Your physician recommends that you schedule repeat labs in 2 months  Your physician recommends that you schedule a follow-up appointment in: 2 weeks with the APP clinic  If you have any questions or concerns before your next appointment please send a message through [place] or call our office at [phone].    TO LEAVE A MESSAGE FOR THE NURSE SELECT OPTION 2, PLEASE LEAVE A MESSAGE INCLUDING: . YOUR NAME . DATE OF BIRTH . CALL BACK NUMBER . REASON FOR CALL**this is important as we prioritize the call backs  YOU WILL RECEIVE A CALL BACK THE SAME DAY AS LONG AS YOU CALL BEFORE 4:00 PM   HEART CATHETERIZATION INSTRUCTIONS  You are scheduled for a Cardiac Catheterization on Friday, January 7 with Dr. [phone].  1. Please arrive at the [facility] (Main Entrance A) at [facility]: [address] [place], [address] [place] at 5:30 AM (This time is two hours before your procedure to ensure your preparation). Free valet parking service is available.   Special note: Every effort is made to have your procedure done on time. Please understand that emergencies sometimes delay scheduled procedures.  2. Diet: Do not eat solid foods after midnight.  The patient may have clear liquids until 5am upon the day of the procedure.  3. COVID TEST: 1/5 at 10:10am   [address] [address] [address] [place]  4. Medication instructions in preparation for your procedure:  On the morning of your procedure, take any morning medicines NOT listed above.  You may use sips of water.  5. Plan for one night stay--bring personal belongings. 6. Bring a current list of your medications and current insurance cards. 7. You MUST have a responsible person to drive you home. 8. Someone MUST be with you the first 24  hours after you arrive home or your discharge will be delayed. 9. Please wear clothes that are easy to get on and off and wear slip-on shoes.  Thank you for allowing [place] to care for you!   -- [facility]

## 2020-07-24 NOTE — H&P (View-Only) (Signed)
Patient ID: Jerry Montoya, male   DOB: 01-20-1956, 65 y.o.   MRN: [id number] PCP: Dr. Felipa Eth Cardiology: Dr. Aundra Dubin  65 y.o. with history of asthma and colon cancer was admitted in 10/15 with anterior STEMI.  He had fresh total occlusion of the mid LAD and chronic total occlusion of the mid LCx with collaterals.  He had Xience DES to LAD.  Echo that admission showed EF 45-50%.  Repeat echo in 1/16 showed EF up to 55-60%.  In 5/17, he had a Cardiolite that was low risk with a fixed apical inferior defect and no ischemia. In 10/19, he had LHC due to angina showing CTO proximal LCx with collaterals, 60% pLAD, 90% D1 stenosis.  He had DES to D1.   Patient had right inguinal hernia repair in 8/20.   Echo was done today, EF 55-60% with normal wall motion.   Patient returns for followup of CAD.  For about 3-4 months, he has had increased exertional dyspnea.  He gets short of breath climbing stairs and has noted some chest heaviness.  He is not noting dyspnea walking on flat ground.  He is more concerned as symptoms are occurring with any walk up an elevation or if he walks fast.  Dyspnea/heaviness resolves with rest.  He has not taken NTG.  No orthopnea/PND.  He notices occasional wheezing. BP mildly elevated.  After last appointment, I had ordered him to get a Cardiolite.  This never got scheduled, we are not sure why.   Labs (10/15): K 3.9, creatinine 0.81 => 1.0, LDL 129 Labs (10/16): K 3.9, creatinine 0.89, LDL 125, HDL 30 Labs (5/17): LDL 77, HDl 31, K 4.2, creatinine 0.9 Labs (10/18): LDL 82 Labs (6/19): LDL 82, HDL 34, K 4, creatinine 0.84 Labs (11/19): K 3.9, creatinine 0.77 Labs (12/19): LDL 59, HDL 41 Labs (11/21): LDL 127, TGs 204, K 3.8, creatinine 0.87 Labs (12/21): K 3.5, creatinine 0.83  PMH: 1. Asthma: since his 64s.  2. Colon cancer: s/p sigmoid colectomy in 2015.  3. Hyperlipidemia 4. GERD 5. CAD: Anterior STEMI in 10/15, LHC showed total occlusion mid LAD and chronic total  occlusion mid LCx with collaterals.  He had Xience DES to the LAD.   - Cardiolite (5/17) with fixed apical inferior defect, no ischemia.  Low risk study.  EF 58%.  - LHC (10/19) done for angina: CTO proximal LCx with collaterals, 60% pLAD, 90% D1 stenosis.  He had DES to D1.  6. Ischemic cardiomyopathy: Echo (10/15) with EF 45-50%, anterior/anteroseptal/apical severe hypokinesis.  Echo (1/16) with EF 55-60%.  - Echo (12/19): EF 55-60%, mild MR.  - Echo (12/21): EF 55-60% with normal wall motion.  7. Right inguinal hernia repair 8/20  SH: [employer], married Camdynn Aldis), nonsmoker.   FH: Father with CAD.   ROS: All systems reviewed and negative except as per HPI.   Current Outpatient Medications  Medication Sig Dispense Refill  . acetaminophen (TYLENOL) 500 MG tablet Take 500 mg by mouth every 8 (eight) hours as needed for mild pain.     [name] Kitchen aspirin EC 81 MG tablet Take 1 tablet (81 mg total) by mouth daily. Swallow whole. 30 tablet 11  . EPIPEN 2-PAK 0.3 MG/0.3ML SOAJ injection Inject 0.3 mg into the skin once as needed for anaphylaxis.   0  . isosorbide mononitrate (IMDUR) 30 MG 24 hr tablet Take 1 tablet (30 mg total) by mouth daily. 30 tablet 3  . levalbuterol (XOPENEX HFA) 45 MCG/ACT inhaler Inhale  1-2 puffs into the lungs every 6 (six) hours as needed for wheezing. 1 Inhaler 12  . nitroGLYCERIN (NITROSTAT) 0.4 MG SL tablet DISSOLVE ONE TABLET UNDER THE TONGUE EVERY 5 MINUTES AS NEEDED FOR CHEST PAIN.  DO NOT EXCEED A TOTAL OF 3 DOSES IN 15 MINUTES 25 tablet 0  . pantoprazole (PROTONIX) 40 MG tablet TAKE 1 TABLET BY MOUTH  DAILY 90 tablet 3  . rosuvastatin (CRESTOR) 40 MG tablet TAKE 1 TABLET BY MOUTH  DAILY 90 tablet 3  . traMADol (ULTRAM) 50 MG tablet Take 2 tablets (100 mg total) by mouth every 6 (six) hours as needed. 10 tablet 0  . clopidogrel (PLAVIX) 75 MG tablet Take 1 tablet (75 mg total) by mouth daily. 90 tablet 3  . losartan (COZAAR) 50 MG tablet Take 1.5 tablets (75 mg  total) by mouth daily. 45 tablet 3   No current facility-administered medications for this encounter.    BP 140/80   Pulse 76   Wt 79.5 kg (175 lb 3.2 oz)   SpO2 97%   BMI 25.14 kg/m  General: NAD Neck: No JVD, no thyromegaly or thyroid nodule.  Lungs: Prolonged expiratory phase without frank wheezing.  CV: Nondisplaced PMI.  Heart regular S1/S2, no S3/S4, no murmur.  No peripheral edema.  No carotid bruit.  Normal pedal pulses.  Abdomen: Soft, nontender, no hepatosplenomegaly, no distention.  Skin: Intact without lesions or rashes.  Neurologic: Alert and oriented x 3.  Psych: Normal affect. Extremities: No clubbing or cyanosis.  HEENT: Normal.   Assessment/Plan: 1. CAD: s/p anterior STEMI 10/15 with DES to mLAD.  Also had CTO mid LCx with collaterals that was not treated percutaneously.  Last Cardiolite in 5/17 was low risk with no ischemia. In 10/19, he had crescendo angina.  Cath with 90% D1 stenosis, had DES to D1.  He is now having progressive exertional dyspnea/chest heaviness.  He has had some wheezing so cannot fully rule out asthmas as cause.  I had wanted a Cardiolite but it never got scheduled.  Symptoms continue to progress and he is not wheezing on exam today (was wheezing last appointment). Today's echo showed normal EF.  - Continue ASA 81 mg daily and Plavix 75 mg daily.  - Hold off on bisoprolol for now with recent wheezing - Increase losartan to 75 mg daily with HTN. BMET today.  - Continue Crestor 40 mg daily =>check lipids/LFTs in 2 months (he just restarted this).    - Start Imdur 30 mg daily to see if this helps symptoms.  - With exertional symptoms that continue to  progress, I think that we will go ahead and proceed with cath at this point. We discussed risks/benefits and he agrees to procedure.   2. Ischemic cardiomyopathy: EF 45-50% on initial echo, improved to 55-60% in 1/16.  Echo 12/19 with EF 55-60%.  Now with exertional dyspnea, NYHA class II symptoms.  He  is not volume overloaded today.  Echo today showed EF 55-60% with normal RV.  - Increase losartan to 75 mg daily today with HTN.  - Hold bisoprolol with recent wheezing (not taking regularly).  3. Hyperlipidemia: He is back on Crestor 40 mg daily, lipids/LFTs in 2 months.   4. HTN: BP elevated, increase losartan as above.    5. Asthma: Wheezing last appointment, now just with prolonged expiratory phase. This could certainly be the cause of his symptoms if uncontrolled.    - Referred for pulmonary evaluation.  6. Snoring: Does not  want sleep study.   Followup after cath.   Marca Ancona 07/24/2020

## 2020-07-25 ENCOUNTER — Other Ambulatory Visit (HOSPITAL_COMMUNITY)
Admission: RE | Admit: 2020-07-25 | Discharge: 2020-07-25 | Disposition: A | Discharge status: Home / Self Care | Payer: Medicare HMO | Source: Ambulatory Visit | Attending: Interventional Cardiology | Admitting: Interventional Cardiology

## 2020-07-25 DIAGNOSIS — Z01812 Encounter for preprocedural laboratory examination: Secondary | ICD-10-CM | POA: Insufficient documentation

## 2020-07-25 DIAGNOSIS — Z20822 Contact with and (suspected) exposure to covid-19: Secondary | ICD-10-CM | POA: Insufficient documentation

## 2020-07-25 LAB — SARS CORONAVIRUS 2 (TAT 6-24 HRS): SARS Coronavirus 2: NEGATIVE

## 2020-07-26 ENCOUNTER — Telehealth (HOSPITAL_COMMUNITY): Payer: Self-pay

## 2020-07-26 NOTE — Telephone Encounter (Signed)
Contacted [employer] regarding his Heart Catheterization : date of service 07/27/2020 Authorization # [id number] Ref # [id number] Call back # [phone]

## 2020-07-27 ENCOUNTER — Encounter (HOSPITAL_COMMUNITY): Payer: Self-pay | Admitting: Cardiology

## 2020-07-27 ENCOUNTER — Ambulatory Visit (HOSPITAL_COMMUNITY)
Admission: RE | Admit: 2020-07-27 | Discharge: 2020-07-27 | Disposition: A | Discharge status: Home / Self Care | Payer: Medicare HMO | Attending: Cardiology | Admitting: Cardiology

## 2020-07-27 ENCOUNTER — Ambulatory Visit (HOSPITAL_COMMUNITY)
Admission: RE | Disposition: A | Discharge status: Home / Self Care | Payer: Self-pay | Source: Home / Self Care | Attending: Cardiology

## 2020-07-27 DIAGNOSIS — I10 Essential (primary) hypertension: Secondary | ICD-10-CM | POA: Insufficient documentation

## 2020-07-27 DIAGNOSIS — I25110 Atherosclerotic heart disease of native coronary artery with unstable angina pectoris: Secondary | ICD-10-CM | POA: Diagnosis not present

## 2020-07-27 DIAGNOSIS — R0683 Snoring: Secondary | ICD-10-CM | POA: Insufficient documentation

## 2020-07-27 DIAGNOSIS — E785 Hyperlipidemia, unspecified: Secondary | ICD-10-CM | POA: Diagnosis not present

## 2020-07-27 DIAGNOSIS — Z7982 Long term (current) use of aspirin: Secondary | ICD-10-CM | POA: Insufficient documentation

## 2020-07-27 DIAGNOSIS — Z79899 Other long term (current) drug therapy: Secondary | ICD-10-CM | POA: Insufficient documentation

## 2020-07-27 DIAGNOSIS — J45909 Unspecified asthma, uncomplicated: Secondary | ICD-10-CM | POA: Diagnosis not present

## 2020-07-27 DIAGNOSIS — Z955 Presence of coronary angioplasty implant and graft: Secondary | ICD-10-CM | POA: Diagnosis not present

## 2020-07-27 DIAGNOSIS — I255 Ischemic cardiomyopathy: Secondary | ICD-10-CM | POA: Diagnosis not present

## 2020-07-27 DIAGNOSIS — Z7902 Long term (current) use of antithrombotics/antiplatelets: Secondary | ICD-10-CM | POA: Diagnosis not present

## 2020-07-27 DIAGNOSIS — I5022 Chronic systolic (congestive) heart failure: Secondary | ICD-10-CM

## 2020-07-27 DIAGNOSIS — I25119 Atherosclerotic heart disease of native coronary artery with unspecified angina pectoris: Secondary | ICD-10-CM | POA: Diagnosis not present

## 2020-07-27 HISTORY — PX: LEFT HEART CATH AND CORONARY ANGIOGRAPHY: CATH118249

## 2020-07-27 SURGERY — LEFT HEART CATH AND CORONARY ANGIOGRAPHY
Anesthesia: LOCAL

## 2020-07-27 MED ORDER — ASPIRIN 81 MG PO CHEW: 81.0000 mg | CHEWABLE_TABLET | ORAL | Status: DC

## 2020-07-27 MED ORDER — SODIUM CHLORIDE 0.9% FLUSH: 3.0000 mL | Freq: Two times a day (BID) | INTRAVENOUS | Status: DC

## 2020-07-27 MED ORDER — SODIUM CHLORIDE 0.9 % WEIGHT BASED INFUSION: 1.0000 mL/kg/h | INTRAVENOUS | Status: DC

## 2020-07-27 MED ORDER — MIDAZOLAM HCL 2 MG/2ML IJ SOLN
INTRAMUSCULAR | Status: DC | PRN
  Administered 2020-07-27 (×2): 1 mg via INTRAVENOUS

## 2020-07-27 MED ORDER — ACETAMINOPHEN 325 MG PO TABS: 650.0000 mg | ORAL_TABLET | ORAL | Status: DC | PRN

## 2020-07-27 MED ORDER — HEPARIN (PORCINE) IN NACL 1000-0.9 UT/500ML-% IV SOLN
INTRAVENOUS | Status: DC | PRN
  Administered 2020-07-27 (×2): 500 mL

## 2020-07-27 MED ORDER — LIDOCAINE HCL (PF) 1 % IJ SOLN
INTRAMUSCULAR | Status: DC | PRN
  Administered 2020-07-27: 2 mL

## 2020-07-27 MED ORDER — SODIUM CHLORIDE 0.9% FLUSH: 3.0000 mL | INTRAVENOUS | Status: DC | PRN

## 2020-07-27 MED ORDER — VERAPAMIL HCL 2.5 MG/ML IV SOLN
INTRAVENOUS | Status: AC
  Filled 2020-07-27: qty 2

## 2020-07-27 MED ORDER — FENTANYL CITRATE (PF) 100 MCG/2ML IJ SOLN
INTRAMUSCULAR | Status: AC
  Filled 2020-07-27: qty 2

## 2020-07-27 MED ORDER — IOHEXOL 350 MG/ML SOLN
INTRAVENOUS | Status: DC | PRN
  Administered 2020-07-27: 40 mL

## 2020-07-27 MED ORDER — LABETALOL HCL 5 MG/ML IV SOLN: 10.0000 mg | INTRAVENOUS | Status: DC | PRN

## 2020-07-27 MED ORDER — FENTANYL CITRATE (PF) 100 MCG/2ML IJ SOLN
INTRAMUSCULAR | Status: DC | PRN
  Administered 2020-07-27 (×2): 25 ug via INTRAVENOUS

## 2020-07-27 MED ORDER — SODIUM CHLORIDE 0.9 % IV SOLN: INTRAVENOUS | Status: DC

## 2020-07-27 MED ORDER — HYDRALAZINE HCL 20 MG/ML IJ SOLN: 10.0000 mg | INTRAMUSCULAR | Status: DC | PRN

## 2020-07-27 MED ORDER — MIDAZOLAM HCL 2 MG/2ML IJ SOLN
INTRAMUSCULAR | Status: AC
  Filled 2020-07-27: qty 2

## 2020-07-27 MED ORDER — SODIUM CHLORIDE 0.9 % IV SOLN: 250.0000 mL | INTRAVENOUS | Status: DC | PRN

## 2020-07-27 MED ORDER — HEPARIN SODIUM (PORCINE) 1000 UNIT/ML IJ SOLN
INTRAMUSCULAR | Status: AC
  Filled 2020-07-27: qty 1

## 2020-07-27 MED ORDER — ONDANSETRON HCL 4 MG/2ML IJ SOLN: 4.0000 mg | Freq: Four times a day (QID) | INTRAMUSCULAR | Status: DC | PRN

## 2020-07-27 MED ORDER — HEPARIN (PORCINE) IN NACL 1000-0.9 UT/500ML-% IV SOLN
INTRAVENOUS | Status: AC
  Filled 2020-07-27: qty 1500

## 2020-07-27 MED ORDER — VERAPAMIL HCL 2.5 MG/ML IV SOLN
INTRAVENOUS | Status: DC | PRN
  Administered 2020-07-27: 10 mL via INTRA_ARTERIAL

## 2020-07-27 MED ORDER — LIDOCAINE HCL (PF) 1 % IJ SOLN
INTRAMUSCULAR | Status: AC
  Filled 2020-07-27: qty 30

## 2020-07-27 MED ORDER — HEPARIN SODIUM (PORCINE) 1000 UNIT/ML IJ SOLN
INTRAMUSCULAR | Status: DC | PRN
  Administered 2020-07-27: 4000 [IU] via INTRAVENOUS

## 2020-07-27 SURGICAL SUPPLY — 10 items
CATH 5FR JL3.5 JR4 ANG PIG MP (CATHETERS) ×1 IMPLANT
DEVICE RAD COMP TR BAND LRG (VASCULAR PRODUCTS) ×1 IMPLANT
GLIDESHEATH SLEND SS 6F .021 (SHEATH) ×1 IMPLANT
GUIDEWIRE INQWIRE 1.5J.035X260 (WIRE) IMPLANT
INQWIRE 1.5J .035X260CM (WIRE) ×2
KIT HEART LEFT (KITS) ×2 IMPLANT
PACK CARDIAC CATHETERIZATION (CUSTOM PROCEDURE TRAY) ×2 IMPLANT
SYR MEDRAD MARK 7 150ML (SYRINGE) ×2 IMPLANT
TRANSDUCER W/STOPCOCK (MISCELLANEOUS) ×2 IMPLANT
TUBING CIL FLEX 10 FLL-RA (TUBING) ×2 IMPLANT

## 2020-07-27 NOTE — Discharge Instructions (Signed)
Radial Site Care  This sheet gives you information about how to care for yourself after your procedure. Your health care provider may also give you more specific instructions. If you have problems or questions, contact your health care provider. What can I expect after the procedure? After the procedure, it is common to have:  Bruising and tenderness at the catheter insertion area. Follow these instructions at home: Medicines  Take over-the-counter and prescription medicines only as told by your health care provider. Insertion site care  Follow instructions from your health care provider about how to take care of your insertion site. Make sure you: ? Wash your hands with soap and water before you change your bandage (dressing). If soap and water are not available, use hand sanitizer. ? Change your dressing as told by your health care provider. ? Leave stitches (sutures), skin glue, or adhesive strips in place. These skin closures may need to stay in place for 2 weeks or longer. If adhesive strip edges start to loosen and curl up, you may trim the loose edges. Do not remove adhesive strips completely unless your health care provider tells you to do that.  Check your insertion site every day for signs of infection. Check for: ? Redness, swelling, or pain. ? Fluid or blood. ? Pus or a bad smell. ? Warmth.  Do not take baths, swim, or use a hot tub until your health care provider approves.  You may shower 24-48 hours after the procedure, or as directed by your health care provider. ? Remove the dressing and gently wash the site with plain soap and water. ? Pat the area dry with a clean towel. ? Do not rub the site. That could cause bleeding.  Do not apply powder or lotion to the site. Activity   For 24 hours after the procedure, or as directed by your health care provider: ? Do not flex or bend the affected arm. ? Do not push or pull heavy objects with the affected arm. ? Do not  drive yourself home from the hospital or clinic. You may drive 24 hours after the procedure unless your health care provider tells you not to. ? Do not operate machinery or power tools.  Do not lift anything that is heavier than 10 lb (4.5 kg), or the limit that you are told, until your health care provider says that it is safe.  Ask your health care provider when it is okay to: ? Return to work or school. ? Resume usual physical activities or sports. ? Resume sexual activity. General instructions  If the catheter site starts to bleed, raise your arm and put firm pressure on the site. If the bleeding does not stop, get help right away. This is a medical emergency.  If you went home on the same day as your procedure, a responsible adult should be with you for the first 24 hours after you arrive home.  Keep all follow-up visits as told by your health care provider. This is important. Contact a health care provider if:  You have a fever.  You have redness, swelling, or yellow drainage around your insertion site. Get help right away if:  You have unusual pain at the radial site.  The catheter insertion area swells very fast.  The insertion area is bleeding, and the bleeding does not stop when you hold steady pressure on the area.  Your arm or hand becomes pale, cool, tingly, or numb. These symptoms may represent a serious problem   that is an emergency. Do not wait to see if the symptoms will go away. Get medical help right away. Call your local emergency services (911 in the [place] Do not drive yourself to the hospital. Summary  After the procedure, it is common to have bruising and tenderness at the site.  Follow instructions from your health care provider about how to take care of your radial site wound. Check the wound every day for signs of infection.  Do not lift anything that is heavier than 10 lb (4.5 kg), or the limit that you are told, until your health care provider says  that it is safe. This information is not intended to replace advice given to you by your health care provider. Make sure you discuss any questions you have with your health care provider. Document Revised: 08/12/2017 Document Reviewed: 08/12/2017 Elsevier Patient Education  2020 Elsevier Inc.  

## 2020-07-27 NOTE — Interval H&P Note (Signed)
History and Physical Interval Note:  07/27/2020 7:55 AM  Jerry Montoya  has presented today for surgery, with the diagnosis of heart failure.  The various methods of treatment have been discussed with the patient and family. After consideration of risks, benefits and other options for treatment, the patient has consented to  Procedure(s): LEFT HEART CATH AND CORONARY ANGIOGRAPHY (N/A) as a surgical intervention.  The patient's history has been reviewed, patient examined, no change in status, stable for surgery.  I have reviewed the patient's chart and labs.  Questions were answered to the patient's satisfaction.     Shadana Pry [employer]

## 2020-07-30 ENCOUNTER — Ambulatory Visit: Payer: Medicare HMO | Admitting: Emergency Medicine

## 2020-07-30 ENCOUNTER — Other Ambulatory Visit: Payer: Self-pay

## 2020-07-30 ENCOUNTER — Encounter: Payer: Self-pay | Admitting: Emergency Medicine

## 2020-07-30 DIAGNOSIS — J4530 Mild persistent asthma, uncomplicated: Secondary | ICD-10-CM

## 2020-07-30 MED ORDER — BUDESONIDE-FORMOTEROL FUMARATE 160-4.5 MCG/ACT IN AERO: 2.0000 | INHALATION_SPRAY | Freq: Two times a day (BID) | RESPIRATORY_TRACT | 6 refills | Status: DC

## 2020-07-30 MED FILL — Heparin Sod (Porcine)-NaCl IV Soln 1000 Unit/500ML-0.9%: INTRAVENOUS | Qty: 500 | Status: AC

## 2020-07-30 NOTE — Progress Notes (Signed)
con

## 2020-07-30 NOTE — Assessment & Plan Note (Signed)
Progressive dyspnea with exertion, fairly frequent Xopenex use.  He is not on any controller medication at least not reliably.  He had a reassuring cardiac catheterization which suggests that asthma, possible fixed obstruction given the fact these had asthma for many years, is responsible for his progressive dyspnea.  I will stop his Flovent, change to Symbicort 160/4.5 to see if he gets benefit.  Keep Xopenex available to use if needed.  Follow-up with him in about a month to see if he is got any benefit.  We will also try to control GERD, allergic rhinitis.  I will hold off on starting an antihistamine for now but he likely will need going forward.  Continue his pantoprazole. We will probably recheck his pulmonary function testing at some point but I do not believe we need to do so in order to justify getting him on scheduled bronchodilator therapy given the severity of his obstruction from 2014.

## 2020-07-30 NOTE — Patient Instructions (Addendum)
Stop Flovent Start Symbicort 160/4.5 mcg, 2 puffs twice a day.  Rinse and gargle after using this medication Keep Xopenex available to use 2 puffs up to every 4 hours if needed for shortness of breath, chest tightness, wheezing. Continue your pantoprazole 40 mg once daily.  Take this 1 hour around food. Depending on how your symptoms progress we may decide to start you on a daily antihistamine going forward. COVID-19 vaccine up-to-date.  Recommend that you get the booster shot Flu shot up-to-date Follow with Dr Lamonte Sakai in 1 month or next available

## 2020-07-30 NOTE — Progress Notes (Signed)
Subjective:    Patient ID: Jerry Montoya, male    DOB: 10/17/1955, 65 y.o.   MRN: [id number]  HPI 65 year old man, never smoker, with a history of colon cancer, hypertension, CAD with ischemic cardiomyopathy and CHF, hyperlipidemia, GERD.  He has a history of asthma that was made in his 79's.   He is referred today for evaluation of progressive exertional SOB 4 months. Happens especially w stairs. No nocturnal awakenings. Has some chest tightness, some wheeze.  Currently managed on Flovent but he doesn't use daily, has Xopenex which he uses approximately every other day, usually for wheeze or SOB.  Back on pantoprazole, GERD better controlled. Some congestion, not every day.   Pulmonary function testing 03/15/2013 reviewed by me shows very severe obstruction with a profound bronchodilator response, hyperinflated volumes and normal diffusion capacity.  Left heart catheterization 07/27/2020 showed stable LAD and RCA disease, known occluded circumflex with collaterals.  There were no vessels that were targets for intervention.   Review of Systems As per [facility]  Past Medical History:  Diagnosis Date  . Asthma   . Cancer ([place]) 2015   colon cancer  . CHF (congestive heart failure) ([place])   . Complication of anesthesia    slow to awaken  . Esophageal reflux   . Hypercholesterolemia   . Hypertension   . Myocardial infarction [facility]) 2015, 06/2018     Family History  Problem Relation Age of Onset  . Coronary artery disease Father      Social History   Socioeconomic History  . Marital status: Married    Spouse name: Not on file  . Number of children: 2  . Years of education: Not on file  . Highest education level: Not on file  Occupational History  . Occupation: Hotel manager  Tobacco Use  . Smoking status: Never Smoker  . Smokeless tobacco: Never Used  Vaping Use  . Vaping Use: Never used  Substance and Sexual Activity  . Alcohol use: No  . Drug use: No  . Sexual activity:  Not on file  Other Topics Concern  . Not on file  Social History Narrative  . Not on file   Social Determinants of Health   Financial Resource Strain: Not on file  Food Insecurity: Not on file  Transportation Needs: Not on file  Physical Activity: Not on file  Stress: Not on file  Social Connections: Not on file  Intimate Partner Violence: Not on file     Allergies  Allergen Reactions  . Albuterol Other (See Comments)    Caused heart attack  . Azithromycin Other (See Comments)    Thrush     Outpatient Medications Prior to Visit  Medication Sig Dispense Refill  . acetaminophen (TYLENOL) 500 MG tablet Take 500 mg by mouth every 8 (eight) hours as needed for mild pain.     [name] Kitchen aspirin EC 81 MG tablet Take 1 tablet (81 mg total) by mouth daily. Swallow whole. 30 tablet 11  . clopidogrel (PLAVIX) 75 MG tablet Take 1 tablet (75 mg total) by mouth daily. 90 tablet 3  . EPIPEN 2-PAK 0.3 MG/0.3ML SOAJ injection Inject 0.3 mg into the skin once as needed for anaphylaxis.   0  . fluticasone (FLOVENT HFA) 110 MCG/ACT inhaler Inhale 2 puffs into the lungs 2 (two) times daily.    [name] Kitchen levalbuterol (XOPENEX HFA) 45 MCG/ACT inhaler Inhale 1-2 puffs into the lungs every 6 (six) hours as needed for wheezing. (Patient taking differently: Inhale 1  puff into the lungs daily.) 1 Inhaler 12  . losartan (COZAAR) 50 MG tablet Take 1.5 tablets (75 mg total) by mouth daily. 45 tablet 3  . nitroGLYCERIN (NITROSTAT) 0.4 MG SL tablet DISSOLVE ONE TABLET UNDER THE TONGUE EVERY 5 MINUTES AS NEEDED FOR CHEST PAIN.  DO NOT EXCEED A TOTAL OF 3 DOSES IN 15 MINUTES (Patient taking differently: Place 0.4 mg under the tongue every 5 (five) minutes as needed for chest pain.) 25 tablet 0  . pantoprazole (PROTONIX) 40 MG tablet TAKE 1 TABLET BY MOUTH  DAILY (Patient taking differently: Take 40 mg by mouth daily.) 90 tablet 3  . rosuvastatin (CRESTOR) 40 MG tablet TAKE 1 TABLET BY MOUTH  DAILY (Patient taking differently: Take  40 mg by mouth daily.) 90 tablet 3  . isosorbide mononitrate (IMDUR) 30 MG 24 hr tablet Take 1 tablet (30 mg total) by mouth daily. (Patient not taking: Reported on 07/30/2020) 30 tablet 3   No facility-administered medications prior to visit.         Objective:   Physical Exam Vitals:   07/30/20 1603  BP: 130/80  Pulse: 91  Temp: 97.8 F (36.6 C)  TempSrc: Temporal  SpO2: 98%  Weight: 175 lb 12.8 oz (79.7 kg)  Height: 5\' 10"  (1.778 m)   Gen: Pleasant, well-nourished, in no distress,  normal affect  ENT: No lesions,  mouth clear,  oropharynx clear, no postnasal drip  Neck: No JVD, end exp UA noise on a forced exp  Lungs: No use of accessory muscles, no crackles or wheezing on normal respiration, no wheeze on forced expiration  Cardiovascular: RRR, heart sounds normal, no murmur or gallops, no peripheral edema  Musculoskeletal: No deformities, no cyanosis or clubbing  Neuro: alert, awake, non focal  Skin: Warm, no lesions or rash      Assessment & Plan:  Asthma Progressive dyspnea with exertion, fairly frequent Xopenex use.  He is not on any controller medication at least not reliably.  He had a reassuring cardiac catheterization which suggests that asthma, possible fixed obstruction given the fact these had asthma for many years, is responsible for his progressive dyspnea.  I will stop his Flovent, change to Symbicort 160/4.5 to see if he gets benefit.  Keep Xopenex available to use if needed.  Follow-up with him in about a month to see if he is got any benefit.  We will also try to control GERD, allergic rhinitis.  I will hold off on starting an antihistamine for now but he likely will need going forward.  Continue his pantoprazole. We will probably recheck his pulmonary function testing at some point but I do not believe we need to do so in order to justify getting him on scheduled bronchodilator therapy given the severity of his obstruction from 2014.  Baltazar Apo, MD,  PhD 07/30/2020, 4:40 PM [facility] [phone] or if no answer [phone]

## 2020-07-31 ENCOUNTER — Telehealth: Payer: Self-pay | Admitting: Emergency Medicine

## 2020-07-31 NOTE — Telephone Encounter (Signed)
Called pharmacy, states that Symbicort is covered by pt's insurance with a $47/30 day copay.   Called pt and relayed this to him- advised that if this is preferred by insurance there was not a cheaper alternative.  Filled out patient assistance forms and mailed to home address on file.  Nothing further needed at this time- will close encounter.

## 2020-08-13 ENCOUNTER — Other Ambulatory Visit: Payer: Self-pay

## 2020-08-13 ENCOUNTER — Ambulatory Visit (HOSPITAL_COMMUNITY)
Admission: RE | Admit: 2020-08-13 | Discharge: 2020-08-13 | Disposition: A | Discharge status: Home / Self Care | Payer: Medicare HMO | Source: Ambulatory Visit | Attending: Cardiology | Admitting: Cardiology

## 2020-08-13 ENCOUNTER — Other Ambulatory Visit (HOSPITAL_COMMUNITY): Payer: Self-pay | Admitting: Cardiology

## 2020-08-13 VITALS — BP 130/85 | HR 94 | Wt 176.6 lb

## 2020-08-13 DIAGNOSIS — I2510 Atherosclerotic heart disease of native coronary artery without angina pectoris: Secondary | ICD-10-CM | POA: Diagnosis not present

## 2020-08-13 DIAGNOSIS — Z7982 Long term (current) use of aspirin: Secondary | ICD-10-CM | POA: Insufficient documentation

## 2020-08-13 DIAGNOSIS — E785 Hyperlipidemia, unspecified: Secondary | ICD-10-CM | POA: Diagnosis not present

## 2020-08-13 DIAGNOSIS — Z8249 Family history of ischemic heart disease and other diseases of the circulatory system: Secondary | ICD-10-CM | POA: Diagnosis not present

## 2020-08-13 DIAGNOSIS — Z9049 Acquired absence of other specified parts of digestive tract: Secondary | ICD-10-CM | POA: Insufficient documentation

## 2020-08-13 DIAGNOSIS — Z7902 Long term (current) use of antithrombotics/antiplatelets: Secondary | ICD-10-CM | POA: Insufficient documentation

## 2020-08-13 DIAGNOSIS — I110 Hypertensive heart disease with heart failure: Secondary | ICD-10-CM | POA: Diagnosis not present

## 2020-08-13 DIAGNOSIS — Z79899 Other long term (current) drug therapy: Secondary | ICD-10-CM | POA: Diagnosis not present

## 2020-08-13 DIAGNOSIS — I25110 Atherosclerotic heart disease of native coronary artery with unstable angina pectoris: Secondary | ICD-10-CM | POA: Insufficient documentation

## 2020-08-13 DIAGNOSIS — I255 Ischemic cardiomyopathy: Secondary | ICD-10-CM | POA: Insufficient documentation

## 2020-08-13 DIAGNOSIS — J45909 Unspecified asthma, uncomplicated: Secondary | ICD-10-CM | POA: Insufficient documentation

## 2020-08-13 DIAGNOSIS — I252 Old myocardial infarction: Secondary | ICD-10-CM | POA: Diagnosis not present

## 2020-08-13 DIAGNOSIS — I5022 Chronic systolic (congestive) heart failure: Secondary | ICD-10-CM | POA: Diagnosis not present

## 2020-08-13 DIAGNOSIS — Z955 Presence of coronary angioplasty implant and graft: Secondary | ICD-10-CM | POA: Diagnosis not present

## 2020-08-13 LAB — BASIC METABOLIC PANEL
Anion gap: 8 (ref 5–15)
BUN: 13 mg/dL (ref 8–23)
CO2: 24 mmol/L (ref 22–32)
Calcium: 9.2 mg/dL (ref 8.9–10.3)
Chloride: 105 mmol/L (ref 98–111)
Creatinine, Ser: 0.8 mg/dL (ref 0.61–1.24)
GFR, Estimated: >60 mL/min (ref >60)
Glucose, Bld: 103 mg/dL — ABNORMAL HIGH (ref 70–99)
Potassium: 3.4 mmol/L — ABNORMAL LOW (ref 3.5–5.1)
Sodium: 137 mmol/L (ref 135–145)

## 2020-08-13 LAB — MAGNESIUM: Magnesium: 2.1 mg/dL (ref 1.7–2.4)

## 2020-08-13 MED ORDER — MAGNESIUM OXIDE -MG SUPPLEMENT 200 MG PO TABS: 200.0000 mg | ORAL_TABLET | Freq: Every day | ORAL | 3 refills | Status: DC

## 2020-08-13 NOTE — Patient Instructions (Addendum)
START Magnesium Oxide 200mg  daily for cramps (you can pick this up over the counter)  Labs done today, your results will be available in MyChart, we will contact you for abnormal readings.  Please call our office in June 2022 to schedule your follow up appointment  If you have any questions or concerns before your next appointment please send [place] a message through [place] or call our office at [phone].    TO LEAVE A MESSAGE FOR THE NURSE SELECT OPTION 2, PLEASE LEAVE A MESSAGE INCLUDING: . YOUR NAME . DATE OF BIRTH . CALL BACK NUMBER . REASON FOR CALL**this is important as we prioritize the call backs  YOU WILL RECEIVE A CALL BACK THE SAME DAY AS LONG AS YOU CALL BEFORE 4:00 PM

## 2020-08-13 NOTE — Progress Notes (Signed)
PCP: Dr. Felipa Eth Cardiology: Dr. Aundra Dubin  65 y.o. with history of asthma and colon cancer was admitted in 10/15 with anterior STEMI.  He had fresh total occlusion of the mid LAD and chronic total occlusion of the mid LCx with collaterals.  He had Xience DES to LAD.  Echo that admission showed EF 45-50%.  Repeat echo in 1/16 showed EF up to 55-60%.  In 5/17, he had a Cardiolite that was low risk with a fixed apical inferior defect and no ischemia. In 10/19, he had LHC due to angina showing CTO proximal LCx with collaterals, 60% pLAD, 90% D1 stenosis.  He had DES to D1.   Patient had right inguinal hernia repair in 8/20.   Echo 12/21 showed EF 55-60% with normal wall motion.  With ongoing exertional chest pain and dyspnea, he had LHC in 1/22.  This showed stable chronically occluded LCX with nonobstructive CAD elsewhere.   He saw pulmonology and was started on Symbicort.   Today, he returns for followup of CAD.  Wheezing is improved on Symbicort, no longer noting significant exertional dyspnea.  Only has rare, occasional atypical chest pain (not exertional).  Last night, had severe muscle cramps throughout arms/legs (woke up with this).  Does not think he did anything different.  BP controlled today and weight stable.   Labs (10/15): K 3.9, creatinine 0.81 => 1.0, LDL 129 Labs (10/16): K 3.9, creatinine 0.89, LDL 125, HDL 30 Labs (5/17): LDL 77, HDl 31, K 4.2, creatinine 0.9 Labs (10/18): LDL 82 Labs (6/19): LDL 82, HDL 34, K 4, creatinine 0.84 Labs (11/19): K 3.9, creatinine 0.77 Labs (12/19): LDL 59, HDL 41 Labs (11/21): LDL 127, TGs 204, K 3.8, creatinine 0.87 Labs (12/21): K 3.5, creatinine 0.83 Labs (1/22): K 3.7, creatinine 0.74  PMH: 1. Asthma: since his 51s.  2. Colon cancer: s/p sigmoid colectomy in 2015.  3. Hyperlipidemia 4. GERD 5. CAD: Anterior STEMI in 10/15, LHC showed total occlusion mid LAD and chronic total occlusion mid LCx with collaterals.  He had Xience DES to  the LAD.   - Cardiolite (5/17) with fixed apical inferior defect, no ischemia.  Low risk study.  EF 58%.  - LHC (10/19) done for angina: CTO proximal LCx with collaterals, 60% pLAD, 90% D1 stenosis.  He had DES to D1.  - LHC (1/22): done for chest tightness/dyspnea. Known occluded LCx with collaterals from the RCA. Stable nonobstructive disease in the LAD and RCA.  6. Ischemic cardiomyopathy: Echo (10/15) with EF 45-50%, anterior/anteroseptal/apical severe hypokinesis.  Echo (1/16) with EF 55-60%.  - Echo (12/19): EF 55-60%, mild MR.  - Echo (12/21): EF 55-60% with normal wall motion.  7. Right inguinal hernia repair 8/20  SH: [employer], married Zarrar Vogelsang), nonsmoker.   FH: Father with CAD.   ROS: All systems reviewed and negative except as per HPI.   Current Outpatient Medications  Medication Sig Dispense Refill  . acetaminophen (TYLENOL) 500 MG tablet Take 500 mg by mouth every 8 (eight) hours as needed for mild pain.     [name] Kitchen aspirin EC 81 MG tablet Take 1 tablet (81 mg total) by mouth daily. Swallow whole. 30 tablet 11  . budesonide-formoterol (SYMBICORT) 160-4.5 MCG/ACT inhaler Inhale 2 puffs into the lungs 2 (two) times daily. 1 each 6  . clopidogrel (PLAVIX) 75 MG tablet Take 1 tablet (75 mg total) by mouth daily. 90 tablet 3  . EPIPEN 2-PAK 0.3 MG/0.3ML SOAJ injection Inject 0.3 mg into the skin  once as needed for anaphylaxis.   0  . fluticasone (FLOVENT HFA) 110 MCG/ACT inhaler Inhale 2 puffs into the lungs 2 (two) times daily.    . isosorbide mononitrate (IMDUR) 30 MG 24 hr tablet Take 1 tablet (30 mg total) by mouth daily. (Patient not taking: Reported on 07/30/2020) 30 tablet 3  . levalbuterol (XOPENEX HFA) 45 MCG/ACT inhaler Inhale 1-2 puffs into the lungs every 6 (six) hours as needed for wheezing. (Patient taking differently: Inhale 1 puff into the lungs daily.) 1 Inhaler 12  . losartan (COZAAR) 50 MG tablet Take 1.5 tablets (75 mg total) by mouth daily. 45 tablet 3  .  nitroGLYCERIN (NITROSTAT) 0.4 MG SL tablet DISSOLVE ONE TABLET UNDER THE TONGUE EVERY 5 MINUTES AS NEEDED FOR CHEST PAIN.  DO NOT EXCEED A TOTAL OF 3 DOSES IN 15 MINUTES (Patient taking differently: Place 0.4 mg under the tongue every 5 (five) minutes as needed for chest pain.) 25 tablet 0  . pantoprazole (PROTONIX) 40 MG tablet TAKE 1 TABLET BY MOUTH  DAILY (Patient taking differently: Take 40 mg by mouth daily.) 90 tablet 3  . rosuvastatin (CRESTOR) 40 MG tablet TAKE 1 TABLET BY MOUTH  DAILY (Patient taking differently: Take 40 mg by mouth daily.) 90 tablet 3   No current facility-administered medications for this visit.   Wt Readings from Last 3 Encounters:  07/30/20 79.7 kg (175 lb 12.8 oz)  07/27/20 78.9 kg (174 lb)  07/24/20 79.5 kg (175 lb 3.2 oz)    There were no vitals taken for this visit. General: NAD Neck: No JVD, no thyromegaly or thyroid nodule.  Lungs: Clear to auscultation bilaterally with normal respiratory effort. CV: Nondisplaced PMI.  Heart regular S1/S2, no S3/S4, no murmur.  No peripheral edema.  No carotid bruit.  Normal pedal pulses.  Abdomen: Soft, nontender, no hepatosplenomegaly, no distention.  Skin: Intact without lesions or rashes.  Neurologic: Alert and oriented x 3.  Psych: Normal affect. Extremities: No clubbing or cyanosis. 2+ radial pulse on right (cath site).  HEENT: Normal.   Assessment/Plan: 1. CAD: s/p anterior STEMI 10/15 with DES to mLAD.  Also had CTO mid LCx with collaterals that was not treated percutaneously.  Last Cardiolite in 5/17 was low risk with no ischemia. In 10/19, he had crescendo angina.  Cath with 90% D1 stenosis, had DES to D1.  Echo 1/22 EF 55-60%.  LHC 1/22 showed known occluded LCx with collaterals from the RCA; stable nonobstructive disease in the LAD & RCA.  I think that his exertional dyspnea and chest pain was likely due to poorly controlled asthma, he is improved on Symbicort.  - Continue ASA 81 mg daily and Plavix 75 mg  daily.  - Hold off on bisoprolol for now with recent wheezing/poor control of asthma.  - Continue losartan to 75 mg daily with HTN.  - Continue Crestor 40 mg daily =>check lipids/LFTs in 2 months (3/22; he just restarted this).    - He does not need to take Imdur.  2. Ischemic cardiomyopathy: EF 45-50% on initial echo, improved to 55-60% in 1/16.  Echo 12/19 with EF 55-60%.  Echo 1/22 showed EF 55-60% with normal RV.  - Continue losartan 75 mg daily today with HTN.  - Hold bisoprolol with recent wheezing (not taking regularly).  3. Hyperlipidemia: He is back on Crestor 40 mg daily, lipids/LFTs in 2 months (3/22).   4. HTN: BP under better control on higher dose of losartan.    5. Asthma:  Significant, suspect this was the cause of recent exertional symptoms.   - Followed by Dr. Lamonte Sakai - Continue Symbicort, this seems to be helping.  6. Snoring: Does not want sleep study.  7. Cramping: Check Mg and K.  Reasonable to try magnesium oxide 200 mg daily to see if this helps decrease nocturnal cramps.   Followup 6 months.   Loralie Champagne 08/13/2020 2:25 PM

## 2020-08-15 ENCOUNTER — Other Ambulatory Visit: Payer: Medicare HMO

## 2020-08-15 ENCOUNTER — Telehealth (HOSPITAL_COMMUNITY): Payer: Self-pay | Admitting: Cardiology

## 2020-08-15 ENCOUNTER — Telehealth: Payer: Self-pay | Admitting: Emergency Medicine

## 2020-08-15 DIAGNOSIS — Z20822 Contact with and (suspected) exposure to covid-19: Secondary | ICD-10-CM

## 2020-08-15 DIAGNOSIS — I5022 Chronic systolic (congestive) heart failure: Secondary | ICD-10-CM

## 2020-08-15 MED ORDER — POTASSIUM CHLORIDE CRYS ER 20 MEQ PO TBCR: 20.0000 meq | EXTENDED_RELEASE_TABLET | Freq: Every day | ORAL | 3 refills | Status: DC

## 2020-08-15 NOTE — Telephone Encounter (Signed)
Attempted to call patient, he did not answer. VM did not pickup. Will attempt to call patient back later.

## 2020-08-15 NOTE — Telephone Encounter (Signed)
Symptomatic relief until we know what this is. Tylenol and ibuprofen for body aches. Robitussin for cough . He does need to check for fever.  Please have him let us know what his Covid test shows and have him seek emergency care if he develops respiratory distress. If he wants a tele visit please set him up for today or tomorrow. But symptomatic relief at this point.He can stop the Symbicort and see if the symptoms stop, but needs Covid testing regardless. Thanks

## 2020-08-15 NOTE — Telephone Encounter (Signed)
Patient is returning phone call. Patient phone number is [phone].

## 2020-08-15 NOTE — Telephone Encounter (Signed)
-----   Message from Larey Dresser, MD sent at 08/13/2020  3:56 PM EST ----- Potassium is low. May have led to cramping.  He does not need to take magnesium.  Reasonable to take KCl 20 mEq daily with BMET in 10 days.

## 2020-08-15 NOTE — Telephone Encounter (Signed)
Tried calling pt and there was no answer- LMTCB and routing back to triage marked high priority so we can attempt again today.

## 2020-08-15 NOTE — Telephone Encounter (Signed)
Spoke with patient. He verbalized understanding of SG's recommendations. He will let us know about his covid test results. He stated that he last used the Symbicort yesterday and has already noticed a huge difference in his symptoms. He will stop the Symbicort for now and use the albuterol as needed.   Advised him to call us back if anything changes.   Nothing further needed at time of call.

## 2020-08-15 NOTE — Telephone Encounter (Signed)
Pt returned call Aware of results repeat labs 2/7

## 2020-08-15 NOTE — Telephone Encounter (Signed)
I called and spoke with the pt  He is c/o sore throat, hoarseness, body aches, HA and cough with large amounts of clear sputum x 3 days  He is not having any wheezing but does have increased SOB off and on  He states has not checked his temp He is using the symbicort that was recently started by Dr Lamonte Sakai on 07/30/20  He states that his spouse told him that she read where symbicort can cause flu-like symptoms  He wonders if he is just having side effects  He has had covid vax x 2 only- no booster and last vax 09/01/19  I advised that he go ahead and schedule covid test, as these symptoms are concerning for covid  He will do this  Will send msg to the APP of the day to see what else is rec  Thanks!

## 2020-08-16 LAB — NOVEL CORONAVIRUS, NAA: SARS-CoV-2, NAA: DETECTED — AB

## 2020-08-16 LAB — SARS-COV-2, NAA 2 DAY TAT

## 2020-08-17 ENCOUNTER — Telehealth: Payer: Self-pay | Admitting: Unknown Physician Specialty

## 2020-08-17 ENCOUNTER — Telehealth (HOSPITAL_COMMUNITY): Payer: Self-pay | Admitting: Family

## 2020-08-17 NOTE — Telephone Encounter (Signed)
Called to discuss with patient about COVID-19 symptoms and the use of one of the available treatments for those with mild to moderate Covid symptoms and at a high risk of hospitalization.  Pt appears to qualify for outpatient treatment due to co-morbid conditions and/or a member of an at-risk group in accordance with the FDA Emergency Use Authorization.    Symptom onset: 1/21 Vaccinated: yes  Pt is past the benefit window for covid Rx.  He is feeling well.  Quarantine limits discussed.    Jerry Montoya

## 2020-08-17 NOTE — Telephone Encounter (Signed)
Called to discuss with patient about COVID-19 symptoms and the use of one of the available treatments for those with mild to moderate Covid symptoms and at a high risk of hospitalization.  Pt appears to qualify for outpatient treatment due to co-morbid conditions and/or a member of an at-risk group in accordance with the FDA Emergency Use Authorization.    Unable to reach pt - VM left  Jerry Montoya   

## 2020-08-22 ENCOUNTER — Other Ambulatory Visit (HOSPITAL_COMMUNITY): Payer: Self-pay | Admitting: *Deleted

## 2020-08-22 ENCOUNTER — Telehealth: Payer: Self-pay | Admitting: Emergency Medicine

## 2020-08-22 MED ORDER — LEVALBUTEROL TARTRATE 45 MCG/ACT IN AERO: 1.0000 | INHALATION_SPRAY | Freq: Four times a day (QID) | RESPIRATORY_TRACT | 12 refills | Status: DC | PRN

## 2020-08-22 NOTE — Telephone Encounter (Signed)
Spoke with the pt  He wanted to let us know that after he called on 08/15/20 he tested positive for covid the following day  He states that he is "pretty much over it"- just having some minimal congestion at this point- taking mucinex prn  He is back on his symbicort and feels like breathing is back to baseline  I advised continue to get rest, fluids, eat well, if SOB comes back and he can't catch his breath or sats below 88% ra go to ED  Pt verbalized understanding  Nothing further needed

## 2020-08-27 ENCOUNTER — Other Ambulatory Visit: Payer: Self-pay

## 2020-08-27 ENCOUNTER — Ambulatory Visit (HOSPITAL_COMMUNITY)
Admission: RE | Admit: 2020-08-27 | Discharge: 2020-08-27 | Disposition: A | Discharge status: Home / Self Care | Payer: Medicare HMO | Source: Ambulatory Visit | Attending: Cardiology | Admitting: Cardiology

## 2020-08-27 DIAGNOSIS — I5022 Chronic systolic (congestive) heart failure: Secondary | ICD-10-CM | POA: Diagnosis not present

## 2020-08-27 DIAGNOSIS — E785 Hyperlipidemia, unspecified: Secondary | ICD-10-CM | POA: Insufficient documentation

## 2020-08-27 LAB — BASIC METABOLIC PANEL
Anion gap: 9 (ref 5–15)
BUN: 16 mg/dL (ref 8–23)
CO2: 23 mmol/L (ref 22–32)
Calcium: 8.9 mg/dL (ref 8.9–10.3)
Chloride: 107 mmol/L (ref 98–111)
Creatinine, Ser: 0.76 mg/dL (ref 0.61–1.24)
GFR, Estimated: >60 mL/min (ref >60)
Glucose, Bld: 163 mg/dL — ABNORMAL HIGH (ref 70–99)
Potassium: 3.7 mmol/L (ref 3.5–5.1)
Sodium: 139 mmol/L (ref 135–145)

## 2020-08-27 LAB — HEPATIC FUNCTION PANEL
ALT: 17 U/L (ref 0–44)
AST: 19 U/L (ref 15–41)
Albumin: 3.6 g/dL (ref 3.5–5.0)
Alkaline Phosphatase: 57 U/L (ref 38–126)
Bilirubin, Direct: 0.1 mg/dL (ref 0.0–0.2)
Indirect Bilirubin: 0.8 mg/dL (ref 0.3–0.9)
Total Bilirubin: 0.9 mg/dL (ref 0.3–1.2)
Total Protein: 6.4 g/dL — ABNORMAL LOW (ref 6.5–8.1)

## 2020-08-27 LAB — LIPID PANEL
Cholesterol: 109 mg/dL (ref 0–200)
HDL: 30 mg/dL — ABNORMAL LOW (ref >40)
LDL Cholesterol: 66 mg/dL (ref 0–99)
Total CHOL/HDL Ratio: 3.6 RATIO
Triglycerides: 63 mg/dL (ref <150)
VLDL: 13 mg/dL (ref 0–40)

## 2020-09-05 ENCOUNTER — Encounter: Payer: Self-pay | Admitting: Emergency Medicine

## 2020-09-05 ENCOUNTER — Other Ambulatory Visit: Payer: Self-pay

## 2020-09-05 ENCOUNTER — Ambulatory Visit: Payer: Medicare HMO | Admitting: Emergency Medicine

## 2020-09-05 DIAGNOSIS — K219 Gastro-esophageal reflux disease without esophagitis: Secondary | ICD-10-CM

## 2020-09-05 DIAGNOSIS — J4530 Mild persistent asthma, uncomplicated: Secondary | ICD-10-CM | POA: Diagnosis not present

## 2020-09-05 NOTE — Assessment & Plan Note (Signed)
Continue pantoprazole.  Attempt to control GERD as well as possible given its contribution to asthma symptoms.

## 2020-09-05 NOTE — Progress Notes (Signed)
   Subjective:    Patient ID: Jerry Montoya, male    DOB: 1956/02/16, 65 y.o.   MRN: [id number]  HPI 65 year old man, never smoker, with a history of colon cancer, hypertension, CAD with ischemic cardiomyopathy and CHF, hyperlipidemia, GERD.  He has a history of asthma that was made in his 48's.   He is referred today for evaluation of progressive exertional SOB 4 months. Happens especially w stairs. No nocturnal awakenings. Has some chest tightness, some wheeze.  Currently managed on Flovent but he doesn't use daily, has Xopenex which he uses approximately every other day, usually for wheeze or SOB.  Back on pantoprazole, GERD better controlled. Some congestion, not every day.   Pulmonary function testing 03/15/2013 reviewed by me shows very severe obstruction with a profound bronchodilator response, hyperinflated volumes and normal diffusion capacity.  Left heart catheterization 07/27/2020 showed stable LAD and RCA disease, known occluded circumflex with collaterals.  There were no vessels that were targets for intervention.  ROV 09/05/20 --65 year old never smoker with a history of CAD and ischemic cardiomyopathy (stable on recent catheterization), colon cancer, hypertension, longstanding asthma diagnosed in his 71s.  I met him for increased dyspnea last month and we tried changing his Flovent to Symbicort to see if he would get benefit.  Also continue pantoprazole, discussed possibly starting an antihistamine but deferred. He was unfortunately dx with COVID in late January, now feels back to baseline. He feels that he benefited from the symbicort, breathing is better. xopenex use has decreased significantly. Exercise tolerance is OK.  COVID vaccine is up to date.      Review of Systems As per HPI      Objective:   Physical Exam Vitals:   09/05/20 0922  BP: 122/74  Pulse: 97  Temp: 98.2 F (36.8 C)  TempSrc: Temporal  SpO2: 98%  Weight: 174 lb 7.2 oz (79.1 kg)  Height: _0   (1.778 m)   Gen: Pleasant, well-nourished, in no distress,  normal affect  ENT: No lesions,  mouth clear,  oropharynx clear, no postnasal drip  Neck: No JVD, end exp UA noise on a forced exp  Lungs: No use of accessory muscles, no crackles or wheezing on normal respiration, no wheeze on forced expiration  Cardiovascular: RRR, heart sounds normal, no murmur or gallops, no peripheral edema  Musculoskeletal: No deformities, no cyanosis or clubbing  Neuro: alert, awake, non focal  Skin: Warm, no lesions or rash      Assessment & Plan:  Asthma He has benefited significantly with the initiation of Symbicort.  In retrospect he notes that he was not actually taking the Flovent on a regular basis.  That being said I think he needs a bronchodilator component of the Symbicort.  He actually did well even though he had COVID since I saw him.  His Xopenex use is down.  We will continue the Symbicort as ordered.  He will need pulmonary function testing at some point in the future.  Suspect he may ultimately require an antihistamine as rhinitis likely contributes some to his asthma control.  GERD (gastroesophageal reflux disease) Continue pantoprazole.  Attempt to control GERD as well as possible given its contribution to asthma symptoms.  Baltazar Apo, MD, PhD 09/05/2020, 9:54 AM [facility] [phone] or if no answer [phone]

## 2020-09-05 NOTE — Patient Instructions (Signed)
We will plan to continue Symbicort 2 puffs twice a day.  Rinse and gargle after you use it. Keep your Xopenex available to use 2 puffs if needed for shortness of breath, chest tightness, wheezing. Continue your pantoprazole as you have been taking it If you develop increased nasal drainage, congestion then you should start an antihistamine such as generic Claritin or Zyrtec Follow with Dr. Lamonte Sakai in 1 year or sooner if you have any changes in your breathing, trouble with the medications.

## 2020-09-05 NOTE — Assessment & Plan Note (Addendum)
He has benefited significantly with the initiation of Symbicort.  In retrospect he notes that he was not actually taking the Flovent on a regular basis.  That being said I think he needs a bronchodilator component of the Symbicort.  He actually did well even though he had COVID since I saw him.  His Xopenex use is down.  We will continue the Symbicort as ordered.  He will need pulmonary function testing at some point in the future.  Suspect he may ultimately require an antihistamine as rhinitis likely contributes some to his asthma control.

## 2020-10-01 ENCOUNTER — Other Ambulatory Visit (HOSPITAL_COMMUNITY): Payer: Self-pay | Admitting: Cardiology

## 2020-10-01 ENCOUNTER — Other Ambulatory Visit: Payer: Self-pay

## 2020-10-01 ENCOUNTER — Ambulatory Visit (HOSPITAL_COMMUNITY): Admission: RE | Admit: 2020-10-01 | Payer: Medicare HMO | Source: Ambulatory Visit

## 2020-10-01 ENCOUNTER — Encounter (HOSPITAL_COMMUNITY): Payer: Self-pay

## 2020-10-01 DIAGNOSIS — I2109 ST elevation (STEMI) myocardial infarction involving other coronary artery of anterior wall: Secondary | ICD-10-CM

## 2020-10-01 NOTE — Progress Notes (Signed)
lui

## 2020-10-10 ENCOUNTER — Telehealth (HOSPITAL_COMMUNITY): Payer: Self-pay

## 2020-10-10 NOTE — Telephone Encounter (Signed)
Received a fax requesting medical records from [facility]. Records were successfully faxed to: [phone] on 10/08/20 ,which was the number provided.. Medical request form will be scanned into patients chart.

## 2020-11-27 DIAGNOSIS — E7800 Pure hypercholesterolemia, unspecified: Secondary | ICD-10-CM | POA: Diagnosis not present

## 2020-11-27 DIAGNOSIS — I25119 Atherosclerotic heart disease of native coronary artery with unspecified angina pectoris: Secondary | ICD-10-CM | POA: Diagnosis not present

## 2020-11-28 ENCOUNTER — Other Ambulatory Visit (HOSPITAL_COMMUNITY): Payer: Self-pay | Admitting: Cardiology

## 2020-11-28 DIAGNOSIS — I255 Ischemic cardiomyopathy: Secondary | ICD-10-CM

## 2020-11-28 DIAGNOSIS — I2510 Atherosclerotic heart disease of native coronary artery without angina pectoris: Secondary | ICD-10-CM

## 2020-12-24 DIAGNOSIS — M542 Cervicalgia: Secondary | ICD-10-CM | POA: Diagnosis not present

## 2020-12-24 DIAGNOSIS — M47812 Spondylosis without myelopathy or radiculopathy, cervical region: Secondary | ICD-10-CM | POA: Diagnosis not present

## 2021-02-27 DIAGNOSIS — R7303 Prediabetes: Secondary | ICD-10-CM | POA: Diagnosis not present

## 2021-02-27 DIAGNOSIS — Z1331 Encounter for screening for depression: Secondary | ICD-10-CM | POA: Diagnosis not present

## 2021-02-27 DIAGNOSIS — R5382 Chronic fatigue, unspecified: Secondary | ICD-10-CM | POA: Diagnosis not present

## 2021-02-27 DIAGNOSIS — K7689 Other specified diseases of liver: Secondary | ICD-10-CM | POA: Diagnosis not present

## 2021-02-27 DIAGNOSIS — E785 Hyperlipidemia, unspecified: Secondary | ICD-10-CM | POA: Diagnosis not present

## 2021-02-27 DIAGNOSIS — C189 Malignant neoplasm of colon, unspecified: Secondary | ICD-10-CM | POA: Diagnosis not present

## 2021-02-27 DIAGNOSIS — I2510 Atherosclerotic heart disease of native coronary artery without angina pectoris: Secondary | ICD-10-CM | POA: Diagnosis not present

## 2021-02-27 DIAGNOSIS — I5189 Other ill-defined heart diseases: Secondary | ICD-10-CM | POA: Diagnosis not present

## 2021-02-27 DIAGNOSIS — I10 Essential (primary) hypertension: Secondary | ICD-10-CM | POA: Diagnosis not present

## 2021-02-27 DIAGNOSIS — I700 Atherosclerosis of aorta: Secondary | ICD-10-CM | POA: Diagnosis not present

## 2021-02-27 DIAGNOSIS — Z1339 Encounter for screening examination for other mental health and behavioral disorders: Secondary | ICD-10-CM | POA: Diagnosis not present

## 2021-02-27 DIAGNOSIS — I255 Ischemic cardiomyopathy: Secondary | ICD-10-CM | POA: Diagnosis not present

## 2021-04-29 ENCOUNTER — Telehealth: Payer: Self-pay | Admitting: Emergency Medicine

## 2021-04-29 NOTE — Telephone Encounter (Signed)
Pt is going on vacation to [place] and wanted to check with RB about being in the higher elevations.  Is there anything that he should watch out for or not do?  RB please advise. Thanks

## 2021-04-30 NOTE — Telephone Encounter (Signed)
Spoke with the pt and notified of response per RB. Pt verbalized understanding. Nothing further needed.  

## 2021-04-30 NOTE — Telephone Encounter (Signed)
Patient anticipate that he might notice some increased shortness of breath at altitude.  He has never had low oxygen levels on our evaluation.  Given his clinical status and PFT I do not believe there is a significant concern that he would have desaturations at altitude either. Many people benefit from becoming acclimated to altitude in [place] for several days before moving on to higher altitudes.  This might help him adjust and make the associated shortness of breath less bothersome.  He does need to make sure he continues to take his Symbicort and has albuterol available.

## 2021-05-01 NOTE — Progress Notes (Addendum)
PCP: Dr. Felipa Eth Cardiology: Dr. Aundra Dubin  65 y.o. with history of asthma and colon cancer was admitted in 10/15 with anterior STEMI.  He had fresh total occlusion of the mid LAD and chronic total occlusion of the mid LCx with collaterals.  He had Xience DES to LAD.  Echo that admission showed EF 45-50%.  Repeat echo in 1/16 showed EF up to 55-60%.  In 5/17, he had a Cardiolite that was low risk with a fixed apical inferior defect and no ischemia. In 10/19, he had LHC due to angina showing CTO proximal LCx with collaterals, 60% pLAD, 90% D1 stenosis.  He had DES to D1.   Patient had right inguinal hernia repair in 8/20.   Echo 12/21 showed EF 55-60% with normal wall motion.  With ongoing exertional chest pain and dyspnea, he had LHC in 1/22.  This showed stable chronically occluded LCX with nonobstructive CAD elsewhere.   He saw pulmonology and was started on Symbicort.   Follow up 1/22 no significant exertional dyspnea.  Only has rare, occasional atypical chest pain (not exertional).   Today he returns for an acute visit. Experiencing on-going fatigue and weakness for past 2 months. He was doing [place] rides but unable to do that anymore. Asthma well-controlled, although he uses albuterol daily. He has occasional atypical chest pain Overall feeling fine. Denies dizziness, edema, or PND/Orthopnea. Appetite ok. No fever or chills. Weight at home 170 pounds. Taking all medications.  BP at home 130-140s  ECG (personally reviewed): sinus rhythm ReDs: 40%  Labs (10/15): K 3.9, creatinine 0.81 => 1.0, LDL 129 Labs (10/16): K 3.9, creatinine 0.89, LDL 125, HDL 30 Labs (5/17): LDL 77, HDl 31, K 4.2, creatinine 0.9 Labs (10/18): LDL 82 Labs (6/19): LDL 82, HDL 34, K 4, creatinine 0.84 Labs (11/19): K 3.9, creatinine 0.77 Labs (12/19): LDL 59, HDL 41 Labs (11/21): LDL 127, TGs 204, K 3.8, creatinine 0.87 Labs (12/21): K 3.5, creatinine 0.83 Labs (1/22): K 3.7, creatinine 0.74  PMH: 1.  Asthma: since his 26s.  2. Colon cancer: s/p sigmoid colectomy in 2015.  3. Hyperlipidemia 4. GERD 5. CAD: Anterior STEMI in 10/15, LHC showed total occlusion mid LAD and chronic total occlusion mid LCx with collaterals.  He had Xience DES to the LAD.   - Cardiolite (5/17) with fixed apical inferior defect, no ischemia.  Low risk study.  EF 58%.  - LHC (10/19) done for angina: CTO proximal LCx with collaterals, 60% pLAD, 90% D1 stenosis.  He had DES to D1.  - LHC (1/22): done for chest tightness/dyspnea. Known occluded LCx with collaterals from the RCA. Stable nonobstructive disease in the LAD and RCA.  6. Ischemic cardiomyopathy: Echo (10/15) with EF 45-50%, anterior/anteroseptal/apical severe hypokinesis.  Echo (1/16) with EF 55-60%.  - Echo (12/19): EF 55-60%, mild MR.  - Echo (12/21): EF 55-60% with normal wall motion.  7. Right inguinal hernia repair 8/20  SH: [employer], married Jerry Montoya), nonsmoker.   FH: Father with CAD.   ROS: All systems reviewed and negative except as per HPI.   Current Outpatient Medications  Medication Sig Dispense Refill   acetaminophen (TYLENOL) 500 MG tablet Take 500 mg by mouth every 8 (eight) hours as needed for mild pain.      aspirin EC 81 MG tablet Take 1 tablet (81 mg total) by mouth daily. Swallow whole. 30 tablet 11   budesonide-formoterol (SYMBICORT) 160-4.5 MCG/ACT inhaler Inhale 2 puffs into the lungs 2 (two) times daily.  1 each 6   clopidogrel (PLAVIX) 75 MG tablet Take 1 tablet (75 mg total) by mouth daily. 90 tablet 3   EPIPEN 2-PAK 0.3 MG/0.3ML SOAJ injection Inject 0.3 mg into the skin once as needed for anaphylaxis.   0   levalbuterol (XOPENEX HFA) 45 MCG/ACT inhaler Inhale 1-2 puffs into the lungs every 6 (six) hours as needed for wheezing. 1 each 12   losartan (COZAAR) 50 MG tablet Take 1.5 tablets (75 mg total) by mouth daily. 45 tablet 3   nitroGLYCERIN (NITROSTAT) 0.4 MG SL tablet DISSOLVE ONE TABLET UNDER THE TONGUE EVERY 5  MINUTES AS NEEDED FOR CHEST PAIN.  DO NOT EXCEED A TOTAL OF 3 DOSES IN 15 MINUTES 25 tablet 0   pantoprazole (PROTONIX) 40 MG tablet TAKE 1 TABLET BY MOUTH  DAILY 90 tablet 3   potassium chloride SA (KLOR-CON) 20 MEQ tablet Take 1 tablet (20 mEq total) by mouth daily. 90 tablet 3   rosuvastatin (CRESTOR) 40 MG tablet Take 1 tablet by mouth once daily 90 tablet 0   No current facility-administered medications for this encounter.   Wt Readings from Last 3 Encounters:  05/02/21 78.4 kg (172 lb 12.8 oz)  09/05/20 79.1 kg (174 lb 7.2 oz)  08/13/20 80.1 kg (176 lb 9.6 oz)   BP (!) 148/97   Pulse 87   Wt 78.4 kg (172 lb 12.8 oz)   SpO2 96%   BMI 24.79 kg/m   General:  NAD. No resp difficulty HEENT: Normal Neck: Supple. JVP 7-8. Carotids 2+ bilat; no bruits. No lymphadenopathy or thryomegaly appreciated. Cor: PMI nondisplaced. Regular rate & rhythm. No rubs, gallops or murmurs. Lungs: Clear Abdomen: Soft, nontender, nondistended. No hepatosplenomegaly. No bruits or masses. Good bowel sounds. Extremities: No cyanosis, clubbing, rash, edema Neuro: Alert & oriented x 3, cranial nerves grossly intact. Moves all 4 extremities w/o difficulty. Affect pleasant.  Assessment/Plan: 1. CAD: s/p anterior STEMI 10/15 with DES to mLAD.  Also had CTO mid LCx with collaterals that was not treated percutaneously.  Last Cardiolite in 5/17 was low risk with no ischemia. In 10/19, he had crescendo angina.  Cath with 90% D1 stenosis, had DES to D1.  Echo 1/22 EF 55-60%.  LHC 1/22 showed known occluded LCx with collaterals from the RCA; stable nonobstructive disease in the LAD & RCA.  Previous exertional dyspnea and chest pain was likely due to poorly controlled asthma, he is improved on Symbicort. He continues with rare, occasional chest pain, not requriing SL nitro. We discussed trial of Imdur but he does not think he needs this. - Continue ASA 81 mg daily and Plavix 75 mg daily.  - He is off bisoprolol with poor  asthma control.  - Continue losartan 75 mg daily with HTN.  - Continue Crestor 40 mg daily =>check lipids/LFTs today.  2. Ischemic cardiomyopathy: EF 45-50% on initial echo, improved to 55-60% in 1/16.  Echo 12/19 with EF 55-60%.  Echo 1/22 showed EF 55-60% with normal RV. He is mildly volume overloaded today on exam, ReDs 40% and NYHA II-early III symptoms. With his worsening exertional dyspnea and fatigue, I think it is reasonable to get CPX (with EIB) to quantify heart failure vs pulmonary limitations. - Start lasix 20 mg daily x 3 days with 20 KCl, then PRN for weight gain/edema. - Continue losartan 75 mg daily. Encouraged to take daily. - Off bisoprolol with previous wheezing/asthma. 3. Hyperlipidemia: Continue Crestor 40 mg daily, check lipids/LFTs today.  4. HTN: BP  elevated today, he has not been taking his losartan regularly. 5. Asthma: Followed by Dr. Lamonte Sakai. No wheezing on exam today. - Continue Symbicort, this seems to be helping.  6. Snoring/daytime fatigue: We discussed sleep study, he is agreeable. Will arrange for home sleep study.  Followup with Dr. Aundra Dubin as scheduled.  [place] FNP 05/02/2021 3:47 PM

## 2021-05-02 ENCOUNTER — Ambulatory Visit (HOSPITAL_COMMUNITY)
Admission: RE | Admit: 2021-05-02 | Discharge: 2021-05-02 | Disposition: A | Discharge status: Home / Self Care | Payer: Medicare HMO | Source: Ambulatory Visit | Attending: Family Medicine | Admitting: Family Medicine

## 2021-05-02 ENCOUNTER — Other Ambulatory Visit: Payer: Self-pay

## 2021-05-02 ENCOUNTER — Encounter (HOSPITAL_COMMUNITY): Payer: Self-pay

## 2021-05-02 VITALS — BP 150/88 | HR 87 | Wt 172.8 lb

## 2021-05-02 DIAGNOSIS — Z7951 Long term (current) use of inhaled steroids: Secondary | ICD-10-CM | POA: Insufficient documentation

## 2021-05-02 DIAGNOSIS — R400 Somnolence: Secondary | ICD-10-CM

## 2021-05-02 DIAGNOSIS — I10 Essential (primary) hypertension: Secondary | ICD-10-CM | POA: Diagnosis not present

## 2021-05-02 DIAGNOSIS — Z9114 Patient's other noncompliance with medication regimen: Secondary | ICD-10-CM | POA: Insufficient documentation

## 2021-05-02 DIAGNOSIS — I255 Ischemic cardiomyopathy: Secondary | ICD-10-CM | POA: Insufficient documentation

## 2021-05-02 DIAGNOSIS — I252 Old myocardial infarction: Secondary | ICD-10-CM | POA: Insufficient documentation

## 2021-05-02 DIAGNOSIS — Z79899 Other long term (current) drug therapy: Secondary | ICD-10-CM | POA: Insufficient documentation

## 2021-05-02 DIAGNOSIS — Z7982 Long term (current) use of aspirin: Secondary | ICD-10-CM | POA: Insufficient documentation

## 2021-05-02 DIAGNOSIS — J45909 Unspecified asthma, uncomplicated: Secondary | ICD-10-CM | POA: Insufficient documentation

## 2021-05-02 DIAGNOSIS — Z85038 Personal history of other malignant neoplasm of large intestine: Secondary | ICD-10-CM | POA: Diagnosis not present

## 2021-05-02 DIAGNOSIS — I5022 Chronic systolic (congestive) heart failure: Secondary | ICD-10-CM | POA: Diagnosis not present

## 2021-05-02 DIAGNOSIS — I2510 Atherosclerotic heart disease of native coronary artery without angina pectoris: Secondary | ICD-10-CM | POA: Insufficient documentation

## 2021-05-02 DIAGNOSIS — E785 Hyperlipidemia, unspecified: Secondary | ICD-10-CM | POA: Diagnosis not present

## 2021-05-02 DIAGNOSIS — Z7902 Long term (current) use of antithrombotics/antiplatelets: Secondary | ICD-10-CM | POA: Insufficient documentation

## 2021-05-02 DIAGNOSIS — R0683 Snoring: Secondary | ICD-10-CM | POA: Insufficient documentation

## 2021-05-02 DIAGNOSIS — Z8249 Family history of ischemic heart disease and other diseases of the circulatory system: Secondary | ICD-10-CM | POA: Diagnosis not present

## 2021-05-02 DIAGNOSIS — I110 Hypertensive heart disease with heart failure: Secondary | ICD-10-CM | POA: Insufficient documentation

## 2021-05-02 LAB — LIPID PANEL
Cholesterol: 134 mg/dL (ref 0–200)
HDL: 32 mg/dL — ABNORMAL LOW (ref >40)
LDL Cholesterol: 81 mg/dL (ref 0–99)
Total CHOL/HDL Ratio: 4.2 RATIO
Triglycerides: 105 mg/dL (ref <150)
VLDL: 21 mg/dL (ref 0–40)

## 2021-05-02 LAB — COMPREHENSIVE METABOLIC PANEL
ALT: 14 U/L (ref 0–44)
AST: 18 U/L (ref 15–41)
Albumin: 3.9 g/dL (ref 3.5–5.0)
Alkaline Phosphatase: 57 U/L (ref 38–126)
Anion gap: 6 (ref 5–15)
BUN: 18 mg/dL (ref 8–23)
CO2: 24 mmol/L (ref 22–32)
Calcium: 9.3 mg/dL (ref 8.9–10.3)
Chloride: 107 mmol/L (ref 98–111)
Creatinine, Ser: 0.89 mg/dL (ref 0.61–1.24)
GFR, Estimated: >60 mL/min (ref >60)
Glucose, Bld: 90 mg/dL (ref 70–99)
Potassium: 4.1 mmol/L (ref 3.5–5.1)
Sodium: 137 mmol/L (ref 135–145)
Total Bilirubin: 0.8 mg/dL (ref 0.3–1.2)
Total Protein: 6.8 g/dL (ref 6.5–8.1)

## 2021-05-02 LAB — CBC
HCT: 45.3 % (ref 39.0–52.0)
Hemoglobin: 15.2 g/dL (ref 13.0–17.0)
MCH: 30.1 pg (ref 26.0–34.0)
MCHC: 33.6 g/dL (ref 30.0–36.0)
MCV: 89.7 fL (ref 80.0–100.0)
Platelets: 191 10*3/uL (ref 150–400)
RBC: 5.05 MIL/uL (ref 4.22–5.81)
RDW: 12.7 % (ref 11.5–15.5)
WBC: 6.7 10*3/uL (ref 4.0–10.5)
nRBC: 0 % (ref 0.0–0.2)

## 2021-05-02 MED ORDER — POTASSIUM CHLORIDE CRYS ER 20 MEQ PO TBCR: 20.0000 meq | EXTENDED_RELEASE_TABLET | Freq: Every day | ORAL | 0 refills | Status: DC

## 2021-05-02 MED ORDER — FUROSEMIDE 20 MG PO TABS: 20.0000 mg | ORAL_TABLET | Freq: Every day | ORAL | 11 refills | Status: DC | PRN

## 2021-05-02 NOTE — Patient Instructions (Addendum)
Labs done today. We will contact you only if your labs are abnormal.  START Lasix 20mg  (1 tablet) by mouth daily for 3 days THEN DECREASE to 1 tablet by mouth daily as needed for swelling/edema.  TAKE POTASSIUM 20MEQ(1 TABLET) BY MOUTH DAILY FOR 3 DAYS THEN STOP COMPLETELY.   No other medication changes were made. Please continue all current medications as prescribed.  Your physician has recommended that you have a cardiopulmonary stress test (CPX). CPX testing is a non-invasive measurement of heart and lung function. It replaces a traditional treadmill stress test. This type of test provides a tremendous amount of information that relates not only to your present condition but also for future outcomes. This test combines measurements of you ventilation, respiratory gas exchange in the lungs, electrocardiogram (EKG), blood pressure and physical response before, during, and following an exercise protocol. Please refer to the handout given to you during your appointment today.   Your provider has recommended that you have a home sleep study.  We have provided you with the equipment in our office today. Please download the app and follow the instructions. YOUR PIN NUMBER IS: [id number]. Once you have completed the test you just dispose of the equipment, the information is automatically uploaded to [place] via blue-tooth technology. If your test is positive for sleep apnea and you need a home CPAP machine you will be contacted by Dr Theodosia Blender office [facility]) to set this up.  Your physician recommends that you keep your scheduled appointment with Dr. Aundra Dubin on Wednesday November 9th 2022 at 8:40am [place]   If you have any questions or concerns before your next appointment please send [place] a message through [place] or call our office at [phone].    TO LEAVE A MESSAGE FOR THE NURSE SELECT OPTION 2, PLEASE LEAVE A MESSAGE INCLUDING: YOUR NAME DATE OF BIRTH CALL BACK NUMBER REASON FOR CALL**this is  important as we prioritize the call backs  YOU WILL RECEIVE A CALL BACK THE SAME DAY AS LONG AS YOU CALL BEFORE 4:00 PM   Do the following things EVERYDAY: Weigh yourself in the morning before breakfast. Write it down and keep it in a log. Take your medicines as prescribed Eat low salt foods--Limit salt (sodium) to 2000 mg per day.  Stay as active as you can everyday Limit all fluids for the day to less than 2 liters   At the [facility], you and your health needs are our priority. As part of our continuing mission to provide you with exceptional heart care, we have created designated Provider Care Teams. These Care Teams include your primary Cardiologist (physician) and Advanced Practice Providers (APPs- Physician Assistants and Nurse Practitioners) who all work together to provide you with the care you need, when you need it.   You may see any of the following providers on your designated Care Team at your next follow up: Dr Glori Bickers Dr Haynes Kerns, NP Lyda Jester, [place] Audry Riles, PharmD   Please be sure to bring in all your medications bottles to every appointment.

## 2021-05-02 NOTE — Progress Notes (Signed)
ReDS Vest / Clip - 05/02/21 1600       ReDS Vest / Clip   Station Marker C    Ruler Value 27    ReDS Value Range Moderate volume overload    ReDS Actual Value 40

## 2021-05-03 ENCOUNTER — Telehealth (HOSPITAL_COMMUNITY): Payer: Self-pay | Admitting: Surgery

## 2021-05-03 ENCOUNTER — Encounter (HOSPITAL_COMMUNITY): Payer: Self-pay

## 2021-05-03 NOTE — Telephone Encounter (Signed)
I contacted patient to explain that he was okay to proceed with home sleep study.  He was given the device in [facility] clinic appt.  He acknowledges and is agreeable.  He tells me that he will complete the study within the next few days.

## 2021-05-07 ENCOUNTER — Other Ambulatory Visit: Payer: Self-pay

## 2021-05-07 ENCOUNTER — Ambulatory Visit (HOSPITAL_COMMUNITY): Payer: Medicare HMO | Attending: Internal Medicine

## 2021-05-07 DIAGNOSIS — I5022 Chronic systolic (congestive) heart failure: Secondary | ICD-10-CM | POA: Insufficient documentation

## 2021-05-13 DIAGNOSIS — J4540 Moderate persistent asthma, uncomplicated: Secondary | ICD-10-CM | POA: Diagnosis not present

## 2021-05-13 DIAGNOSIS — J45901 Unspecified asthma with (acute) exacerbation: Secondary | ICD-10-CM | POA: Diagnosis not present

## 2021-05-27 ENCOUNTER — Telehealth (HOSPITAL_COMMUNITY): Payer: Self-pay | Admitting: Surgery

## 2021-05-27 NOTE — Telephone Encounter (Signed)
Patient contacted to remind perform ordered home sleep study.  He is currently out of town however will return and complete soon he says.

## 2021-05-29 ENCOUNTER — Encounter (HOSPITAL_COMMUNITY): Payer: Medicare HMO | Admitting: Cardiology

## 2021-06-03 ENCOUNTER — Ambulatory Visit (HOSPITAL_COMMUNITY)
Admission: RE | Admit: 2021-06-03 | Discharge: 2021-06-03 | Disposition: A | Discharge status: Home / Self Care | Payer: Medicare HMO | Source: Ambulatory Visit | Attending: Cardiology | Admitting: Cardiology

## 2021-06-03 ENCOUNTER — Encounter (HOSPITAL_COMMUNITY): Payer: Self-pay | Admitting: Cardiology

## 2021-06-03 VITALS — BP 130/70 | HR 77 | Wt 172.0 lb

## 2021-06-03 DIAGNOSIS — Z7902 Long term (current) use of antithrombotics/antiplatelets: Secondary | ICD-10-CM | POA: Diagnosis not present

## 2021-06-03 DIAGNOSIS — I10 Essential (primary) hypertension: Secondary | ICD-10-CM | POA: Diagnosis not present

## 2021-06-03 DIAGNOSIS — Z955 Presence of coronary angioplasty implant and graft: Secondary | ICD-10-CM | POA: Diagnosis not present

## 2021-06-03 DIAGNOSIS — J45909 Unspecified asthma, uncomplicated: Secondary | ICD-10-CM | POA: Diagnosis not present

## 2021-06-03 DIAGNOSIS — Z8249 Family history of ischemic heart disease and other diseases of the circulatory system: Secondary | ICD-10-CM | POA: Insufficient documentation

## 2021-06-03 DIAGNOSIS — E782 Mixed hyperlipidemia: Secondary | ICD-10-CM | POA: Diagnosis not present

## 2021-06-03 DIAGNOSIS — R42 Dizziness and giddiness: Secondary | ICD-10-CM | POA: Diagnosis not present

## 2021-06-03 DIAGNOSIS — R400 Somnolence: Secondary | ICD-10-CM | POA: Insufficient documentation

## 2021-06-03 DIAGNOSIS — Z7982 Long term (current) use of aspirin: Secondary | ICD-10-CM | POA: Diagnosis not present

## 2021-06-03 DIAGNOSIS — E785 Hyperlipidemia, unspecified: Secondary | ICD-10-CM | POA: Insufficient documentation

## 2021-06-03 DIAGNOSIS — I509 Heart failure, unspecified: Secondary | ICD-10-CM | POA: Diagnosis not present

## 2021-06-03 DIAGNOSIS — I110 Hypertensive heart disease with heart failure: Secondary | ICD-10-CM | POA: Diagnosis not present

## 2021-06-03 DIAGNOSIS — I25110 Atherosclerotic heart disease of native coronary artery with unstable angina pectoris: Secondary | ICD-10-CM | POA: Diagnosis not present

## 2021-06-03 DIAGNOSIS — Z85038 Personal history of other malignant neoplasm of large intestine: Secondary | ICD-10-CM | POA: Diagnosis not present

## 2021-06-03 DIAGNOSIS — I255 Ischemic cardiomyopathy: Secondary | ICD-10-CM | POA: Insufficient documentation

## 2021-06-03 DIAGNOSIS — R0683 Snoring: Secondary | ICD-10-CM | POA: Diagnosis not present

## 2021-06-03 DIAGNOSIS — I2510 Atherosclerotic heart disease of native coronary artery without angina pectoris: Secondary | ICD-10-CM | POA: Diagnosis not present

## 2021-06-03 DIAGNOSIS — Z79899 Other long term (current) drug therapy: Secondary | ICD-10-CM | POA: Insufficient documentation

## 2021-06-03 DIAGNOSIS — I252 Old myocardial infarction: Secondary | ICD-10-CM | POA: Diagnosis not present

## 2021-06-03 NOTE — Progress Notes (Signed)
PCP: Dr. Felipa Eth Cardiology: Dr. Aundra Dubin  65 y.o. with history of asthma and colon cancer was admitted in 10/15 with anterior STEMI.  He had fresh total occlusion of the mid LAD and chronic total occlusion of the mid LCx with collaterals.  He had Xience DES to LAD.  Echo that admission showed EF 45-50%.  Repeat echo in 1/16 showed EF up to 55-60%.  In 5/17, he had a Cardiolite that was low risk with a fixed apical inferior defect and no ischemia. In 10/19, he had LHC due to angina showing CTO proximal LCx with collaterals, 60% pLAD, 90% D1 stenosis.  He had DES to D1.   Patient had right inguinal hernia repair in 8/20.   Echo 12/21 showed EF 55-60% with normal wall motion.  With ongoing exertional chest pain and dyspnea, he had LHC in 1/22.  This showed stable chronically occluded LCX with nonobstructive CAD elsewhere.   He saw pulmonology and was started on Symbicort.   Last seen for f/u 10/13 for an acute visit. Noting increased fatigue and dyspnea. Using rescue inhaler more frequently. ReDS 40%. Volume appeared up. Given 20 mg furosemide X 3 days followed by PRN.   CPX 05/07/21: Low normal functional capacity. Ventilatory limitation at peak exercise with moderate obstruction on resting spirometry. No obvious HF limitation.  He is here today for 1 month follow-up. Continues to have some dyspnea with exertion. Reports he was recently in [place] and had to use his albuterol every day. Does believe symbicort is helping with his dyspnea. No lower extremity edema or weight gain. Lost a couple of pounds but did not notice much change in dyspnea with diuretic. Has occasional dizziness that is not positional. Room may spin at times.   Taking all medications.  ECG today: Sinus rhythm with rate 75 bpm  PMH: 1. Asthma: since his 85s.  2. Colon cancer: s/p sigmoid colectomy in 2015.  3. Hyperlipidemia 4. GERD 5. CAD: Anterior STEMI in 10/15, LHC showed total occlusion mid LAD and chronic total  occlusion mid LCx with collaterals.  He had Xience DES to the LAD.   - Cardiolite (5/17) with fixed apical inferior defect, no ischemia.  Low risk study.  EF 58%.  - LHC (10/19) done for angina: CTO proximal LCx with collaterals, 60% pLAD, 90% D1 stenosis.  He had DES to D1.  - LHC (1/22): done for chest tightness/dyspnea. Known occluded LCx with collaterals from the RCA. Stable nonobstructive disease in the LAD and RCA.  6. Ischemic cardiomyopathy: Echo (10/15) with EF 45-50%, anterior/anteroseptal/apical severe hypokinesis.  Echo (1/16) with EF 55-60%.  - Echo (12/19): EF 55-60%, mild Jerry.  - Echo (01/22): EF 55-60% with normal wall motion.  - CPX (10/22): Low normal functional capacity. Ventilatory limitation at peak exercise with moderate obstruction on resting spirometry. No obvious HF limitation noted.  7. Right inguinal hernia repair 8/20  SH: [employer], married Jerry Montoya), nonsmoker.   FH: Father with CAD.   ROS: All systems reviewed and negative except as per HPI.   Current Outpatient Medications  Medication Sig Dispense Refill   acetaminophen (TYLENOL) 500 MG tablet Take 500 mg by mouth every 8 (eight) hours as needed for mild pain.      aspirin EC 81 MG tablet Take 1 tablet (81 mg total) by mouth daily. Swallow whole. 30 tablet 11   budesonide-formoterol (SYMBICORT) 160-4.5 MCG/ACT inhaler Inhale 2 puffs into the lungs 2 (two) times daily. 1 each 6   clopidogrel (  PLAVIX) 75 MG tablet Take 1 tablet (75 mg total) by mouth daily. 90 tablet 3   EPIPEN 2-PAK 0.3 MG/0.3ML SOAJ injection Inject 0.3 mg into the skin once as needed for anaphylaxis.   0   furosemide (LASIX) 20 MG tablet Take 1 tablet (20 mg total) by mouth daily as needed for fluid or edema. 30 tablet 11   levalbuterol (XOPENEX HFA) 45 MCG/ACT inhaler Inhale 1-2 puffs into the lungs every 6 (six) hours as needed for wheezing. 1 each 12   losartan (COZAAR) 50 MG tablet Take 1.5 tablets (75 mg total) by mouth daily. 45  tablet 3   nitroGLYCERIN (NITROSTAT) 0.4 MG SL tablet DISSOLVE ONE TABLET UNDER THE TONGUE EVERY 5 MINUTES AS NEEDED FOR CHEST PAIN.  DO NOT EXCEED A TOTAL OF 3 DOSES IN 15 MINUTES 25 tablet 0   pantoprazole (PROTONIX) 40 MG tablet TAKE 1 TABLET BY MOUTH  DAILY 90 tablet 3   potassium chloride SA (KLOR-CON) 20 MEQ tablet Take 20 mEq by mouth daily.     rosuvastatin (CRESTOR) 40 MG tablet Take 1 tablet by mouth once daily 90 tablet 0   No current facility-administered medications for this encounter.   Wt Readings from Last 3 Encounters:  06/03/21 78 kg (172 lb)  05/02/21 78.4 kg (172 lb 12.8 oz)  09/05/20 79.1 kg (174 lb 7.2 oz)   BP 130/70   Pulse 77   Wt 78 kg (172 lb)   SpO2 96%   BMI 24.68 kg/m   General:  Well appearing. No resp difficulty HEENT: normal Neck: supple. JVP flat. Carotids 2+ bilat; no bruits.  Cor: PMI nondisplaced. Regular rate & rhythm. No rubs, gallops or murmurs. Lungs: diminished Abdomen: soft, nontender, nondistended. No hepatosplenomegaly.  Extremities: no cyanosis, clubbing, rash, edema Neuro: alert & orientedx3, cranial nerves grossly intact. moves all 4 extremities w/o difficulty. Affect pleasant   Assessment/Plan: 1. CAD: s/p anterior STEMI 10/15 with DES to mLAD.  Also had CTO mid LCx with collaterals that was not treated percutaneously.  In 10/19, he had crescendo angina.  Cath with 90% D1 stenosis, had DES to D1.   - Echo 1/22 EF 55-60%.  LHC 1/22 showed known occluded LCx with collaterals from the RCA; stable nonobstructive disease in the LAD & RCA.  Previous exertional dyspnea and chest pain was likely due to poorly controlled asthma. - No CP.  Suspect current dyspnea with exertion likely d/t underlying asthma. Recent CPX testing did not suggest HF limitation. Using albuterol daily. Recommended f/u with his Pulmonologist. - Continue ASA 81 mg daily and Plavix 75 mg daily.  - He is off bisoprolol with poor asthma control.  - Continue losartan 75 mg  daily  - Continue Crestor 40 mg daily  2. Ischemic cardiomyopathy with recovery in LV function: EF 45-50% on initial echo, improved to 55-60% in 1/16.  Echo 12/19 with EF 55-60%.  Echo 1/22 showed EF 55-60% with normal RV.  - CPX 10/22: Low normal functional capacity. Ventilatory limitation at peak exercise with moderate obstruction on resting spirometry. No obvious HF limitation noted. - ReDS 40%. However, does not appear volume overloaded on exam. Do not believe his dyspnea is due to HF as above. - Start lasix 20 mg daily x 3 days with 20 KCl, then PRN for weight gain/edema. - Continue losartan 75 mg daily. Encouraged to take daily. - Off bisoprolol with previous wheezing/asthma.  3. Hyperlipidemia: Continue Crestor 40 mg daily  4. HTN: BP upper normal today.  Continue Losartan. Dizziness does not appear to be due to hypotension. Suspect possible vertigo.  5. Asthma: Follow-up with Dr. Lamonte Sakai as above  6. Snoring/daytime fatigue: Awaiting sleep study  Follow-up 6 months with Dr. Aundra Dubin  [facility], [facility] PA-C 06/03/2021 2:56 PM  Patient seen with PA, agree with the above note.   He continues to have episodic wheezing and exertional dyspnea, no chest pain.  CPX showed obstructive lung disease, no HF limitation.   ECG (personally reviewed): NSR, normal  General: NAD Neck: No JVD, no thyromegaly or thyroid nodule.  Lungs: Prolonged expiratory phase.  CV: Nondisplaced PMI.  Heart regular S1/S2, no S3/S4, no murmur.  No peripheral edema.  No carotid bruit.  Normal pedal pulses.  Abdomen: Soft, nontender, no hepatosplenomegaly, no distention.  Skin: Intact without lesions or rashes.  Neurologic: Alert and oriented x 3.  Psych: Normal affect. Extremities: No clubbing or cyanosis.  HEENT: Normal.   I think that Jerry Montoya is stable from a cardiac perspective.  I suspect that his dyspnea is related to asthma.  He is a nonsmoker but has a significant obstructive defect on recent CPX.  -  Followup with pulmonary for asthma treatment.   He still needs to do sleep study.   Followup in 6 months with me.   Loralie Champagne 06/04/2021

## 2021-06-03 NOTE — Patient Instructions (Addendum)
Please complete your home sleep study  Please follow-up with Dr Lamonte Sakai in Pulmonology  Your physician recommends that you schedule a follow-up appointment in: 6 months (May 2023), **PLEASE CALL OUR OFFICE IN MARCH TO SCHEDULE THIS APPOINTMENT  If you have any questions or concerns before your next appointment please send [place] a message through [place] or call our office at [phone].    TO LEAVE A MESSAGE FOR THE NURSE SELECT OPTION 2, PLEASE LEAVE A MESSAGE INCLUDING: YOUR NAME DATE OF BIRTH CALL BACK NUMBER REASON FOR CALL**this is important as we prioritize the call backs  YOU WILL RECEIVE A CALL BACK THE SAME DAY AS LONG AS YOU CALL BEFORE 4:00 PM  At the [facility], you and your health needs are our priority. As part of our continuing mission to provide you with exceptional heart care, we have created designated Provider Care Teams. These Care Teams include your primary Cardiologist (physician) and Advanced Practice Providers (APPs- Physician Assistants and Nurse Practitioners) who all work together to provide you with the care you need, when you need it.   You may see any of the following providers on your designated Care Team at your next follow up: Dr Glori Bickers Dr Haynes Kerns, NP Lyda Jester, [place] [facility] [place], [place] Audry Riles, PharmD   Please be sure to bring in all your medications bottles to every appointment.

## 2021-06-04 DIAGNOSIS — Z1389 Encounter for screening for other disorder: Secondary | ICD-10-CM | POA: Diagnosis not present

## 2021-06-04 DIAGNOSIS — Z79899 Other long term (current) drug therapy: Secondary | ICD-10-CM | POA: Diagnosis not present

## 2021-06-04 DIAGNOSIS — N521 Erectile dysfunction due to diseases classified elsewhere: Secondary | ICD-10-CM | POA: Diagnosis not present

## 2021-06-04 DIAGNOSIS — Z125 Encounter for screening for malignant neoplasm of prostate: Secondary | ICD-10-CM | POA: Diagnosis not present

## 2021-06-04 DIAGNOSIS — Z1159 Encounter for screening for other viral diseases: Secondary | ICD-10-CM | POA: Diagnosis not present

## 2021-06-04 DIAGNOSIS — E538 Deficiency of other specified B group vitamins: Secondary | ICD-10-CM | POA: Diagnosis not present

## 2021-06-04 DIAGNOSIS — K219 Gastro-esophageal reflux disease without esophagitis: Secondary | ICD-10-CM | POA: Diagnosis not present

## 2021-06-04 DIAGNOSIS — E7800 Pure hypercholesterolemia, unspecified: Secondary | ICD-10-CM | POA: Diagnosis not present

## 2021-06-04 DIAGNOSIS — Z85038 Personal history of other malignant neoplasm of large intestine: Secondary | ICD-10-CM | POA: Diagnosis not present

## 2021-06-04 DIAGNOSIS — Z0000 Encounter for general adult medical examination without abnormal findings: Secondary | ICD-10-CM | POA: Diagnosis not present

## 2021-06-04 DIAGNOSIS — J4540 Moderate persistent asthma, uncomplicated: Secondary | ICD-10-CM | POA: Diagnosis not present

## 2021-06-06 ENCOUNTER — Telehealth (HOSPITAL_COMMUNITY): Payer: Self-pay | Admitting: Surgery

## 2021-06-06 NOTE — Telephone Encounter (Signed)
I contacted patient to remind him of the ordered home sleep study.  He tells me that there was a death in his family and that he has not yet had time to do the study.  He plans to complete very soon.

## 2021-07-18 ENCOUNTER — Telehealth (HOSPITAL_COMMUNITY): Payer: Self-pay | Admitting: Surgery

## 2021-07-18 ENCOUNTER — Telehealth: Payer: Self-pay | Admitting: *Deleted

## 2021-07-18 DIAGNOSIS — R1319 Other dysphagia: Secondary | ICD-10-CM | POA: Diagnosis not present

## 2021-07-18 DIAGNOSIS — I25119 Atherosclerotic heart disease of native coronary artery with unspecified angina pectoris: Secondary | ICD-10-CM | POA: Diagnosis not present

## 2021-07-18 DIAGNOSIS — Z85038 Personal history of other malignant neoplasm of large intestine: Secondary | ICD-10-CM | POA: Diagnosis not present

## 2021-07-18 DIAGNOSIS — K921 Melena: Secondary | ICD-10-CM | POA: Diagnosis not present

## 2021-07-18 DIAGNOSIS — K219 Gastro-esophageal reflux disease without esophagitis: Secondary | ICD-10-CM | POA: Diagnosis not present

## 2021-07-18 DIAGNOSIS — J4540 Moderate persistent asthma, uncomplicated: Secondary | ICD-10-CM | POA: Diagnosis not present

## 2021-07-18 NOTE — Telephone Encounter (Signed)
Ok to hold Plavix for procedure.

## 2021-07-18 NOTE — Telephone Encounter (Signed)
PT IS FOLLOWED BY THE [facility]. I WILL FORWARD CLEARANCE TO [facility].    Pre-operative Risk Assessment    Patient Name: Jerry Montoya  DOB: 02-26-1956 MRN: [id number]      Request for Surgical Clearance    Procedure:   ENDOSCOPY  Date of Surgery:  Clearance 07/26/21                                 Surgeon:  DR. Arta Silence Surgeon's Group or Practice Name:  [facility] Phone number:  [phone] Fax number:  [phone]   Type of Clearance Requested:   - Medical  - Pharmacy:  Hold Clopidogrel (Plavix)     Type of Anesthesia:  Not Indicated (PROPOFOL?)   Additional requests/questions:    Jiles Prows   07/18/2021, 3:27 PM

## 2021-07-18 NOTE — Telephone Encounter (Signed)
Information sent for patient to be charged for home sleep study device.  Patient has not completed the study nor returned the device after several reminder phone calls. °

## 2021-07-29 NOTE — Telephone Encounter (Signed)
OK for procedure.

## 2021-07-31 NOTE — Telephone Encounter (Signed)
faxed

## 2021-08-07 DIAGNOSIS — R1310 Dysphagia, unspecified: Secondary | ICD-10-CM | POA: Diagnosis not present

## 2021-08-07 DIAGNOSIS — K649 Unspecified hemorrhoids: Secondary | ICD-10-CM | POA: Diagnosis not present

## 2021-08-07 DIAGNOSIS — K222 Esophageal obstruction: Secondary | ICD-10-CM | POA: Diagnosis not present

## 2021-08-07 DIAGNOSIS — Z85038 Personal history of other malignant neoplasm of large intestine: Secondary | ICD-10-CM | POA: Diagnosis not present

## 2021-08-07 DIAGNOSIS — K921 Melena: Secondary | ICD-10-CM | POA: Diagnosis not present

## 2021-08-10 ENCOUNTER — Other Ambulatory Visit: Payer: Self-pay | Admitting: Emergency Medicine

## 2021-08-26 DIAGNOSIS — I10 Essential (primary) hypertension: Secondary | ICD-10-CM | POA: Diagnosis not present

## 2021-08-26 DIAGNOSIS — E785 Hyperlipidemia, unspecified: Secondary | ICD-10-CM | POA: Diagnosis not present

## 2021-08-26 DIAGNOSIS — Z125 Encounter for screening for malignant neoplasm of prostate: Secondary | ICD-10-CM | POA: Diagnosis not present

## 2021-08-26 DIAGNOSIS — R7303 Prediabetes: Secondary | ICD-10-CM | POA: Diagnosis not present

## 2021-08-27 DIAGNOSIS — Z0000 Encounter for general adult medical examination without abnormal findings: Secondary | ICD-10-CM | POA: Diagnosis not present

## 2021-08-27 DIAGNOSIS — R7303 Prediabetes: Secondary | ICD-10-CM | POA: Diagnosis not present

## 2021-08-27 DIAGNOSIS — R82998 Other abnormal findings in urine: Secondary | ICD-10-CM | POA: Diagnosis not present

## 2021-08-27 DIAGNOSIS — E785 Hyperlipidemia, unspecified: Secondary | ICD-10-CM | POA: Diagnosis not present

## 2021-08-27 DIAGNOSIS — I10 Essential (primary) hypertension: Secondary | ICD-10-CM | POA: Diagnosis not present

## 2021-08-27 DIAGNOSIS — R8281 Pyuria: Secondary | ICD-10-CM | POA: Diagnosis not present

## 2021-08-27 DIAGNOSIS — I2510 Atherosclerotic heart disease of native coronary artery without angina pectoris: Secondary | ICD-10-CM | POA: Diagnosis not present

## 2021-08-27 DIAGNOSIS — I700 Atherosclerosis of aorta: Secondary | ICD-10-CM | POA: Diagnosis not present

## 2021-08-27 DIAGNOSIS — K222 Esophageal obstruction: Secondary | ICD-10-CM | POA: Diagnosis not present

## 2021-08-27 DIAGNOSIS — K219 Gastro-esophageal reflux disease without esophagitis: Secondary | ICD-10-CM | POA: Diagnosis not present

## 2021-08-27 DIAGNOSIS — I5189 Other ill-defined heart diseases: Secondary | ICD-10-CM | POA: Diagnosis not present

## 2021-08-27 DIAGNOSIS — I255 Ischemic cardiomyopathy: Secondary | ICD-10-CM | POA: Diagnosis not present

## 2021-09-08 ENCOUNTER — Other Ambulatory Visit (HOSPITAL_COMMUNITY): Payer: Self-pay | Admitting: Cardiology

## 2021-09-09 DIAGNOSIS — H524 Presbyopia: Secondary | ICD-10-CM | POA: Diagnosis not present

## 2021-09-09 DIAGNOSIS — H43813 Vitreous degeneration, bilateral: Secondary | ICD-10-CM | POA: Diagnosis not present

## 2021-09-09 DIAGNOSIS — H2513 Age-related nuclear cataract, bilateral: Secondary | ICD-10-CM | POA: Diagnosis not present

## 2021-10-16 ENCOUNTER — Encounter: Payer: Self-pay | Admitting: Emergency Medicine

## 2021-10-16 ENCOUNTER — Ambulatory Visit: Payer: Medicare HMO | Admitting: Emergency Medicine

## 2021-10-16 DIAGNOSIS — J301 Allergic rhinitis due to pollen: Secondary | ICD-10-CM | POA: Diagnosis not present

## 2021-10-16 DIAGNOSIS — J309 Allergic rhinitis, unspecified: Secondary | ICD-10-CM | POA: Insufficient documentation

## 2021-10-16 DIAGNOSIS — J45909 Unspecified asthma, uncomplicated: Secondary | ICD-10-CM | POA: Diagnosis not present

## 2021-10-16 DIAGNOSIS — K219 Gastro-esophageal reflux disease without esophagitis: Secondary | ICD-10-CM

## 2021-10-16 MED ORDER — LORATADINE 10 MG PO TABS: 10.0000 mg | ORAL_TABLET | Freq: Every day | ORAL | 11 refills | Status: DC

## 2021-10-16 MED ORDER — SYMBICORT 160-4.5 MCG/ACT IN AERO: 2.0000 | INHALATION_SPRAY | Freq: Two times a day (BID) | RESPIRATORY_TRACT | 11 refills | Status: DC

## 2021-10-16 MED ORDER — FLUTICASONE PROPIONATE 50 MCG/ACT NA SUSP: 1.0000 | Freq: Every day | NASAL | 2 refills | Status: DC

## 2021-10-16 NOTE — Assessment & Plan Note (Signed)
Continue your pantoprazole 40 mg once daily.  Take this medication 1 hour around food. 

## 2021-10-16 NOTE — Assessment & Plan Note (Signed)
Averages 2 flares a year.  On exam today he has principally upper airway noise, no wheezing.  I suspect he has a crossover between upper and lower airways disease.  He has benefited from Symbicort and we will plan to continue.  Try to control rhinitis and GERD as well as possible since they are contributors. ? ?Please continue Symbicort 2 puffs twice a day.  Rinse and gargle after using. ?Keep your Xopenex available to use either 2 puffs or 1 nebulizer treatment up to every 4 hours if needed for shortness of breath, chest tightness, wheezing. ?Follow Dr. Lamonte Sakai in October or sooner if you have any problems. ?

## 2021-10-16 NOTE — Progress Notes (Signed)
? ?Subjective:  ? ? Patient ID: Jerry Montoya, male    DOB: Sep 11, 1955, 66 y.o.   MRN: [id number] ? ?HPI ? ?ROV 09/05/20 --66 year old never smoker with a history of CAD and ischemic cardiomyopathy (stable on recent catheterization), colon cancer, hypertension, longstanding asthma diagnosed in his 29s.  I met him for increased dyspnea last month and we tried changing his Flovent to Symbicort to see if he would get benefit.  Also continue pantoprazole, discussed possibly starting an antihistamine but deferred. He was unfortunately dx with COVID in late January, now feels back to baseline. He feels that he benefited from the symbicort, breathing is better. xopenex use has decreased significantly. Exercise tolerance is OK.  ?COVID vaccine is up to date.  ? ?ROV 10/16/21 --66 year old man, never smoker whom I follow for longstanding asthma.  He has a history of CAD with ischemic cardiomyopathy, colon cancer, hypertension, GERD.  Currently managed on Symbicort, uses Xopenex as needed.  On pantoprazole. ?Today he reports that he has been doing well - has some ups and downs with his breathing. He has a lot of throat congestion, morning and night. Tolerating the symbicort. He is using xopenex about 5 time a week. Usually for SOB. Hears wheeze with strenuous activity, also morning and evening. He is able to walk 2 miles a day. He was treated with pred taper over a month ago, unclear trigger but had severe SOB . He averages prednisone about 2x a year.   ? ? ? ? ?Review of Systems ?As per HPI ? ? ?   ?Objective:  ? Physical Exam ?Vitals:  ? 10/16/21 1615  ?BP: 128/76  ?Pulse: 81  ?Temp: 98.5 ?F (36.9 ?C)  ?TempSrc: Oral  ?SpO2: 95%  ?Weight: 170 lb 12.8 oz (77.5 kg)  ?Height: _0  (1.778 m)  ? ?Gen: Pleasant, well-nourished, in no distress,  normal affect ? ?ENT: No lesions,  mouth clear,  oropharynx clear, no postnasal drip ? ?Neck: No JVD, stridor on exp ? ?Lungs: No use of accessory muscles, no crackles or wheezing on  normal respiration, no wheeze on forced expiration, referred UA noise ? ?Cardiovascular: RRR, heart sounds normal, no murmur or gallops, no peripheral edema ? ?Musculoskeletal: No deformities, no cyanosis or clubbing ? ?Neuro: alert, awake, non focal ? ?Skin: Warm, no lesions or rash ? ? ?   ?Assessment & Plan:  ?Asthma ?Averages 2 flares a year.  On exam today he has principally upper airway noise, no wheezing.  I suspect he has a crossover between upper and lower airways disease.  He has benefited from Symbicort and we will plan to continue.  Try to control rhinitis and GERD as well as possible since they are contributors. ? ?Please continue Symbicort 2 puffs twice a day.  Rinse and gargle after using. ?Keep your Xopenex available to use either 2 puffs or 1 nebulizer treatment up to every 4 hours if needed for shortness of breath, chest tightness, wheezing. ?Follow Dr. Lamonte Sakai in October or sooner if you have any problems. ? ?GERD (gastroesophageal reflux disease) ?Continue your pantoprazole 40 mg once daily.  Take this medication 1 hour around food. ? ?Allergic rhinitis ?Agree with restarting your fluticasone (Flonase) nasal spray, 2 sprays each nostril once daily.  Take this reliably at least through the allergy season ?Start loratadine 10 mg once daily (generic Claritin).  Take this through the allergy season ? ?Baltazar Apo, MD, PhD ?10/16/2021, 4:52 PM ?[facility] ?[phone] or if no answer  [phone] ? ?

## 2021-10-16 NOTE — Patient Instructions (Signed)
Please continue Symbicort 2 puffs twice a day.  Rinse and gargle after using. ?Keep your Xopenex available to use either 2 puffs or 1 nebulizer treatment up to every 4 hours if needed for shortness of breath, chest tightness, wheezing. ?Continue your pantoprazole 40 mg once daily.  Take this medication 1 hour around food. ?Agree with restarting your fluticasone (Flonase) nasal spray, 2 sprays each nostril once daily.  Take this reliably at least through the allergy season ?Start loratadine 10 mg once daily (generic Claritin).  Take this through the allergy season ?Follow Dr. Lamonte Sakai in October or sooner if you have any problems. ?

## 2021-10-16 NOTE — Addendum Note (Signed)
Addended by: Gavin Potters R on: 10/16/2021 04:55 PM ? ? Modules accepted: Orders ? ?

## 2021-10-16 NOTE — Assessment & Plan Note (Signed)
Agree with restarting your fluticasone (Flonase) nasal spray, 2 sprays each nostril once daily.  Take this reliably at least through the allergy season ?Start loratadine 10 mg once daily (generic Claritin).  Take this through the allergy season ?

## 2021-10-24 DIAGNOSIS — R400 Somnolence: Secondary | ICD-10-CM

## 2021-10-28 DIAGNOSIS — M546 Pain in thoracic spine: Secondary | ICD-10-CM | POA: Diagnosis not present

## 2021-11-08 DIAGNOSIS — Z1152 Encounter for screening for COVID-19: Secondary | ICD-10-CM | POA: Diagnosis not present

## 2021-11-08 DIAGNOSIS — R062 Wheezing: Secondary | ICD-10-CM | POA: Diagnosis not present

## 2021-11-08 DIAGNOSIS — J069 Acute upper respiratory infection, unspecified: Secondary | ICD-10-CM | POA: Diagnosis not present

## 2021-11-08 DIAGNOSIS — R0981 Nasal congestion: Secondary | ICD-10-CM | POA: Diagnosis not present

## 2021-11-08 DIAGNOSIS — R051 Acute cough: Secondary | ICD-10-CM | POA: Diagnosis not present

## 2021-11-08 DIAGNOSIS — J4530 Mild persistent asthma, uncomplicated: Secondary | ICD-10-CM | POA: Diagnosis not present

## 2021-11-08 DIAGNOSIS — I10 Essential (primary) hypertension: Secondary | ICD-10-CM | POA: Diagnosis not present

## 2021-11-27 DIAGNOSIS — M546 Pain in thoracic spine: Secondary | ICD-10-CM | POA: Diagnosis not present

## 2021-12-09 DIAGNOSIS — M546 Pain in thoracic spine: Secondary | ICD-10-CM | POA: Diagnosis not present

## 2021-12-17 DIAGNOSIS — M546 Pain in thoracic spine: Secondary | ICD-10-CM | POA: Diagnosis not present

## 2021-12-18 DIAGNOSIS — N521 Erectile dysfunction due to diseases classified elsewhere: Secondary | ICD-10-CM | POA: Diagnosis not present

## 2021-12-18 DIAGNOSIS — J4540 Moderate persistent asthma, uncomplicated: Secondary | ICD-10-CM | POA: Diagnosis not present

## 2021-12-24 DIAGNOSIS — M546 Pain in thoracic spine: Secondary | ICD-10-CM | POA: Diagnosis not present

## 2021-12-30 ENCOUNTER — Other Ambulatory Visit (HOSPITAL_COMMUNITY): Payer: Self-pay | Admitting: Cardiology

## 2022-01-01 DIAGNOSIS — M722 Plantar fascial fibromatosis: Secondary | ICD-10-CM | POA: Diagnosis not present

## 2022-01-09 ENCOUNTER — Other Ambulatory Visit (HOSPITAL_COMMUNITY): Payer: Self-pay | Admitting: Cardiology

## 2022-01-13 ENCOUNTER — Encounter (HOSPITAL_COMMUNITY): Payer: Self-pay

## 2022-01-13 ENCOUNTER — Ambulatory Visit (HOSPITAL_COMMUNITY)
Admission: RE | Admit: 2022-01-13 | Discharge: 2022-01-13 | Disposition: A | Discharge status: Home / Self Care | Payer: Medicare HMO | Source: Ambulatory Visit | Attending: Family Medicine | Admitting: Family Medicine

## 2022-01-13 VITALS — BP 146/78 | HR 76 | Wt 168.4 lb

## 2022-01-13 DIAGNOSIS — J45909 Unspecified asthma, uncomplicated: Secondary | ICD-10-CM | POA: Diagnosis not present

## 2022-01-13 DIAGNOSIS — I25119 Atherosclerotic heart disease of native coronary artery with unspecified angina pectoris: Secondary | ICD-10-CM

## 2022-01-13 DIAGNOSIS — Z7902 Long term (current) use of antithrombotics/antiplatelets: Secondary | ICD-10-CM | POA: Diagnosis not present

## 2022-01-13 DIAGNOSIS — E782 Mixed hyperlipidemia: Secondary | ICD-10-CM | POA: Diagnosis not present

## 2022-01-13 DIAGNOSIS — Z7982 Long term (current) use of aspirin: Secondary | ICD-10-CM | POA: Diagnosis not present

## 2022-01-13 DIAGNOSIS — Z955 Presence of coronary angioplasty implant and graft: Secondary | ICD-10-CM | POA: Insufficient documentation

## 2022-01-13 DIAGNOSIS — E785 Hyperlipidemia, unspecified: Secondary | ICD-10-CM | POA: Insufficient documentation

## 2022-01-13 DIAGNOSIS — I10 Essential (primary) hypertension: Secondary | ICD-10-CM

## 2022-01-13 DIAGNOSIS — I252 Old myocardial infarction: Secondary | ICD-10-CM | POA: Diagnosis not present

## 2022-01-13 DIAGNOSIS — Z85038 Personal history of other malignant neoplasm of large intestine: Secondary | ICD-10-CM | POA: Insufficient documentation

## 2022-01-13 DIAGNOSIS — R0683 Snoring: Secondary | ICD-10-CM | POA: Insufficient documentation

## 2022-01-13 DIAGNOSIS — R5383 Other fatigue: Secondary | ICD-10-CM | POA: Insufficient documentation

## 2022-01-13 DIAGNOSIS — Z8249 Family history of ischemic heart disease and other diseases of the circulatory system: Secondary | ICD-10-CM | POA: Insufficient documentation

## 2022-01-13 DIAGNOSIS — Z79899 Other long term (current) drug therapy: Secondary | ICD-10-CM | POA: Diagnosis not present

## 2022-01-13 DIAGNOSIS — I2510 Atherosclerotic heart disease of native coronary artery without angina pectoris: Secondary | ICD-10-CM | POA: Insufficient documentation

## 2022-01-13 DIAGNOSIS — I255 Ischemic cardiomyopathy: Secondary | ICD-10-CM | POA: Diagnosis not present

## 2022-01-13 DIAGNOSIS — M546 Pain in thoracic spine: Secondary | ICD-10-CM | POA: Diagnosis not present

## 2022-01-13 LAB — BASIC METABOLIC PANEL
Anion gap: 5 (ref 5–15)
BUN: 12 mg/dL (ref 8–23)
CO2: 25 mmol/L (ref 22–32)
Calcium: 9.4 mg/dL (ref 8.9–10.3)
Chloride: 110 mmol/L (ref 98–111)
Creatinine, Ser: 0.79 mg/dL (ref 0.61–1.24)
GFR, Estimated: >60 mL/min (ref >60)
Glucose, Bld: 99 mg/dL (ref 70–99)
Potassium: 4.4 mmol/L (ref 3.5–5.1)
Sodium: 140 mmol/L (ref 135–145)

## 2022-01-13 MED ORDER — LOSARTAN POTASSIUM 25 MG PO TABS: 25.0000 mg | ORAL_TABLET | Freq: Every day | ORAL | 3 refills | Status: DC

## 2022-01-13 MED ORDER — ROSUVASTATIN CALCIUM 40 MG PO TABS: 40.0000 mg | ORAL_TABLET | Freq: Every day | ORAL | 6 refills | Status: AC

## 2022-01-13 NOTE — Progress Notes (Signed)
PCP: Dr. Pete Glatter Cardiology: Dr. Shirlee Latch  66 y.o. with history of asthma and colon cancer was admitted in 10/15 with anterior STEMI.  He had fresh total occlusion of the mid LAD and chronic total occlusion of the mid LCx with collaterals.  He had Xience DES to LAD.  Echo that admission showed EF 45-50%.  Repeat echo in 1/16 showed EF up to 55-60%.  In 5/17, he had a Cardiolite that was low risk with a fixed apical inferior defect and no ischemia. In 10/19, he had LHC due to angina showing CTO proximal LCx with collaterals, 60% pLAD, 90% D1 stenosis.  He had DES to D1.   Patient had right inguinal hernia repair in 8/20.   Echo 12/21 showed EF 55-60% with normal wall motion.  With ongoing exertional chest pain and dyspnea, he had LHC in 1/22.  This showed stable chronically occluded LCX with nonobstructive CAD elsewhere.   He saw pulmonology and was started on Symbicort.   CPX 05/07/21: Low normal functional capacity. Ventilatory limitation at peak exercise with moderate obstruction on resting spirometry. No obvious HF limitation.  Today he returns for HF follow up. Overall feeling fine. 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 He is working out more and feels stronger. Denies palpitations, CP, dizziness, edema, or PND/Orthopnea. Appetite ok. No fever or chills. Weight at home 165-168 pounds. Has been off losartan and Crestor for awhile.  ECG (personally reviewed): NSR 76 bpm  PMH: 1. Asthma: since his 30s.  2. Colon cancer: s/p sigmoid colectomy in 2015.  3. Hyperlipidemia 4. GERD 5. CAD: Anterior STEMI in 10/15, LHC showed total occlusion mid LAD and chronic total occlusion mid LCx with collaterals.  He had Xience DES to the LAD.   - Cardiolite (5/17) with fixed apical inferior defect, no ischemia.  Low risk study.  EF 58%.  - LHC (10/19) done for angina: CTO proximal LCx with collaterals, 60% pLAD, 90% D1 stenosis.  He had DES to D1.   - LHC (1/22): done for chest tightness/dyspnea. Known occluded LCx with collaterals from the RCA. Stable nonobstructive disease in the LAD and RCA.  6. Ischemic cardiomyopathy: Echo (10/15) with EF 45-50%, anterior/anteroseptal/apical severe hypokinesis.  Echo (1/16) with EF 55-60%.  - Echo (12/19): EF 55-60%, mild MR.  - Echo (01/22): EF 55-60% with normal wall motion.  - CPX (10/22): Low normal functional capacity. Ventilatory limitation at peak exercise with moderate obstruction on resting spirometry. No obvious HF limitation noted.  7. Right inguinal hernia repair 8/20  SH: [employer], married Jerry Montoya), nonsmoker.   FH: Father with CAD.   ROS: All systems reviewed and negative except as per HPI.   Current Outpatient Medications  Medication Sig Dispense Refill   EPIPEN 2-PAK 0.3 MG/0.3ML SOAJ injection Inject 0.3 mg into the skin once as needed for anaphylaxis.   0   fluticasone (FLONASE) 50 MCG/ACT nasal spray Place 1 spray into both nostrils daily. 16 g 2   furosemide (LASIX) 20 MG tablet Take 1 tablet (20 mg total) by mouth daily as needed for fluid or edema. 30 tablet 11   levalbuterol (XOPENEX HFA) 45 MCG/ACT inhaler Inhale 1-2 puffs into the lungs every 6 (six) hours as needed for wheezing. 1 each 12   loratadine (CLARITIN) 10 MG tablet Take 1 tablet (10 mg total) by mouth daily. 30 tablet 11   nitroGLYCERIN (NITROSTAT) 0.4 MG SL tablet DISSOLVE ONE TABLET  UNDER THE TONGUE EVERY 5 MINUTES AS NEEDED FOR CHEST PAIN.  DO NOT EXCEED A TOTAL OF 3 DOSES IN 15 MINUTES 25 tablet 0   pantoprazole (PROTONIX) 40 MG tablet TAKE 1 TABLET BY MOUTH  DAILY 90 tablet 3   SYMBICORT 160-4.5 MCG/ACT inhaler Inhale 2 puffs into the lungs 2 (two) times daily. 10.2 g 11   No current facility-administered medications for this encounter.   Wt Readings from Last 3 Encounters:  01/13/22 76.4 kg (168 lb 6.4 oz)  10/16/21 77.5 kg (170 lb 12.8 oz)  06/03/21 78 kg (172 lb)   BP (!) 146/78   Pulse  76   Wt 76.4 kg (168 lb 6.4 oz)   SpO2 97%   BMI 24.16 kg/m  Physical Exam: General:  NAD. No resp difficulty HEENT: Normal Neck: Supple. No JVD. Carotids 2+ bilat; no bruits. No lymphadenopathy or thryomegaly appreciated. Cor: PMI nondisplaced. Regular rate & rhythm. No rubs, gallops or murmurs. Lungs: Clear Abdomen: Soft, nontender, nondistended. No hepatosplenomegaly. No bruits or masses. Good bowel sounds. Extremities: No cyanosis, clubbing, rash, edema Neuro: Alert & oriented x 3, cranial nerves grossly intact. Moves all 4 extremities w/o difficulty. Affect pleasant.  Assessment/Plan: 1. CAD: s/p anterior STEMI 10/15 with DES to mLAD.  Also had CTO mid LCx with collaterals that was not treated percutaneously.  In 10/19, he had crescendo angina.  Cath with 90% D1 stenosis, had DES to D1.   - Echo 1/22 EF 55-60%.  LHC 1/22 showed known occluded LCx with collaterals from the RCA; stable nonobstructive disease in the LAD & RCA.  Previous exertional dyspnea and chest pain was likely due to poorly controlled asthma. - No CP.   - Continue ASA 81 mg daily and Plavix 75 mg daily.  - He is off bisoprolol with poor asthma control.  - Restart Crestor 40 mg daily. Check lipids next visit. 2. Ischemic cardiomyopathy with recovery in LV function: EF 45-50% on initial echo, improved to 55-60% in 1/16.  Echo 12/19 with EF 55-60%.  Echo 1/22 showed EF 55-60% with normal RV. CPX 10/22: Low normal functional capacity. Ventilatory limitation at peak exercise with moderate obstruction on resting spirometry. No obvious HF limitation noted. NYHA I-II, he is not volume overloaded. - Restart losartan 25 mg daily. BMET today, repeat in 2 weeks. - Continue Lasix 20 mg + 20 KCL PRN. - Off bisoprolol with previous wheezing/asthma. 3. Hyperlipidemia: LDL 81, HDL 32 (10/22). Restart Crestor as above. 4. HTN: Elevated today. Restart losartan as above (previously on 75 mg daily). 5. Asthma: Follow-up with Dr.  Delton Coombes. 6. Snoring/daytime fatigue: Awaiting sleep study  Follow up in 3-4 months with Dr. Shirlee Latch.  Anderson [facility] FNP-BC 01/13/2022 12:13 PM

## 2022-01-27 ENCOUNTER — Other Ambulatory Visit (HOSPITAL_COMMUNITY): Payer: Medicare HMO

## 2022-01-28 ENCOUNTER — Ambulatory Visit (HOSPITAL_COMMUNITY)
Admission: RE | Admit: 2022-01-28 | Discharge: 2022-01-28 | Disposition: A | Discharge status: Home / Self Care | Payer: Medicare HMO | Source: Ambulatory Visit | Attending: Cardiology | Admitting: Cardiology

## 2022-01-28 DIAGNOSIS — I255 Ischemic cardiomyopathy: Secondary | ICD-10-CM | POA: Diagnosis not present

## 2022-01-28 LAB — BASIC METABOLIC PANEL
Anion gap: 8 (ref 5–15)
BUN: 15 mg/dL (ref 8–23)
CO2: 22 mmol/L (ref 22–32)
Calcium: 9.3 mg/dL (ref 8.9–10.3)
Chloride: 108 mmol/L (ref 98–111)
Creatinine, Ser: 0.83 mg/dL (ref 0.61–1.24)
GFR, Estimated: >60 mL/min (ref >60)
Glucose, Bld: 117 mg/dL — ABNORMAL HIGH (ref 70–99)
Potassium: 4 mmol/L (ref 3.5–5.1)
Sodium: 138 mmol/L (ref 135–145)

## 2022-03-04 DIAGNOSIS — I10 Essential (primary) hypertension: Secondary | ICD-10-CM | POA: Diagnosis not present

## 2022-03-04 DIAGNOSIS — I2510 Atherosclerotic heart disease of native coronary artery without angina pectoris: Secondary | ICD-10-CM | POA: Diagnosis not present

## 2022-03-04 DIAGNOSIS — N281 Cyst of kidney, acquired: Secondary | ICD-10-CM | POA: Diagnosis not present

## 2022-03-04 DIAGNOSIS — J4530 Mild persistent asthma, uncomplicated: Secondary | ICD-10-CM | POA: Diagnosis not present

## 2022-03-04 DIAGNOSIS — R000 Tachycardia, unspecified: Secondary | ICD-10-CM | POA: Diagnosis not present

## 2022-03-04 DIAGNOSIS — I255 Ischemic cardiomyopathy: Secondary | ICD-10-CM | POA: Diagnosis not present

## 2022-03-04 DIAGNOSIS — K222 Esophageal obstruction: Secondary | ICD-10-CM | POA: Diagnosis not present

## 2022-03-04 DIAGNOSIS — K219 Gastro-esophageal reflux disease without esophagitis: Secondary | ICD-10-CM | POA: Diagnosis not present

## 2022-03-04 DIAGNOSIS — C189 Malignant neoplasm of colon, unspecified: Secondary | ICD-10-CM | POA: Diagnosis not present

## 2022-03-04 DIAGNOSIS — R7303 Prediabetes: Secondary | ICD-10-CM | POA: Diagnosis not present

## 2022-03-04 DIAGNOSIS — E785 Hyperlipidemia, unspecified: Secondary | ICD-10-CM | POA: Diagnosis not present

## 2022-03-04 DIAGNOSIS — M8588 Other specified disorders of bone density and structure, other site: Secondary | ICD-10-CM | POA: Diagnosis not present

## 2022-04-08 DIAGNOSIS — Z7952 Long term (current) use of systemic steroids: Secondary | ICD-10-CM | POA: Diagnosis not present

## 2022-04-15 ENCOUNTER — Ambulatory Visit (HOSPITAL_COMMUNITY)
Admission: RE | Admit: 2022-04-15 | Discharge: 2022-04-15 | Disposition: A | Discharge status: Home / Self Care | Payer: Medicare HMO | Source: Ambulatory Visit | Attending: Cardiology | Admitting: Cardiology

## 2022-04-15 VITALS — BP 150/80 | HR 104 | Wt 172.7 lb

## 2022-04-15 DIAGNOSIS — E782 Mixed hyperlipidemia: Secondary | ICD-10-CM | POA: Insufficient documentation

## 2022-04-15 DIAGNOSIS — I2510 Atherosclerotic heart disease of native coronary artery without angina pectoris: Secondary | ICD-10-CM

## 2022-04-15 DIAGNOSIS — I255 Ischemic cardiomyopathy: Secondary | ICD-10-CM | POA: Insufficient documentation

## 2022-04-15 DIAGNOSIS — Z91148 Patient's other noncompliance with medication regimen for other reason: Secondary | ICD-10-CM | POA: Diagnosis not present

## 2022-04-15 DIAGNOSIS — Z79899 Other long term (current) drug therapy: Secondary | ICD-10-CM | POA: Diagnosis not present

## 2022-04-15 DIAGNOSIS — E785 Hyperlipidemia, unspecified: Secondary | ICD-10-CM | POA: Insufficient documentation

## 2022-04-15 DIAGNOSIS — Z7901 Long term (current) use of anticoagulants: Secondary | ICD-10-CM | POA: Diagnosis not present

## 2022-04-15 DIAGNOSIS — J45909 Unspecified asthma, uncomplicated: Secondary | ICD-10-CM | POA: Diagnosis not present

## 2022-04-15 DIAGNOSIS — I252 Old myocardial infarction: Secondary | ICD-10-CM | POA: Diagnosis not present

## 2022-04-15 DIAGNOSIS — I25110 Atherosclerotic heart disease of native coronary artery with unstable angina pectoris: Secondary | ICD-10-CM | POA: Insufficient documentation

## 2022-04-15 DIAGNOSIS — I110 Hypertensive heart disease with heart failure: Secondary | ICD-10-CM | POA: Diagnosis not present

## 2022-04-15 DIAGNOSIS — I5022 Chronic systolic (congestive) heart failure: Secondary | ICD-10-CM | POA: Insufficient documentation

## 2022-04-15 DIAGNOSIS — Z7902 Long term (current) use of antithrombotics/antiplatelets: Secondary | ICD-10-CM | POA: Insufficient documentation

## 2022-04-15 LAB — BASIC METABOLIC PANEL
Anion gap: 7 (ref 5–15)
BUN: 13 mg/dL (ref 8–23)
CO2: 25 mmol/L (ref 22–32)
Calcium: 9.5 mg/dL (ref 8.9–10.3)
Chloride: 107 mmol/L (ref 98–111)
Creatinine, Ser: 0.86 mg/dL (ref 0.61–1.24)
GFR, Estimated: >60 mL/min (ref >60)
Glucose, Bld: 146 mg/dL — ABNORMAL HIGH (ref 70–99)
Potassium: 3.7 mmol/L (ref 3.5–5.1)
Sodium: 139 mmol/L (ref 135–145)

## 2022-04-15 LAB — LIPID PANEL
Cholesterol: 228 mg/dL — ABNORMAL HIGH (ref 0–200)
HDL: 36 mg/dL — ABNORMAL LOW (ref >40)
LDL Cholesterol: 166 mg/dL — ABNORMAL HIGH (ref 0–99)
Total CHOL/HDL Ratio: 6.3 RATIO
Triglycerides: 128 mg/dL (ref <150)
VLDL: 26 mg/dL (ref 0–40)

## 2022-04-15 MED ORDER — CLOPIDOGREL BISULFATE 75 MG PO TABS
75.0000 mg | ORAL_TABLET | Freq: Every day | ORAL | 3 refills | Status: AC
Start: 2022-04-15 — End: ?

## 2022-04-15 MED ORDER — LOSARTAN POTASSIUM 25 MG PO TABS: 75.0000 mg | ORAL_TABLET | Freq: Every day | ORAL | 3 refills | Status: DC

## 2022-04-15 NOTE — Progress Notes (Signed)
PCP: Dr. Felipa Eth Cardiology: Dr. Aundra Dubin  66 y.o. with history of asthma and colon cancer was admitted in 10/15 with anterior STEMI.  He had fresh total occlusion of the mid LAD and chronic total occlusion of the mid LCx with collaterals.  He had Xience DES to LAD.  Echo that admission showed EF 45-50%.  Repeat echo in 1/16 showed EF up to 55-60%.  In 5/17, he had a Cardiolite that was low risk with a fixed apical inferior defect and no ischemia. In 10/19, he had LHC due to angina showing CTO proximal LCx with collaterals, 60% pLAD, 90% D1 stenosis.  He had DES to D1.   Patient had right inguinal hernia repair in 8/20.   Echo 12/21 showed EF 55-60% with normal wall motion.  With ongoing exertional chest pain and dyspnea, he had LHC in 1/22.  This showed stable chronically occluded LCX with nonobstructive CAD elsewhere.   He saw pulmonology and was started on Symbicort.   Last seen for f/u 10/13 for an acute visit. Noting increased fatigue and dyspnea. Using rescue inhaler more frequently. ReDS 40%. Volume appeared up. Given 20 mg furosemide X 3 days followed by PRN.   CPX 05/07/21: Low normal functional capacity. Ventilatory limitation at peak exercise with moderate obstruction on resting spirometry. No obvious HF limitation.  Patient returns for followup of CAD.  He has been exercising more.  Still wheezes at times due to asthma.  Generally, no exertional dyspnea unless he is having an asthma flare.  No chest pain.  No lightheadedness or palpitations.  He has been taking care of his grandchildren during the day.  Compliance with medications is questionable.  He says his goal is to "get off all the medications."  He has stopped Plavix.  He is taking losartan 50 mg daily.  I am not sure that he is actually taking Crestor.  Based on LDL today, I doubt. SBP 130s-140s at home.   Labs (7/23): K 4, creatinine 0.83 Labs (9/23): LDL 166  ECG (personally reviewed): NSR, LVH  PMH: 1. Asthma: since  his 29s.  2. Colon cancer: s/p sigmoid colectomy in 2015.  3. Hyperlipidemia 4. GERD 5. CAD: Anterior STEMI in 10/15, LHC showed total occlusion mid LAD and chronic total occlusion mid LCx with collaterals.  He had Xience DES to the LAD.   - Cardiolite (5/17) with fixed apical inferior defect, no ischemia.  Low risk study.  EF 58%.  - LHC (10/19) done for angina: CTO proximal LCx with collaterals, 60% pLAD, 90% D1 stenosis.  He had DES to D1.  - LHC (1/22): done for chest tightness/dyspnea. Known occluded LCx with collaterals from the RCA. Stable nonobstructive disease in the LAD and RCA.  6. Ischemic cardiomyopathy: Echo (10/15) with EF 45-50%, anterior/anteroseptal/apical severe hypokinesis.  Echo (1/16) with EF 55-60%.  - Echo (12/19): EF 55-60%, mild MR.  - Echo (01/22): EF 55-60% with normal wall motion.  - CPX (10/22): Low normal functional capacity. Ventilatory limitation at peak exercise with moderate obstruction on resting spirometry. No obvious HF limitation noted.  7. Right inguinal hernia repair 8/20  SH: [employer], married Ferd Horrigan), nonsmoker.   FH: Father with CAD.   ROS: All systems reviewed and negative except as per HPI.   Current Outpatient Medications  Medication Sig Dispense Refill   clopidogrel (PLAVIX) 75 MG tablet Take 1 tablet (75 mg total) by mouth daily. 90 tablet 3   EPIPEN 2-PAK 0.3 MG/0.3ML SOAJ injection Inject 0.3  mg into the skin once as needed for anaphylaxis.   0   fluticasone (FLONASE) 50 MCG/ACT nasal spray Place 1 spray into both nostrils daily. 16 g 2   levalbuterol (XOPENEX HFA) 45 MCG/ACT inhaler Inhale 1-2 puffs into the lungs every 6 (six) hours as needed for wheezing. 1 each 12   loratadine (CLARITIN) 10 MG tablet Take 1 tablet (10 mg total) by mouth daily. 30 tablet 11   nitroGLYCERIN (NITROSTAT) 0.4 MG SL tablet DISSOLVE ONE TABLET UNDER THE TONGUE EVERY 5 MINUTES AS NEEDED FOR CHEST PAIN.  DO NOT EXCEED A TOTAL OF 3 DOSES IN 15 MINUTES  25 tablet 0   pantoprazole (PROTONIX) 40 MG tablet TAKE 1 TABLET BY MOUTH  DAILY 90 tablet 3   rosuvastatin (CRESTOR) 40 MG tablet Take 1 tablet (40 mg total) by mouth daily. 30 tablet 6   SYMBICORT 160-4.5 MCG/ACT inhaler Inhale 2 puffs into the lungs 2 (two) times daily. 10.2 g 11   furosemide (LASIX) 20 MG tablet Take 1 tablet (20 mg total) by mouth daily as needed for fluid or edema. (Patient not taking: Reported on 04/15/2022) 30 tablet 11   losartan (COZAAR) 25 MG tablet Take 3 tablets (75 mg total) by mouth daily. 180 tablet 3   No current facility-administered medications for this encounter.   Wt Readings from Last 3 Encounters:  04/15/22 78.3 kg (172 lb 11.2 oz)  01/13/22 76.4 kg (168 lb 6.4 oz)  10/16/21 77.5 kg (170 lb 12.8 oz)   BP (!) 150/80   Pulse (!) 104   Wt 78.3 kg (172 lb 11.2 oz)   SpO2 96%   BMI 24.78 kg/m  General: NAD Neck: No JVD, no thyromegaly or thyroid nodule.  Lungs: Clear to auscultation bilaterally with normal respiratory effort. CV: Nondisplaced PMI.  Heart regular S1/S2, no S3/S4, no murmur.  No peripheral edema.  No carotid bruit.  Normal pedal pulses.  Abdomen: Soft, nontender, no hepatosplenomegaly, no distention.  Skin: Intact without lesions or rashes.  Neurologic: Alert and oriented x 3.  Psych: Normal affect. Extremities: No clubbing or cyanosis.  HEENT: Normal.   Assessment/Plan: 1. CAD: s/p anterior STEMI 10/15 with DES to mLAD.  Also had CTO mid LCx with collaterals that was not treated percutaneously.  In 10/19, he had crescendo angina.  Cath with 90% D1 stenosis, had DES to D1.  Echo 1/22 EF 55-60%.  LHC 1/22 showed known occluded LCx with collaterals from the RCA; stable nonobstructive disease in the LAD & RCA.  No recent chest pain.  - He is not taking ASA or Plavix.  I think that with extensive CAD and with most recent data, taking Plavix 75 mg daily alone is reasonable.  He needs to restart this.  - He is off bisoprolol with poor  asthma control.  - Based on today's marked jump in LDL to 166, I suspect he is not taking Crestor.  He needs to restart this with lipids/LFTs in 2 months.  2. Ischemic cardiomyopathy with recovery in LV function: EF 45-50% on initial echo, improved to 55-60% in 1/16.  Echo 12/19 with EF 55-60%.  Echo 1/22 showed EF 55-60% with normal RV.  CPX 10/22 showed low normal functional capacity. Ventilatory limitation at peak exercise with moderate obstruction on resting spirometry. No obvious HF limitation noted.  Suspect his dyspnea is primarily due to asthma.  - Increase losartan to 75 mg daily today.  BMET today.  - Off bisoprolol with previous wheezing/asthma. 3. Hyperlipidemia:  LDL markedly high today, he is likely not taking his Crestor.  Goal LDL < 55, he is at 166.  - Restart Crestor 40 mg daily and check lipids/LFTs in 2 months.  4. HTN: BP elevated today.  He is taking losartan 50 mg daily where he used to take 75 mg daily.  I suspect he has cut this med back on his own also.  - Need SBP < 130, increase losartan to 75 mg daily.  Check BP daily and record, followup with HF pharmacist in 1 month for BP med adjustment.  5. Asthma: Follow-up with Dr. Lamonte Sakai.  6. Medication noncompliance: Mr Fatima stated goal is to get off all his medications.  I told him that this is really not a reasonable goal for a person with extensive CAD if that person wishes to avoid future MI/cardiac death. He is obviously not taking his statin and has inexplicably stopped his antiplatelet agent as well.  We have been over all this in the past and hopefully at some point this will stick before he has another MI.   Followup in 1 month with HF pharmacist for BP med titration, see me in 6 months.   Loralie Champagne 04/15/2022

## 2022-04-15 NOTE — Patient Instructions (Signed)
Start Plavix 75 mg daily.  Increase Losartan to 75 mg daily.  Labs done today, your results will be available in MyChart, we will contact you for abnormal readings.  Repeat blood work in 10 days.  Please follow up with our heart failure pharmacist in 1 month  Your physician recommends that you schedule a follow-up appointment in: 6 months ( March 2024)  ** please call the office in January to arrange your follow up appointment **  If you have any questions or concerns before your next appointment please send [place] a message through [place] or call our office at [phone].    TO LEAVE A MESSAGE FOR THE NURSE SELECT OPTION 2, PLEASE LEAVE A MESSAGE INCLUDING: YOUR NAME DATE OF BIRTH CALL BACK NUMBER REASON FOR CALL**this is important as we prioritize the call backs  YOU WILL RECEIVE A CALL BACK THE SAME DAY AS LONG AS YOU CALL BEFORE 4:00 PM  At the [facility], you and your health needs are our priority. As part of our continuing mission to provide you with exceptional heart care, we have created designated Provider Care Teams. These Care Teams include your primary Cardiologist (physician) and Advanced Practice Providers (APPs- Physician Assistants and Nurse Practitioners) who all work together to provide you with the care you need, when you need it.   You may see any of the following providers on your designated Care Team at your next follow up: Dr Glori Bickers Dr Loralie Champagne Dr. Roxana Hires, NP Lyda Jester, [place] [facility], [place] Forestine Na, NP Audry Riles, PharmD   Please be sure to bring in all your medications bottles to every appointment.

## 2022-04-16 ENCOUNTER — Telehealth (HOSPITAL_COMMUNITY): Payer: Self-pay | Admitting: Surgery

## 2022-04-16 DIAGNOSIS — E782 Mixed hyperlipidemia: Secondary | ICD-10-CM

## 2022-04-16 NOTE — Telephone Encounter (Signed)
-----   Message from Larey Dresser, MD sent at 04/15/2022  4:47 PM EDT ----- Looks like he is not taking his Crestor.  Very important to take this unless he would like another heart attack.  Please start now.  Check lipids/LFTs in 2 months when he has been compliant with the medication.  The goal here is not to see how few medications he can get by with taking, the goal is to live longer and avoid a heart attack.

## 2022-04-16 NOTE — Telephone Encounter (Signed)
I called patient and he assures me that he restarted his Crestor 40 mg daily per Dr. Aundra Dubin.  He will return for repeat labwork in November.

## 2022-04-24 ENCOUNTER — Other Ambulatory Visit (HOSPITAL_COMMUNITY): Payer: Self-pay

## 2022-04-24 ENCOUNTER — Other Ambulatory Visit (HOSPITAL_COMMUNITY): Payer: Self-pay | Admitting: Pharmacist

## 2022-04-24 MED ORDER — LOSARTAN POTASSIUM 50 MG PO TABS: 75.0000 mg | ORAL_TABLET | Freq: Every day | ORAL | 3 refills | Status: AC

## 2022-04-29 ENCOUNTER — Ambulatory Visit (HOSPITAL_COMMUNITY)
Admission: RE | Admit: 2022-04-29 | Discharge: 2022-04-29 | Disposition: A | Discharge status: Home / Self Care | Payer: Medicare HMO | Source: Ambulatory Visit | Attending: Cardiology | Admitting: Cardiology

## 2022-04-29 DIAGNOSIS — I5022 Chronic systolic (congestive) heart failure: Secondary | ICD-10-CM | POA: Diagnosis not present

## 2022-04-29 LAB — BASIC METABOLIC PANEL
Anion gap: 7 (ref 5–15)
BUN: 14 mg/dL (ref 8–23)
CO2: 26 mmol/L (ref 22–32)
Calcium: 9.5 mg/dL (ref 8.9–10.3)
Chloride: 105 mmol/L (ref 98–111)
Creatinine, Ser: 0.87 mg/dL (ref 0.61–1.24)
GFR, Estimated: >60 mL/min (ref >60)
Glucose, Bld: 106 mg/dL — ABNORMAL HIGH (ref 70–99)
Potassium: 4.3 mmol/L (ref 3.5–5.1)
Sodium: 138 mmol/L (ref 135–145)

## 2022-05-15 ENCOUNTER — Inpatient Hospital Stay (HOSPITAL_COMMUNITY): Admission: RE | Admit: 2022-05-15 | Payer: Medicare HMO | Source: Ambulatory Visit

## 2022-05-15 ENCOUNTER — Encounter: Payer: Self-pay | Admitting: Emergency Medicine

## 2022-05-15 ENCOUNTER — Ambulatory Visit: Payer: Medicare HMO | Admitting: Emergency Medicine

## 2022-05-15 DIAGNOSIS — K219 Gastro-esophageal reflux disease without esophagitis: Secondary | ICD-10-CM | POA: Diagnosis not present

## 2022-05-15 DIAGNOSIS — J45909 Unspecified asthma, uncomplicated: Secondary | ICD-10-CM

## 2022-05-15 DIAGNOSIS — J301 Allergic rhinitis due to pollen: Secondary | ICD-10-CM | POA: Diagnosis not present

## 2022-05-15 NOTE — Patient Instructions (Addendum)
Please continue Symbicort 2 puffs twice a day.  Rinse and gargle after using. Keep your Xopenex available to use 2 puffs when you needed for shortness of breath, chest tightness, wheezing. Continue your fluticasone nasal spray, 2 sprays each nostril once daily. Continue loratadine 10 mg once daily. Continue your pantoprazole 40 mg once daily.  Take this medication 1 hour around food. Get the flu shot this fall as planned You would probably benefit from getting the COVID-19 and RSV vaccines this fall Follow with Dr. Lamonte Sakai in 12 months or sooner if you have any problems.

## 2022-05-15 NOTE — Assessment & Plan Note (Signed)
Please continue Symbicort 2 puffs twice a day.  Rinse and gargle after using. Keep your Xopenex available to use 2 puffs when you needed for shortness of breath, chest tightness, wheezing. Get the flu shot this fall as planned You would probably benefit from getting the COVID-19 and RSV vaccines this fall Follow with Dr. Lamonte Sakai in 12 months or sooner if you have any problems.

## 2022-05-15 NOTE — Assessment & Plan Note (Signed)
Continue your pantoprazole 40 mg once daily.  Take this medication 1 hour around food.

## 2022-05-15 NOTE — Progress Notes (Signed)
   Subjective:    Patient ID: Jerry Montoya, male    DOB: 01-May-1956, 66 y.o.   MRN: [id number]  HPI  ROV 10/16/21 --66 year old man, never smoker whom I follow for longstanding asthma.  He has a history of CAD with ischemic cardiomyopathy, colon cancer, hypertension, GERD.  Currently managed on Symbicort, uses Xopenex as needed.  On pantoprazole. Today he reports that he has been doing well - has some ups and downs with his breathing. He has a lot of throat congestion, morning and night. Tolerating the symbicort. He is using xopenex about 5 time a week. Usually for SOB. Hears wheeze with strenuous activity, also morning and evening. He is able to walk 2 miles a day. He was treated with pred taper over a month ago, unclear trigger but had severe SOB . He averages prednisone about 2x a year.    ROV 05/15/22 --follow-up visit for 66 year old man with a history of longstanding asthma.  He is a never smoker.  He has been managed on Symbicort and Xopenex as needed.  Past medical history also significant for CAD with ischemic cardiomyopathy, colon cancer, hypertension, GERD (on pantoprazole), rhinitis.  He has some upper airway crossover, has had stridor before.  He has been doing well, has had some intermittent increased dyspnea, throat mucous, some wheeze. Responds to xopenex. He is using his symbicort pretty reliably, not always getting the evening dose. Occasional sputum production. Last prednisone was prior to our last visit. Remains on PPI, reflux controlled. He does have some choking / aspiration. Has had to have esophageal dilation before. He is on loratadine, flonase.     Review of Systems As per HPI      Objective:   Physical Exam Vitals:   05/15/22 1438  BP: 122/66  Pulse: 97  Temp: 98.2 F (36.8 C)  TempSrc: Oral  SpO2: 97%  Weight: 174 lb 9.6 oz (79.2 kg)  Height: '5\' 10"'$  (1.778 m)   Gen: Pleasant, well-nourished, in no distress,  normal affect  ENT: No lesions,  mouth clear,   oropharynx clear, no postnasal drip  Neck: No JVD, loud stridor on exp  Lungs: No use of accessory muscles, no crackles or wheezing on normal respiration, no wheeze on forced expiration, referred UA noise  Cardiovascular: RRR, heart sounds normal, no murmur or gallops, no peripheral edema  Musculoskeletal: No deformities, no cyanosis or clubbing  Neuro: alert, awake, non focal  Skin: Warm, no lesions or rash      Assessment & Plan:  Asthma Please continue Symbicort 2 puffs twice a day.  Rinse and gargle after using. Keep your Xopenex available to use 2 puffs when you needed for shortness of breath, chest tightness, wheezing. Get the flu shot this fall as planned You would probably benefit from getting the COVID-19 and RSV vaccines this fall Follow with Dr. Lamonte Sakai in 12 months or sooner if you have any problems.   Allergic rhinitis Continue your fluticasone nasal spray, 2 sprays each nostril once daily. Continue loratadine 10 mg once daily.  GERD (gastroesophageal reflux disease) Continue your pantoprazole 40 mg once daily.  Take this medication 1 hour around food.  Baltazar Apo, MD, PhD 05/15/2022, 3:09 PM [facility] [phone] or if no answer [phone]

## 2022-05-15 NOTE — Assessment & Plan Note (Signed)
Continue your fluticasone nasal spray, 2 sprays each nostril once daily. Continue loratadine 10 mg once daily.

## 2022-05-22 DIAGNOSIS — Z23 Encounter for immunization: Secondary | ICD-10-CM | POA: Diagnosis not present

## 2022-05-22 DIAGNOSIS — E291 Testicular hypofunction: Secondary | ICD-10-CM | POA: Diagnosis not present

## 2022-05-22 DIAGNOSIS — I10 Essential (primary) hypertension: Secondary | ICD-10-CM | POA: Diagnosis not present

## 2022-05-22 DIAGNOSIS — I2510 Atherosclerotic heart disease of native coronary artery without angina pectoris: Secondary | ICD-10-CM | POA: Diagnosis not present

## 2022-05-22 DIAGNOSIS — R7303 Prediabetes: Secondary | ICD-10-CM | POA: Diagnosis not present

## 2022-05-22 DIAGNOSIS — C189 Malignant neoplasm of colon, unspecified: Secondary | ICD-10-CM | POA: Diagnosis not present

## 2022-05-22 DIAGNOSIS — J4530 Mild persistent asthma, uncomplicated: Secondary | ICD-10-CM | POA: Diagnosis not present

## 2022-05-22 DIAGNOSIS — M810 Age-related osteoporosis without current pathological fracture: Secondary | ICD-10-CM | POA: Diagnosis not present

## 2022-05-22 DIAGNOSIS — I255 Ischemic cardiomyopathy: Secondary | ICD-10-CM | POA: Diagnosis not present

## 2022-05-22 DIAGNOSIS — E785 Hyperlipidemia, unspecified: Secondary | ICD-10-CM | POA: Diagnosis not present

## 2022-05-22 DIAGNOSIS — I700 Atherosclerosis of aorta: Secondary | ICD-10-CM | POA: Diagnosis not present

## 2022-06-03 ENCOUNTER — Inpatient Hospital Stay (HOSPITAL_COMMUNITY): Admission: RE | Admit: 2022-06-03 | Payer: Medicare HMO | Source: Ambulatory Visit

## 2022-06-10 DIAGNOSIS — M810 Age-related osteoporosis without current pathological fracture: Secondary | ICD-10-CM | POA: Diagnosis not present

## 2022-06-10 DIAGNOSIS — E291 Testicular hypofunction: Secondary | ICD-10-CM | POA: Diagnosis not present

## 2022-06-10 DIAGNOSIS — K222 Esophageal obstruction: Secondary | ICD-10-CM | POA: Diagnosis not present

## 2022-06-17 ENCOUNTER — Ambulatory Visit (HOSPITAL_COMMUNITY)
Admission: RE | Admit: 2022-06-17 | Discharge: 2022-06-17 | Disposition: A | Discharge status: Home / Self Care | Payer: Medicare HMO | Source: Ambulatory Visit | Attending: Cardiology | Admitting: Cardiology

## 2022-06-17 DIAGNOSIS — E782 Mixed hyperlipidemia: Secondary | ICD-10-CM

## 2022-06-20 ENCOUNTER — Other Ambulatory Visit: Payer: Self-pay | Admitting: Emergency Medicine

## 2022-06-24 NOTE — Progress Notes (Incomplete)
***In Progress***    Advanced Heart Failure Clinic Note   PCP: Dr. Felipa Eth Cardiology: Dr. Aundra Dubin  HPI:  66 y.o. with history of asthma and colon cancer was admitted in 04/2014 with anterior STEMI. He had fresh total occlusion of the mid LAD and chronic total occlusion of the mid LCx with collaterals. He had Xience DES to LAD. Echo that admission showed EF 45-50%. Repeat echo in 1/16 showed EF up to 55-60%. In 11/2015, he had a Cardiolite that was low risk with a fixed apical inferior defect and no ischemia. In 04/2018, he had LHC due to angina showing CTO proximal LCx with collaterals, 60% pLAD, 90% D1 stenosis. He had DES to D1.    Patient had right inguinal hernia repair in 02/2019.    Echo 06/2020 showed EF 55-60% with normal wall motion.  With ongoing exertional chest pain and dyspnea, he had LHC in 07/2020.  This showed stable chronically occluded LCX with nonobstructive CAD elsewhere.    He saw pulmonology and was started on Symbicort.    Seen for f/u 05/02/2021 for an acute visit. Noting increased fatigue and dyspnea. Used rescue inhaler more frequently. ReDS 40%. Volume appeared up. Given 20 mg furosemide X 3 days followed by PRN.    CPX 05/07/2021: Low normal functional capacity. Ventilatory limitation at peak exercise with moderate obstruction on resting spirometry. No obvious HF limitation.   Returns for followup of CAD on 04/15/2022.  He had been exercising more. He was still wheezing at times due to asthma. Generally, no exertional dyspnea unless he was having an asthma flare. No chest pain. No lightheadedness or palpitations. He had been taking care of his grandchildren during the day. Compliance with medications was questionable. He said his goal is to "get off all the medications." He had stopped clopidogrel. He was taking losartan 50 mg daily. Unclear if he was actually taking rosuvastatin. SBP 130s-140s at home.   Today he returns to HF clinic for pharmacist medication  titration. At last visit with MD, losartan was increased to 75 mg. He was also instructed to restart clopidogrel 75 mg daily and rosuvastatin 40 mg daily.    Overall feeling ***. Dizziness, lightheadedness, fatigue:  Chest pain or palpitations:  How is your breathing?: *** SOB: Checking BP daily?  Restart clopidogrel and rosuvastatin?   Weight at home stable at *** pounds. He does not require a daily diuretic.  LEE PND/Orthopnea  Appetite *** Low-salt diet:   Physical Exam Cost/affordability of meds  HF Medications: Losartan 75 mg daily Lasix 20 mg PRN  Understanding of regimen: {excellent/good/fair/poor:19665} Understanding of indications: {excellent/good/fair/poor:19665} Potential of compliance: {excellent/good/fair/poor:19665} Patient understands to avoid NSAIDs. Patient understands to avoid decongestants.    Pertinent Lab Values: (04/29/2022) Serum creatinine 0.87, BUN 14, Potassium 4.3, Sodium 138  Vital Signs: Weight: *** (last clinic weight: 172 lbs) Blood pressure: ***  Heart rate: ***   Assessment/Plan: 1. CAD: s/p anterior STEMI 04/2014 with DES to mLAD.  Also had CTO mid LCx with collaterals that was not treated percutaneously.  In 04/2018, he had crescendo angina. Cath with 90% D1 stenosis, had DES to D1. Echo 1/22 EF 55-60%. LHC 1/22 showed known occluded LCx with collaterals from the RCA; stable nonobstructive disease in the LAD & RCA. No recent chest pain.  - Continue clopidogrel 75 mg daily  - He is off bisoprolol with poor asthma control.  - Continue rosuvastatin 40 mg daily. Lipids/LFTs today pending? ***.   2. Ischemic cardiomyopathy with recovery in  LV function: EF 45-50% on initial echo, improved to 55-60% in 07/2014. Echo 06/2018 with EF 55-60%. Echo 07/2020 showed EF 55-60% with normal RV. CPX 10/22 showed low normal functional capacity. Ventilatory limitation at peak exercise with moderate obstruction on resting spirometry. No obvious HF limitation  noted. Suspect his dyspnea is primarily due to asthma.  - Increase/Continue losartan to 75 mg daily today.  BMET stable 04/30/2023..  - Off bisoprolol with previous wheezing/asthma.  3. Hyperlipidemia: LDL markedly high on 04/15/2022, he is likely not taking his Crestor (adherence improved? ***).  Goal LDL < 55, he is at 166.  - Continue rosuvastatin 40 mg daily. Lipids/LFTs today pending ***.   4. HTN: BP elevated today ***.  He is taking losartan 50 mg daily where he used to take 75 mg daily.  I suspect he has cut this med back on his own also. Goal SBP < 130. - Increase/Continue *** losartan to 75 mg daily.    5. Asthma: Follow-up with Dr. Lamonte Sakai.   6. Medication noncompliance: *** improved?  Mr Sudano stated goal is to get off all his medications. He was told that this is really not a reasonable goal for a person with extensive CAD if that person wishes to avoid future MI/cardiac death. He is obviously not taking his statin and has inexplicably stopped his antiplatelet agent as well.  We have been over all this in the past and hopefully at some point this will stick before he has another MI.   Follow up with Dr. Aundra Dubin in March-April 2024   Audry Riles, PharmD, BCPS, [facility], CPP Heart Failure Clinic Pharmacist [phone]

## 2022-07-01 ENCOUNTER — Inpatient Hospital Stay (HOSPITAL_COMMUNITY): Admission: RE | Admit: 2022-07-01 | Payer: Medicare HMO | Source: Ambulatory Visit

## 2022-07-11 ENCOUNTER — Other Ambulatory Visit (HOSPITAL_COMMUNITY): Payer: Self-pay | Admitting: *Deleted

## 2022-07-15 ENCOUNTER — Ambulatory Visit (HOSPITAL_COMMUNITY)
Admission: RE | Admit: 2022-07-15 | Discharge: 2022-07-15 | Disposition: A | Discharge status: Home / Self Care | Payer: Medicare HMO | Source: Ambulatory Visit | Attending: Endocrinology | Admitting: Endocrinology

## 2022-07-15 DIAGNOSIS — M810 Age-related osteoporosis without current pathological fracture: Secondary | ICD-10-CM | POA: Insufficient documentation

## 2022-07-15 MED ORDER — ZOLEDRONIC ACID 5 MG/100ML IV SOLN
5.0000 mg | Freq: Once | INTRAVENOUS | Status: AC
Start: 2022-07-15 — End: 2022-07-15

## 2022-07-15 MED ORDER — ZOLEDRONIC ACID 5 MG/100ML IV SOLN
INTRAVENOUS | Status: AC
  Administered 2022-07-15: 5 mg via INTRAVENOUS
  Filled 2022-07-15: qty 100

## 2022-08-19 DIAGNOSIS — I700 Atherosclerosis of aorta: Secondary | ICD-10-CM | POA: Diagnosis not present

## 2022-08-19 DIAGNOSIS — Z125 Encounter for screening for malignant neoplasm of prostate: Secondary | ICD-10-CM | POA: Diagnosis not present

## 2022-08-19 DIAGNOSIS — E785 Hyperlipidemia, unspecified: Secondary | ICD-10-CM | POA: Diagnosis not present

## 2022-08-19 DIAGNOSIS — I10 Essential (primary) hypertension: Secondary | ICD-10-CM | POA: Diagnosis not present

## 2022-08-19 DIAGNOSIS — R7989 Other specified abnormal findings of blood chemistry: Secondary | ICD-10-CM | POA: Diagnosis not present

## 2022-08-19 DIAGNOSIS — R7303 Prediabetes: Secondary | ICD-10-CM | POA: Diagnosis not present

## 2022-08-19 DIAGNOSIS — K219 Gastro-esophageal reflux disease without esophagitis: Secondary | ICD-10-CM | POA: Diagnosis not present

## 2022-08-19 DIAGNOSIS — M810 Age-related osteoporosis without current pathological fracture: Secondary | ICD-10-CM | POA: Diagnosis not present

## 2022-08-28 DIAGNOSIS — I255 Ischemic cardiomyopathy: Secondary | ICD-10-CM | POA: Diagnosis not present

## 2022-08-28 DIAGNOSIS — R82998 Other abnormal findings in urine: Secondary | ICD-10-CM | POA: Diagnosis not present

## 2022-08-28 DIAGNOSIS — I700 Atherosclerosis of aorta: Secondary | ICD-10-CM | POA: Diagnosis not present

## 2022-08-28 DIAGNOSIS — I10 Essential (primary) hypertension: Secondary | ICD-10-CM | POA: Diagnosis not present

## 2022-08-28 DIAGNOSIS — I2510 Atherosclerotic heart disease of native coronary artery without angina pectoris: Secondary | ICD-10-CM | POA: Diagnosis not present

## 2022-08-28 DIAGNOSIS — E785 Hyperlipidemia, unspecified: Secondary | ICD-10-CM | POA: Diagnosis not present

## 2022-08-28 DIAGNOSIS — Z0000 Encounter for general adult medical examination without abnormal findings: Secondary | ICD-10-CM | POA: Diagnosis not present

## 2022-08-28 DIAGNOSIS — E291 Testicular hypofunction: Secondary | ICD-10-CM | POA: Diagnosis not present

## 2022-08-28 DIAGNOSIS — M810 Age-related osteoporosis without current pathological fracture: Secondary | ICD-10-CM | POA: Diagnosis not present

## 2022-08-28 DIAGNOSIS — J4530 Mild persistent asthma, uncomplicated: Secondary | ICD-10-CM | POA: Diagnosis not present

## 2022-08-28 DIAGNOSIS — Z85038 Personal history of other malignant neoplasm of large intestine: Secondary | ICD-10-CM | POA: Diagnosis not present

## 2022-08-28 DIAGNOSIS — R7303 Prediabetes: Secondary | ICD-10-CM | POA: Diagnosis not present

## 2022-09-08 ENCOUNTER — Other Ambulatory Visit (HOSPITAL_COMMUNITY): Payer: Self-pay

## 2022-09-08 ENCOUNTER — Telehealth: Payer: Self-pay | Admitting: Emergency Medicine

## 2022-09-08 ENCOUNTER — Other Ambulatory Visit: Payer: Self-pay

## 2022-09-08 MED ORDER — BUDESONIDE-FORMOTEROL FUMARATE 160-4.5 MCG/ACT IN AERO: 2.0000 | INHALATION_SPRAY | Freq: Two times a day (BID) | RESPIRATORY_TRACT | 6 refills | Status: DC

## 2022-09-08 MED ORDER — LEVALBUTEROL TARTRATE 45 MCG/ACT IN AERO: 1.0000 | INHALATION_SPRAY | Freq: Four times a day (QID) | RESPIRATORY_TRACT | 12 refills | Status: DC | PRN

## 2022-09-08 NOTE — Telephone Encounter (Signed)
Spoke with patient advised refill for Xopenx has been sent to pharmacy. Also advised I have sent a message to prior auth team to run on his insurance to see which inhaler is preferred since Symbicort is no longer covered. Pt states he has plenty left until he hears back from [place]. Will close encounter for now.

## 2022-09-08 NOTE — Telephone Encounter (Signed)
Called and spoke with pt letting him know the info per pharmacy team and he verbalized understanding. Generic symbicort has been sent to preferred pharmacy for pt. Nothing further needed.

## 2022-09-08 NOTE — Telephone Encounter (Signed)
Per test claim generic Symbicort is covered under the patients current plan, prescription seems to be for DAW 1 Brand Symbicort at this time.

## 2022-09-08 NOTE — Telephone Encounter (Signed)
Yes please. 160/4.5

## 2022-09-08 NOTE — Telephone Encounter (Signed)
Dr. Lamonte Sakai are you okay refilling Xopenex since you havent before?  Also prior auth team can you run a ticket on patients insurance to see what's preferred since they no longer will cover Symbicort

## 2022-09-08 NOTE — Addendum Note (Signed)
Addended by: Lorretta Harp on: 09/08/2022 02:18 PM   Modules accepted: Orders

## 2022-09-08 NOTE — Telephone Encounter (Signed)
I'm ok w the xopenex, will await the test run to see about substitute for symbicort

## 2022-09-08 NOTE — Telephone Encounter (Signed)
Insurance prefers generic Symbicort. If your fine with this I will get it sent in

## 2022-09-08 NOTE — Telephone Encounter (Signed)
Pt. Calling about rescue inhaler is EXP. XOPENEX HFA)  wants new refill sent to [facility] on [address] and also calling about  SYMBICORT  is not going to get covered by his [place] anymore so what else could be prescribed instead

## 2022-09-17 ENCOUNTER — Ambulatory Visit: Payer: Medicare HMO | Admitting: Primary Care

## 2022-09-17 ENCOUNTER — Encounter: Payer: Self-pay | Admitting: Primary Care

## 2022-09-17 ENCOUNTER — Ambulatory Visit (INDEPENDENT_AMBULATORY_CARE_PROVIDER_SITE_OTHER): Payer: Medicare HMO

## 2022-09-17 VITALS — BP 138/74 | HR 94 | Temp 98.4°F | Ht 70.0 in | Wt 170.4 lb

## 2022-09-17 DIAGNOSIS — R051 Acute cough: Secondary | ICD-10-CM

## 2022-09-17 DIAGNOSIS — J4541 Moderate persistent asthma with (acute) exacerbation: Secondary | ICD-10-CM

## 2022-09-17 DIAGNOSIS — J45901 Unspecified asthma with (acute) exacerbation: Secondary | ICD-10-CM | POA: Insufficient documentation

## 2022-09-17 DIAGNOSIS — J45909 Unspecified asthma, uncomplicated: Secondary | ICD-10-CM | POA: Diagnosis not present

## 2022-09-17 MED ORDER — LEVALBUTEROL HCL 0.63 MG/3ML IN NEBU: 0.6300 mg | INHALATION_SOLUTION | Freq: Four times a day (QID) | RESPIRATORY_TRACT | 3 refills | Status: DC | PRN

## 2022-09-17 MED ORDER — METHYLPREDNISOLONE ACETATE 80 MG/ML IJ SUSP
80.0000 mg | Freq: Once | INTRAMUSCULAR | Status: AC
  Administered 2022-09-17: 80 mg via INTRAMUSCULAR

## 2022-09-17 MED ORDER — PREDNISONE 10 MG PO TABS: ORAL_TABLET | ORAL | 0 refills | Status: AC

## 2022-09-17 NOTE — Patient Instructions (Addendum)
Recommendations: Use nebulizer every 6 hours next couple of days until you are better Take mucinex '600mg'$  twice a day for x 1 weeks or until better (helps thin mucus and get up) Start prednisone taper tomorrow  CXR today to ensure you do not have pneumonia  Orders: CXR and labs today  Office treatment: Depo medrol '80mg'$  IM x1  Rx: Prednisone taper Refill levalbuterol nebulizer   Follow-up: 1 month with Dr. Lamonte Sakai to discuss biologics / follow up on asthma flare

## 2022-09-17 NOTE — Progress Notes (Signed)
$'@Patient'O$  ID: Jerry Montoya, male    DOB: 03/25/1956, 67 y.o.   MRN: [id number]  Chief Complaint  Patient presents with   Follow-up    Sinus infection 09/03/22.  PCP rx Zpak.  09/10/22 woke up with severe coughing and wheezing.  Patient feels he may have aspirated on reflux.  Would like refill on Xopenex 0.63 neb solution for nebulizer.    Referring provider: Lajean Manes, MD  HPI: 67 year old male, never smoked.  Past medical history significant for coronary artery disease, cardiomyopathy, ST elevated MI, asthma, allergic rhinitis, GERD, sigmoid colon cancer, hyperlipidemia.  09/17/2022- interim hx  Patient presents today for acute visit d/t cough. He is followed by Dr. Lamonte Sakai for asthma. He was treated recently for sinus infection by PCP with azithromycin. Last Wednesday night he woke with coughing fit and was not able to breath. He was coughing for 3 hours straight and thinks he may have aspirated. He is coughing up some purulent sputum which is darker than normal for him. He has chronic wheezing symptoms. He is taking Symbicort 128mg two puffs twice daily. Routine maintenance inhaler changed to generic Symbicort in February 2024. Needs xopnex nebulizer refilled.    Allergies  Allergen Reactions   Albuterol Other (See Comments)    Caused heart attack   Azithromycin Other (See Comments)    Thrush    Immunization History  Administered Date(s) Administered   Influenza Split 06/20/2013, 05/01/2014   Influenza, High Dose Seasonal PF 04/28/2019, 05/29/2020   Influenza,inj,Quad PF,6+ Mos 05/02/2014, 05/06/2017, 05/17/2018   PFIZER(Purple Top)SARS-COV-2 Vaccination 08/11/2019, 09/01/2019   Pneumococcal Polysaccharide-23 06/20/2013, 05/02/2014, 04/28/2019   Td 05/02/2008   Tdap 05/17/2018   Zoster, Live 05/16/2019, 07/19/2019    Past Medical History:  Diagnosis Date   Asthma    Cancer ([facility] 2015   colon cancer   CHF (congestive heart failure) (HCC)    Complication of  anesthesia    slow to awaken   Esophageal reflux    Hypercholesterolemia    Hypertension    [place], 06[place]    Tobacco History: Social History   Tobacco Use  Smoking Status Never  Smokeless Tobacco Never   Counseling given: Not Answered   Outpatient Medications Prior to Visit  Medication Sig Dispense Refill   clopidogrel (PLAVIX) 75 MG tablet Take 1 tablet (75 mg total) by mouth daily. 90 tablet 3   EPIPEN 2-PAK 0.3 MG/0.3ML SOAJ injection Inject 0.3 mg into the skin once as needed for anaphylaxis.   0   fluticasone (FLONASE) 50 MCG/ACT nasal spray Use 1 spray(s) in each nostril once daily 16 g 0   furosemide (LASIX) 20 MG tablet Take 1 tablet (20 mg total) by mouth daily as needed for fluid or edema. 30 tablet 11   levalbuterol (XOPENEX HFA) 45 MCG/ACT inhaler Inhale 1-2 puffs into the lungs every 6 (six) hours as needed for wheezing. 1 each 12   loratadine (CLARITIN) 10 MG tablet Take 1 tablet (10 mg total) by mouth daily. 30 tablet 11   losartan (COZAAR) 50 MG tablet Take 1.5 tablets (75 mg total) by mouth daily. 135 tablet 3   nitroGLYCERIN (NITROSTAT) 0.4 MG SL tablet DISSOLVE ONE TABLET UNDER THE TONGUE EVERY 5 MINUTES AS NEEDED FOR CHEST PAIN.  DO NOT EXCEED A TOTAL OF 3 DOSES IN 15 MINUTES 25 tablet 0   pantoprazole (PROTONIX) 40 MG tablet TAKE 1 TABLET BY MOUTH  DAILY 90 tablet 3   rosuvastatin (CRESTOR) 40 MG tablet  Take 1 tablet (40 mg total) by mouth daily. 30 tablet 6   budesonide-formoterol (SYMBICORT) 160-4.5 MCG/ACT inhaler Inhale 2 puffs into the lungs 2 (two) times daily. (Patient not taking: Reported on 09/17/2022) 10.2 g 6   No facility-administered medications prior to visit.   Review of Systems  Review of Systems  Constitutional: Negative.   HENT: Negative.    Respiratory:  Positive for cough, shortness of breath and wheezing.    Physical Exam  BP 138/74 (BP Location: Right Arm, Patient Position: Sitting, Cuff Size: Large)    Pulse 94   Temp 98.4 F (36.9 C) (Oral)   Ht '5\' 10"'$  (1.778 m)   Wt 170 lb 6.4 oz (77.3 kg)   SpO2 96%   BMI 24.45 kg/m  Physical Exam Constitutional:      Appearance: Normal appearance.  HENT:     Head: Normocephalic and atraumatic.     Mouth/Throat:     Mouth: Mucous membranes are moist.     Pharynx: Oropharynx is clear.  Cardiovascular:     Rate and Rhythm: Normal rate and regular rhythm.  Pulmonary:     Effort: Pulmonary effort is normal.     Breath sounds: Wheezing and rhonchi present.  Musculoskeletal:        General: Normal range of motion.  Skin:    General: Skin is warm and dry.  Neurological:     General: No focal deficit present.     Mental Status: He is alert and oriented to person, place, and time. Mental status is at baseline.  Psychiatric:        Mood and Affect: Mood normal.        Behavior: Behavior normal.        Thought Content: Thought content normal.        Judgment: Judgment normal.      Lab Results:  CBC    Component Value Date/Time   WBC 6.7 05/02/2021 1635   RBC 5.05 05/02/2021 1635   HGB 15.2 05/02/2021 1635   HCT 45.3 05/02/2021 1635   PLT 191 05/02/2021 1635   MCV 89.7 05/02/2021 1635   MCH 30.1 05/02/2021 1635   MCHC 33.6 05/02/2021 1635   RDW 12.7 05/02/2021 1635   LYMPHSABS 2.0 03/20/2014 1430   MONOABS 0.5 03/20/2014 1430   EOSABS 0.4 03/20/2014 1430   BASOSABS 0.0 03/20/2014 1430    BMET    Component Value Date/Time   NA 138 04/29/2022 0844   K 4.3 04/29/2022 0844   CL 105 04/29/2022 0844   CO2 26 04/29/2022 0844   GLUCOSE 106 (H) 04/29/2022 0844   BUN 14 04/29/2022 0844   CREATININE 0.87 04/29/2022 0844   CREATININE 0.90 11/26/2015 1357   CALCIUM 9.5 04/29/2022 0844   GFRNONAA >60 04/29/2022 0844   GFRAA >60 03/08/2019 1010    BNP No results found for: "BNP"  ProBNP No results found for: "PROBNP"  Imaging: DG Chest 2 View  Result Date: 09/17/2022 CLINICAL DATA:  Asthmatic bronchitis exacerbation.  Aspirated 2 weeks ago. EXAM: CHEST - 2 VIEW COMPARISON:  Chest radiograph 07/07/2014. FINDINGS: Clear lungs. Normal heart size and mediastinal contours. No pleural effusion or pneumothorax. Visualized bones and upper abdomen are unremarkable. IMPRESSION: No evidence of acute cardiopulmonary disease. Electronically Signed   By: Emmit Alexanders M.D.   On: 09/17/2022 16:01     Assessment & Plan:   Acute asthma exacerbation - Asthma acutely exacerbated secondary to sinobronchitis. He has productive cough along with persistent wheezing symptoms.  Patient completed azithromycin x 5 days. Chest x-ray today without acute process.  Patient was given Depo-Medrol 80 mg IM x 1 in office today. Advised patient take mucinex twice daily and start prednisone taper. Continue Symbicort 160 mcg 2 puffs twice daily along with Xopenex nebulizer every 4-6 hours for breakthrough shortness of breath/wheezing symptoms. Checking CBC with diff and IgE. He typically requires oral steroids twice a year and asthma symptoms appear poorly controled on high dose ICS/LABA, he may be good candidate to start biologics. FU in 1 months with Dr. Lamonte Sakai.   Martyn Ehrich, NP 09/17/2022

## 2022-09-17 NOTE — Assessment & Plan Note (Signed)
-   Asthma acutely exacerbated secondary to sinobronchitis. He has productive cough along with persistent wheezing symptoms. Patient completed azithromycin x 5 days. Chest x-ray today without acute process.  Patient was given Depo-Medrol 80 mg IM x 1 in office today. Advised patient take mucinex twice daily and start prednisone taper. Continue Symbicort 160 mcg 2 puffs twice daily along with Xopenex nebulizer every 4-6 hours for breakthrough shortness of breath/wheezing symptoms. Checking CBC with diff and IgE. He typically requires oral steroids twice a year and asthma symptoms appear poorly controled on high dose ICS/LABA, he may be good candidate to start biologics. FU in 1 months with Dr. Lamonte Sakai.

## 2022-09-18 ENCOUNTER — Other Ambulatory Visit: Payer: Self-pay | Admitting: Emergency Medicine

## 2022-09-18 LAB — CBC WITH DIFFERENTIAL/PLATELET
Basophils Absolute: 0.1 10*3/uL (ref 0.0–0.1)
Basophils Relative: 0.7 % (ref 0.0–3.0)
Eosinophils Absolute: 0.3 10*3/uL (ref 0.0–0.7)
Eosinophils Relative: 4.7 % (ref 0.0–5.0)
HCT: 42.1 % (ref 39.0–52.0)
Hemoglobin: 14.4 g/dL (ref 13.0–17.0)
Lymphocytes Relative: 22.6 % (ref 12.0–46.0)
Lymphs Abs: 1.5 10*3/uL (ref 0.7–4.0)
MCHC: 34.3 g/dL (ref 30.0–36.0)
MCV: 89.2 fl (ref 78.0–100.0)
Monocytes Absolute: 0.6 10*3/uL (ref 0.1–1.0)
Monocytes Relative: 9.3 % (ref 3.0–12.0)
Neutro Abs: 4.3 10*3/uL (ref 1.4–7.7)
Neutrophils Relative %: 62.7 % (ref 43.0–77.0)
Platelets: 232 10*3/uL (ref 150.0–400.0)
RBC: 4.72 Mil/uL (ref 4.22–5.81)
RDW: 13.2 % (ref 11.5–15.5)
WBC: 6.8 10*3/uL (ref 4.0–10.5)

## 2022-09-18 LAB — IGE: IgE (Immunoglobulin E), Serum: 333 kU/L — ABNORMAL HIGH (ref <=114)

## 2022-09-19 NOTE — Progress Notes (Signed)
Allergy testing was elevated, he would be a good candidate to start biologist. He has an apt in April with Dr. Lamonte Sakai and recommend they discuss at that visit

## 2022-10-23 ENCOUNTER — Encounter: Payer: Self-pay | Admitting: Emergency Medicine

## 2022-10-23 ENCOUNTER — Telehealth: Payer: Self-pay

## 2022-10-23 ENCOUNTER — Ambulatory Visit: Payer: Medicare HMO | Admitting: Emergency Medicine

## 2022-10-23 ENCOUNTER — Telehealth: Payer: Self-pay | Admitting: Pharmacist

## 2022-10-23 VITALS — BP 136/78 | HR 89 | Temp 97.9°F | Ht 70.0 in | Wt 170.6 lb

## 2022-10-23 DIAGNOSIS — K219 Gastro-esophageal reflux disease without esophagitis: Secondary | ICD-10-CM | POA: Diagnosis not present

## 2022-10-23 DIAGNOSIS — J45909 Unspecified asthma, uncomplicated: Secondary | ICD-10-CM

## 2022-10-23 DIAGNOSIS — J301 Allergic rhinitis due to pollen: Secondary | ICD-10-CM

## 2022-10-23 NOTE — Telephone Encounter (Signed)
Thanks! Will start [place]  Knox Saliva, PharmD, MPH, BCPS, CPP Clinical Pharmacist (Rheumatology and Pulmonology)

## 2022-10-23 NOTE — Telephone Encounter (Signed)
Submitted a Prior Authorization request to [facility] for XOLAIR via CoverMyMeds. Will update once we receive a response.  Key: Tresa Moore, PharmD, MPH, BCPS, CPP Clinical Pharmacist (Rheumatology and Pulmonology)

## 2022-10-23 NOTE — Assessment & Plan Note (Signed)
Continue loratadine and your fluticasone nasal spray as you have been using them

## 2022-10-23 NOTE — Assessment & Plan Note (Addendum)
He is averaging 2-3 flares a year.  There is some upper airway crossover but suspect he would benefit from Xolair both to manage his asthma and also his chronic rhinitis which contributes to upper and lower airways disease.  His IgE was 333 and he would qualify.  I will make the Xolair referral.  Please continue your Symbicort 2 puffs twice daily.  Rinse gargle after using. Keep your Xopenex available to use either 2 puffs or 1 nebulizer treatment up to every 4 hours if needed for shortness of breath, chest tightness, wheezing. We reviewed your lab work today.  Will make referral for you to start Xolair injection to help with allergy and asthma. Follow with Dr Lamonte Sakai in 3 months or sooner if you have any problems.

## 2022-10-23 NOTE — Patient Instructions (Addendum)
Please continue your Symbicort 2 puffs twice daily.  Rinse gargle after using. Keep your Xopenex available to use either 2 puffs or 1 nebulizer treatment up to every 4 hours if needed for shortness of breath, chest tightness, wheezing. We reviewed your lab work today.  Will make referral for you to start Xolair injection to help with allergy and asthma. Continue loratadine and your fluticasone nasal spray as you have been using them Continue Protonix as you have been using it Follow with gastroenterology as planned Follow with Dr Lamonte Sakai in 3 months or sooner if you have any problems.

## 2022-10-23 NOTE — Telephone Encounter (Signed)
Dr. Lamonte Sakai would like to start pt on Xolair. Pt paperwork was given to pt who will return when completed.

## 2022-10-23 NOTE — Telephone Encounter (Signed)
Thank you :)

## 2022-10-23 NOTE — Telephone Encounter (Signed)
Please start Xolair BIV.  Dose based on baseline pre-treatment IgE 333 and weight of 77.4 kg --> 300mg  SQ every 2 weeks  Per note received, patient took home his portion of [employer] application  Knox Saliva, PharmD, MPH, BCPS, CPP Clinical Pharmacist (Rheumatology and Pulmonology)

## 2022-10-23 NOTE — Progress Notes (Signed)
   Subjective:    Patient ID: Jerry Montoya, male    DOB: 01/25/1956, 67 y.o.   MRN: [id number]  HPI  ROV 10/23/22 --Jerry Montoya is 66 with a history of longstanding asthma, never smoker.  Also with a history of CAD with ischemic cardiomyopathy, colon cancer, hypertension, GERD and chronic rhinitis.  Occasional choking/aspiration/dysphagia symptoms. He is on Symbicort, uses several times a week. Usually for wheeze and tightness. Remains on flonase and loratadine.  He was acutely ill in February, was treated for sinusitis and acute exacerbation of his asthma.  In retrospect he has flares about twice annually that require corticosteroids.  IgE 09/17/2022 333, relative eosinophils 4.7%, absolute 0.3   Review of Systems As per HPI      Objective:   Physical Exam Vitals:   10/23/22 0853  BP: 136/78  Pulse: 89  Temp: 97.9 F (36.6 C)  TempSrc: Oral  SpO2: 95%  Weight: 170 lb 9.6 oz (77.4 kg)  Height: 5\' 10"  (1.778 m)   Gen: Pleasant, well-nourished, in no distress,  normal affect  ENT: No lesions,  mouth clear,  oropharynx clear, no postnasal drip  Neck: No JVD, loud stridor on exp  Lungs: No use of accessory muscles, no crackles or wheezing on normal respiration, no wheeze on forced expiration, referred UA noise  Cardiovascular: RRR, heart sounds normal, no murmur or gallops, no peripheral edema  Musculoskeletal: No deformities, no cyanosis or clubbing  Neuro: alert, awake, non focal  Skin: Warm, no lesions or rash      Assessment & Plan:  Asthma He is averaging 2-3 flares a year.  There is some upper airway crossover but suspect he would benefit from Xolair both to manage his asthma and also his chronic rhinitis which contributes to upper and lower airways disease.  His IgE was 333 and he would qualify.  I will make the Xolair referral.  Please continue your Symbicort 2 puffs twice daily.  Rinse gargle after using. Keep your Xopenex available to use either 2 puffs or 1  nebulizer treatment up to every 4 hours if needed for shortness of breath, chest tightness, wheezing. We reviewed your lab work today.  Will make referral for you to start Xolair injection to help with allergy and asthma. Follow with Dr Lamonte Sakai in 3 months or sooner if you have any problems.  Allergic rhinitis Continue loratadine and your fluticasone nasal spray as you have been using them  GERD (gastroesophageal reflux disease) History of dysphagia, planning to see GI for esophageal stretching.  He also has GERD.  Continue Protonix as you have been using it Follow with gastroenterology as planned  Baltazar Apo, MD, PhD 10/23/2022, 3:06 PM [facility] [phone] or if no answer [phone]

## 2022-10-23 NOTE — Assessment & Plan Note (Signed)
History of dysphagia, planning to see GI for esophageal stretching.  He also has GERD.  Continue Protonix as you have been using it Follow with gastroenterology as planned

## 2022-10-24 ENCOUNTER — Other Ambulatory Visit (HOSPITAL_COMMUNITY): Payer: Self-pay

## 2022-10-24 NOTE — Telephone Encounter (Signed)
Received notification from [facility] regarding a prior authorization for XOLAIR. Authorization has been APPROVED from 07/21/22 to 07/21/23. Approval letter sent to scan center.  Per test claim, copay for 28 days supply is $1740.11  Patient can fill through [facility]: [phone]   Will await return of paperwork from patient  Chesley Mires, PharmD, MPH, BCPS, CPP Clinical Pharmacist (Rheumatology and Pulmonology)

## 2022-10-30 ENCOUNTER — Encounter (HOSPITAL_COMMUNITY): Payer: Self-pay

## 2022-10-30 ENCOUNTER — Telehealth (HOSPITAL_COMMUNITY): Payer: Self-pay

## 2022-10-30 NOTE — Telephone Encounter (Signed)
Fax sent to [facility] for clearance for endoscopy.Fax confirmation received from [phone]

## 2022-11-03 NOTE — Telephone Encounter (Signed)
ATC patient to discuss Xolair access and request Xolair PAP forms be returned to clinic. Phone went straight to busy tone x 3 and unable to leave VM. MyChart message sent to pt  Jerry Montoya, PharmD, MPH, BCPS, CPP Clinical Pharmacist (Rheumatology and Pulmonology)

## 2022-11-05 DIAGNOSIS — E291 Testicular hypofunction: Secondary | ICD-10-CM | POA: Diagnosis not present

## 2022-11-05 NOTE — Telephone Encounter (Signed)
Spoke with patient. He states he will complete Xolair PAP forms and plan to drop off at clinic hopefully at some point this week. Has been advised to let front desk staff know that forms are for the pharmacy team.  Chesley Mires, PharmD, MPH, BCPS, CPP Clinical Pharmacist (Rheumatology and Pulmonology)

## 2022-11-06 DIAGNOSIS — K449 Diaphragmatic hernia without obstruction or gangrene: Secondary | ICD-10-CM | POA: Diagnosis not present

## 2022-11-06 DIAGNOSIS — R1310 Dysphagia, unspecified: Secondary | ICD-10-CM | POA: Diagnosis not present

## 2022-11-06 DIAGNOSIS — K222 Esophageal obstruction: Secondary | ICD-10-CM | POA: Diagnosis not present

## 2022-11-06 DIAGNOSIS — K209 Esophagitis, unspecified without bleeding: Secondary | ICD-10-CM | POA: Diagnosis not present

## 2022-11-10 NOTE — Telephone Encounter (Signed)
Received signed pt portion (placed in awaiting response folder), Provider portion placed in Dr. Kavin Leech mailbox as we have not received it.  Chesley Mires, PharmD, MPH, BCPS, CPP Clinical Pharmacist (Rheumatology and Pulmonology)

## 2022-11-12 DIAGNOSIS — K209 Esophagitis, unspecified without bleeding: Secondary | ICD-10-CM | POA: Diagnosis not present

## 2022-11-13 ENCOUNTER — Other Ambulatory Visit: Payer: Self-pay | Admitting: Emergency Medicine

## 2022-11-13 DIAGNOSIS — L821 Other seborrheic keratosis: Secondary | ICD-10-CM | POA: Diagnosis not present

## 2022-11-13 DIAGNOSIS — L814 Other melanin hyperpigmentation: Secondary | ICD-10-CM | POA: Diagnosis not present

## 2022-11-13 DIAGNOSIS — D1801 Hemangioma of skin and subcutaneous tissue: Secondary | ICD-10-CM | POA: Diagnosis not present

## 2022-11-13 DIAGNOSIS — C44519 Basal cell carcinoma of skin of other part of trunk: Secondary | ICD-10-CM | POA: Diagnosis not present

## 2022-11-13 DIAGNOSIS — D2271 Melanocytic nevi of right lower limb, including hip: Secondary | ICD-10-CM | POA: Diagnosis not present

## 2022-11-13 DIAGNOSIS — D225 Melanocytic nevi of trunk: Secondary | ICD-10-CM | POA: Diagnosis not present

## 2022-11-24 NOTE — Telephone Encounter (Signed)
Submitted Patient Assistance Application to [place] for [place] along with provider portion, PA and [employer]. Will update patient when we receive a response.  Phone #: [phone] Fax #: [phone]

## 2022-12-01 NOTE — Telephone Encounter (Signed)
Received a fax from  [place] regarding an approval for XOLAIR patient assistance from 11/26/22 until therapy is discontinued, health insurance or financial status changes, or patient no longer meets program eligibility requirements. Approval letter sent to scan center.  Phone #: [phone] Fax #: [phone] [facility] phone: [phone]  ATC patient to discuss. Unable to reach and unableto leave VM. Will need first three doses completed in clinic with Epipen. First dose has 2 hour monitoring period. Second/third doses have 30 minute monitoring period.  Will f/u with patient  Chesley Mires, PharmD, MPH, BCPS, CPP Clinical Pharmacist (Rheumatology and Pulmonology)

## 2022-12-07 ENCOUNTER — Other Ambulatory Visit: Payer: Self-pay | Admitting: Emergency Medicine

## 2022-12-08 MED ORDER — EPINEPHRINE 0.3 MG/0.3ML IJ SOAJ: 0.3000 mg | Freq: Once | INTRAMUSCULAR | 0 refills | Status: AC | PRN

## 2022-12-08 NOTE — Patient Instructions (Addendum)
See you again on 12/30/22 - this will require 30 minutes of monitoring Your EPIPEN is still REQUIRED at the visit!  ANAPHYLACTIC REACTIONS:  Anaphylactic reactions can occur up to 24 hours after administration.  Symptoms can vary from a mild skin reaction to more severe reactions including: Wheezing, shortness of breath, cough, chest tightness or trouble breathing Low blood pressure, dizziness, fainting, rapid of weak heartbeat, anxiety, or feeling of "impending doom" Flushing, itching, hives, or feeling warm Swelling of the throat or tongue, throat tightness, hoarse voice, or trouble swallowing  Some of these symptoms require immediate treatment, as they can be life threatening.  If you experience severe symptoms which are bolded above use your Epipen and call 911 for immediate emergency care.  INJECTION SITE REACTION MANAGEMENT: COLD - place something cold (like an ice gel pack or cold water bottle) on the injection site just before cleansing with alcohol. This may help reduce pain CLARITIN - use Claritin (generic name is loratadine) for the first two weeks of treatment or the day of, the day before, and the day after injecting. This will help to minimize injection site reactions CORTISONE CREAM - apply if injection site is irritated and itching CALL ME - if injection site reaction is bigger than the size of your fist, looks infected, blisters, or if you develop hives

## 2022-12-08 NOTE — Progress Notes (Signed)
HPI Patient presents today to [facility] Mont[facility] to see pharmacy team for Xolair new start.  Past medical history includes severe persistent allergic asthma. PMH also includes allergic rhinitis, GERD, HLD, sigmoid colon cancer s/p sigmoid colectomy, CAD, STEMI, ischemic cardiomyopathy  Denies asthma or significant allergies during childhood.  Epipen on hand and in date: Yes  Respiratory Medications Current regimen: Symbicort 160-4.53mcg 2 puffs twice daily Patient reports no known adherence challenges  OBJECTIVE Allergies  Allergen Reactions   Albuterol Other (See Comments)    Caused heart attack   Azithromycin Other (See Comments)    Thrush    Outpatient Encounter Medications as of 12/16/2022  Medication Sig   budesonide-formoterol (SYMBICORT) 160-4.5 MCG/ACT inhaler Inhale 2 puffs into the lungs 2 (two) times daily.   clopidogrel (PLAVIX) 75 MG tablet Take 1 tablet (75 mg total) by mouth daily.   EPINEPHrine (EPIPEN 2-PAK) 0.3 mg/0.3 mL IJ SOAJ injection Inject 0.3 mg into the skin once as needed for anaphylaxis.   fluticasone (FLONASE) 50 MCG/ACT nasal spray Use 1 spray(s) in each nostril once daily   furosemide (LASIX) 20 MG tablet Take 1 tablet (20 mg total) by mouth daily as needed for fluid or edema.   levalbuterol (XOPENEX HFA) 45 MCG/ACT inhaler Inhale 1-2 puffs into the lungs every 6 (six) hours as needed for wheezing.   levalbuterol (XOPENEX) 0.63 MG/3ML nebulizer solution Take 3 mLs (0.63 mg total) by nebulization every 6 (six) hours as needed for wheezing or shortness of breath.   loratadine (CLARITIN) 10 MG tablet Take 1 tablet by mouth once daily   losartan (COZAAR) 50 MG tablet Take 1.5 tablets (75 mg total) by mouth daily.   nitroGLYCERIN (NITROSTAT) 0.4 MG SL tablet DISSOLVE ONE TABLET UNDER THE TONGUE EVERY 5 MINUTES AS NEEDED FOR CHEST PAIN.  DO NOT EXCEED A TOTAL OF 3 DOSES IN 15 MINUTES   pantoprazole (PROTONIX) 40 MG tablet TAKE 1 TABLET BY MOUTH  DAILY    rosuvastatin (CRESTOR) 40 MG tablet Take 1 tablet (40 mg total) by mouth daily.   No facility-administered encounter medications on file as of 12/16/2022.     Immunization History  Administered Date(s) Administered   Influenza Split 06/20/2013, 05/01/2014   Influenza, High Dose Seasonal PF 04/28/2019, 05/29/2020   Influenza,inj,Quad PF,6+ Mos 05/02/2014, 05/06/2017, 05/17/2018   PFIZER(Purple Top)SARS-COV-2 Vaccination 08/11/2019, 09/01/2019   Pneumococcal Polysaccharide-23 06/20/2013, 05/02/2014, 04/28/2019   Td 05/02/2008   Tdap 05/17/2018   Zoster, Live 05/16/2019, 07/19/2019     PFTs     No data to display          Eosinophils Most recent blood eosinophil count was 300 cells/microL taken on 09/17/22.   IgE: 333 on 09/17/22 Pre-treatment weight:   Assessment   Biologics training for omalizumab (Xolair)  Goals of therapy: Mechanism: IgG monoclonal antibody (recombinant DNA derived) which inhibits IgE binding to the high-affinity IgE receptor on mast cells and basophils.  Reviewed that Xolair is add-on medication and patient must continue maintenance inhaler regimen. Response to therapy: may take 3 to 6 months to determine efficacy. Discussed that patients generally feel improvement sooner than 3 months.  Side effects: anaphylaxis (0.1%) (black boxed warning), injection site reaction (45%), arthralgia (2.9% to 8%), headache (3% to 15%)  Dose: 300mg  every 2 weeks based on pre-treatment IgE of 333 and weight of 77.4 kg. Patient advised that each dose will be 2 purple and no blue pre-filled syringes.  Administration/Storage:  Reviewed administration sites of thigh or abdomen (at  least 2-3 inches away from abdomen). Reviewed the upper arm is only appropriate if caregiver is administering injection. Patient advised of injection site reaction management. Discussed that injections may take 5 to 10 seconds to administer (solution is slightly viscous). Do not inject into moles,  scars, bruises, tender areas, or broken skin. Do not shake pre-filled syringe as this could lead to product foaming or precipitation. Do not use if solution is discolored or contains particulate matter or if window on prefilled pen is yellow (indicates pen has been used).  Reviewed storage of medication in refrigerator. Reviewed that Xolair can be stored at room temperature in unopened carton for up to 4 hours only.  Access: Approval of Xolair through: patient assistance  Patient self-administered Xolair 150mg /mL x 2 syringes in right lower abdomen and left lower abdomen using sample Xolair 150mg /mL pre-filled syringe x 2 syringes NDC: [phone] Lot: [id number] Expiration: 02/2023  Patient monitored for 2 hours for adverse reaction (checked in with patient every 20 minutes).  Patient tolerated without issue. Injection site checked and no redness or swelling noted. Administered at 1350:  no issues 1410: no issues, patient denies any breathing changes or hives 1430: no issues, patient reading book 1450: doing well, no complaints 1510: doing well, patient on laptop with headphones 1530: patient doing pleasantly well 1550: no issues   Medication Reconciliation  A drug regimen assessment was performed, including review of allergies, interactions, disease-state management, dosing and immunization history. Medications were reviewed with the patient, including name, instructions, indication, goals of therapy, potential side effects, importance of adherence, and safe use.  Drug interaction(s): none noted   PLAN Second Xolair dose will be administered in clinic with 30 minute monitoring period. Scheduled for 12/30/22. Patient advised that Epipen must be on hand at each visit. Third Xolair will be administered in clinic with 30 minute monitoring period - appointment will be scheduled at second-dose visit. Rx sent to:  [facility] .  Patient provided with pharmacy phone number and advised  to call later this week to schedule shipment to clinic. Patient may need to provide consent.  Pharmacy team will follow-up with specialty pharmacy to schedule shipment to clinic. Continue maintenance asthma regimen of: Symbicort 160-4.17mcg 2 puffs twice daily  All questions encouraged and answered.  Instructed patient to reach out with any further questions or concerns.  Thank you for allowing pharmacy to participate in this patient's care.  This appointment required 130 minutes of patient care (this includes precharting, chart review, review of results, face-to-face care, etc.).

## 2022-12-08 NOTE — Telephone Encounter (Signed)
Spoke with patient regarding Xolair new start. Rx for Epipen sent to pharmacy  First dose appointment scheduled for 12/16/22. Patient aware that Epipen is required and that first dose is 2 hour monitoring period.  Jerry Montoya, PharmD, MPH, BCPS, CPP Clinical Pharmacist (Rheumatology and Pulmonology)

## 2022-12-16 ENCOUNTER — Ambulatory Visit: Payer: Medicare HMO | Admitting: Pharmacist

## 2022-12-16 DIAGNOSIS — Z7189 Other specified counseling: Secondary | ICD-10-CM

## 2022-12-16 DIAGNOSIS — J45909 Unspecified asthma, uncomplicated: Secondary | ICD-10-CM

## 2022-12-16 DIAGNOSIS — C44519 Basal cell carcinoma of skin of other part of trunk: Secondary | ICD-10-CM | POA: Diagnosis not present

## 2022-12-16 MED ORDER — OMALIZUMAB 150 MG/ML ~~LOC~~ SOSY: 300.0000 mg | PREFILLED_SYRINGE | SUBCUTANEOUS | 0 refills | Status: DC

## 2022-12-19 ENCOUNTER — Telehealth: Payer: Self-pay | Admitting: Pharmacist

## 2022-12-19 NOTE — Telephone Encounter (Signed)
Called [facility] to schedule Xolair shipment to clinic. Shipment scheduled for 12/25/22 - signature required.  Phone: [phone] Confirmation # [id number]  Chesley Mires, PharmD, MPH, BCPS, CPP Clinical Pharmacist (Rheumatology and Pulmonology)

## 2022-12-25 NOTE — Telephone Encounter (Signed)
Xolair received from [facility]. Stored in [facility]  Chesley Mires, PharmD, MPH, BCPS, CPP Clinical Pharmacist (Rheumatology and Pulmonology)

## 2022-12-29 NOTE — Progress Notes (Signed)
HPI Patient presents today to [facility] to see pharmacy team for second dose of Xolair.  Past medical history includes severe persistent allergic asthma, GERD, and allergic rhinitis. Patient stated that he was recently diagnosed with osteoporosis.  Patient completed their first dose on 12/16/22 with two hour monitoring period. Patient tolerated first dose well with minor bruising on left side that has slight yellow/purple color remaining.  Epipen on hand and in date: Yes  Number of hospitalizations in past year: 0 Number of asthma exacerbations in past year: 2-3  Respiratory Medications Current regimen: Symbicort 160-4.5 mcg 2 puff twice daily; Xopenex inhaler or nebulizer PRN shortness of breath, chest tightness & wheezing. Patient reports no known adherence challenges.  OBJECTIVE Allergies  Allergen Reactions   Albuterol Other (See Comments)    Caused heart attack   Azithromycin Other (See Comments)    Thrush    Outpatient Encounter Medications as of 12/30/2022  Medication Sig   budesonide-formoterol (SYMBICORT) 160-4.5 MCG/ACT inhaler Inhale 2 puffs into the lungs 2 (two) times daily.   clopidogrel (PLAVIX) 75 MG tablet Take 1 tablet (75 mg total) by mouth daily.   EPINEPHrine (EPIPEN 2-PAK) 0.3 mg/0.3 mL IJ SOAJ injection Inject 0.3 mg into the skin once as needed for anaphylaxis.   fluticasone (FLONASE) 50 MCG/ACT nasal spray Use 1 spray(s) in each nostril once daily   levalbuterol (XOPENEX HFA) 45 MCG/ACT inhaler Inhale 1-2 puffs into the lungs every 6 (six) hours as needed for wheezing.   levalbuterol (XOPENEX) 0.63 MG/3ML nebulizer solution Take 3 mLs (0.63 mg total) by nebulization every 6 (six) hours as needed for wheezing or shortness of breath.   loratadine (CLARITIN) 10 MG tablet Take 1 tablet by mouth once daily   losartan (COZAAR) 50 MG tablet Take 1.5 tablets (75 mg total) by mouth daily.   nitroGLYCERIN (NITROSTAT) 0.4 MG SL tablet DISSOLVE ONE TABLET  UNDER THE TONGUE EVERY 5 MINUTES AS NEEDED FOR CHEST PAIN.  DO NOT EXCEED A TOTAL OF 3 DOSES IN 15 MINUTES   omalizumab (XOLAIR) 150 MG/ML prefilled syringe Inject 300 mg into the skin every 14 (fourteen) days. Deliver to clinic: [address], [place], [place] [address]   pantoprazole (PROTONIX) 40 MG tablet TAKE 1 TABLET BY MOUTH  DAILY   rosuvastatin (CRESTOR) 40 MG tablet Take 1 tablet (40 mg total) by mouth daily.   No facility-administered encounter medications on file as of 12/30/2022.     Immunization History  Administered Date(s) Administered   Influenza Split 06/20/2013, 05/01/2014   Influenza, High Dose Seasonal PF 04/28/2019, 05/29/2020   Influenza,inj,Quad PF,6+ Mos 05/02/2014, 05/06/2017, 05/17/2018   PFIZER(Purple Top)SARS-COV-2 Vaccination 08/11/2019, 09/01/2019   Pneumococcal Polysaccharide-23 06/20/2013, 05/02/2014, 04/28/2019   Td 05/02/2008   Tdap 05/17/2018   Zoster, Live 05/16/2019, 07/19/2019     PFTs     No data to display           Eosinophils Most recent blood eosinophil count was 300 cells/microL taken on 09/17/22.   IgE: 333 on 09/18/22 Pre-treatment weight: 77.4 kg   Assessment   Biologics training for omalizumab (Xolair)  Patient was thoroughly counseled on goals of therapy, dosing, side effects, and administration technique at first dose visit.  Patient self-administered Xolair 150mg /mL x 2 in right lower abdomen and left lower abdomen using pharmacy supplied medication.  Xolair 150mg /mL pre-filled syringe x 2 NDC: 60454-098-11 Lot: [id number] Expiration: 01/2024  Patient monitored for 30 minutes for adverse reaction.  Patient tolerated Xolair  without issue. Injection site checked and no apparent immediate reaction noted.  Medication Reconciliation  A drug regimen assessment was performed, including review of allergies, interactions, disease-state management, dosing and immunization history. Medications were reviewed with the  patient, including name, instructions, indication, goals of therapy, potential side effects, importance of adherence, and safe use.  Drug interaction(s): none noted  Immunizations  Patient is NOT indicated for the influenzae, pneumonia, and shingles vaccinations. Patient has received 2 COVID19 vaccines.   PLAN Third Xolair dose will be administered in clinic with 30 minute monitoring period. Scheduled for 01/13/23 at 2:20 pm. Patient advised that Epipen must be on hand at each visit.  Shipment for third dose is already stored in [facility]. Continue maintenance asthma regimen of: Symbicort 160-4.5 mcg 2 puff twice daily; Xopenex inhaler or nebulizer as needed for shortness of breath, chest tightness & wheezing.  All questions encouraged and answered.  Instructed patient to reach out with any further questions or concerns.  Thank you for allowing pharmacy to participate in this patient's care.  This appointment required 50 minutes of patient care (this includes precharting, chart review, review of results, face-to-face care, etc.).

## 2022-12-29 NOTE — Patient Instructions (Incomplete)
Your next *** dose is due on ***, ***, and every *** days thereafter  {[email or web address]:[id number]}  Your prescription will be shipped from ***. Their phone number is *** Please call to schedule shipment and confirm address. They will mail your medication to your home.  Your copay should be affordable. If you call the pharmacy and it is not affordable, please double-check that they are billing through your copay card as secondary coverage. That copay card information is: ID: *** BIN: *** PCN: *** Group Number: ***  You will need to be seen by your provider in 3 to 4 months to assess how *** is working for you. Please ensure you have a follow-up appointment scheduled in ***. Call our clinic if you need to make this appointment.  How to manage an injection site reaction: Remember the 5 C's: COUNTER - leave on the counter at least 30 minutes but up to overnight to bring medication to room temperature. This may help prevent stinging COLD - place something cold (like an ice gel pack or cold water bottle) on the injection site just before cleansing with alcohol. This may help reduce pain CLARITIN - use Claritin (generic name is loratadine) for the first two weeks of treatment or the day of, the day before, and the day after injecting. This will help to minimize injection site reactions CORTISONE CREAM - apply if injection site is irritated and itching CALL ME - if injection site reaction is bigger than the size of your fist, looks infected, blisters, or if you develop hives

## 2022-12-30 ENCOUNTER — Ambulatory Visit: Payer: Medicare HMO | Admitting: Pharmacist

## 2022-12-30 DIAGNOSIS — Z7189 Other specified counseling: Secondary | ICD-10-CM

## 2023-01-09 NOTE — Progress Notes (Unsigned)
HPI Patient presents today to [facility] to see pharmacy team for third dose of Xolair.  Past medical history includes severe persistent allergic asthma, GERD and allergic rhinitis.  Patient completed their first dose on 12/16/22 with two hour monitoring period. Patient tolerated first dose well with minor bruising on left side. Patient completed second dose on 12/30/22 with 30 minute monitoring period. Patient tolerated second dose well with no side effects.  Epipen on hand and in date: Yes  Number of hospitalizations in past year: 0 Number of asthma exacerbations in past year: 2-3  Respiratory Medications Current regimen: Symbicort 160-4.5 mcg 2 puff twice daily; Xopenex inhaler or nebulizer PRN shortness of breath, chest tightness & wheezing  Patient reports no known adherence challenges  OBJECTIVE Allergies  Allergen Reactions   Albuterol Other (See Comments)    Caused heart attack   Azithromycin Other (See Comments)    Thrush    Outpatient Encounter Medications as of 01/13/2023  Medication Sig   budesonide-formoterol (SYMBICORT) 160-4.5 MCG/ACT inhaler Inhale 2 puffs into the lungs 2 (two) times daily.   clopidogrel (PLAVIX) 75 MG tablet Take 1 tablet (75 mg total) by mouth daily.   EPINEPHrine (EPIPEN 2-PAK) 0.3 mg/0.3 mL IJ SOAJ injection Inject 0.3 mg into the skin once as needed for anaphylaxis.   fluticasone (FLONASE) 50 MCG/ACT nasal spray Use 1 spray(s) in each nostril once daily   levalbuterol (XOPENEX HFA) 45 MCG/ACT inhaler Inhale 1-2 puffs into the lungs every 6 (six) hours as needed for wheezing.   levalbuterol (XOPENEX) 0.63 MG/3ML nebulizer solution Take 3 mLs (0.63 mg total) by nebulization every 6 (six) hours as needed for wheezing or shortness of breath.   loratadine (CLARITIN) 10 MG tablet Take 1 tablet by mouth once daily   losartan (COZAAR) 50 MG tablet Take 1.5 tablets (75 mg total) by mouth daily.   nitroGLYCERIN (NITROSTAT) 0.4 MG SL tablet  DISSOLVE ONE TABLET UNDER THE TONGUE EVERY 5 MINUTES AS NEEDED FOR CHEST PAIN.  DO NOT EXCEED A TOTAL OF 3 DOSES IN 15 MINUTES   omalizumab (XOLAIR) 150 MG/ML prefilled syringe Inject 300 mg into the skin every 14 (fourteen) days. Deliver to clinic: [address], [place], [place] [address]   pantoprazole (PROTONIX) 40 MG tablet TAKE 1 TABLET BY MOUTH  DAILY   rosuvastatin (CRESTOR) 40 MG tablet Take 1 tablet (40 mg total) by mouth daily.   No facility-administered encounter medications on file as of 01/13/2023.     Immunization History  Administered Date(s) Administered   Influenza Split 06/20/2013, 05/01/2014   Influenza, High Dose Seasonal PF 04/28/2019, 05/29/2020   Influenza,inj,Quad PF,6+ Mos 05/02/2014, 05/06/2017, 05/17/2018   PFIZER(Purple Top)SARS-COV-2 Vaccination 08/11/2019, 09/01/2019   Pneumococcal Polysaccharide-23 06/20/2013, 05/02/2014, 04/28/2019   Td 05/02/2008   Tdap 05/17/2018   Zoster, Live 05/16/2019, 07/19/2019     PFTs     No data to display           Eosinophils Most recent blood eosinophil count was 300 cells/microL taken on 09/17/22.   IgE: 333 on 09/18/22 Pre-treatment weight: 77.4 kg   Assessment   Biologics training for omalizumab (Xolair)  Patient was thoroughly counseled on goals of therapy, dosing, side effects, and administration technique at first dose visit.  Patient self-administered Xolair 150mg /mL x 2 in right lower abdomen and in left lower abdomen using sample Xolair 150mg /mL pre-filled syringe x 2 NDC: 60454-098-11 Lot: [id number] Expiration: 01/2024  Patient monitored for 30 minutes for adverse reaction.  Patient tolerated Xolair. Injection site checked and no acute reaction apparent.  Medication Reconciliation  A drug regimen assessment was performed, including review of allergies, interactions, disease-state management, dosing and immunization history. Medications were reviewed with the patient, including name,  instructions, indication, goals of therapy, potential side effects, importance of adherence, and safe use.  Drug interaction(s): no drug interactions  Immunizations  Patient is up to date for the influenzae, pneumonia, and shingles vaccinations. Patient has received 2 COVID19 vaccines.   PLAN Rx sent to: [facility]): [phone].  Patient provided with pharmacy phone number and advised to call later this week to schedule shipment to home for self-administration. Patient has already been approved for PAP. Will continue Xolair 300mg  SQ every 14 days Pharmacy team has called pharmacy today and provided consent to ship Xolair directly to patient's home moving forward. Continue maintenance asthma regimen of: Symbicort 160-4.5 mcg 2 puff twice daily; Xopenex inhaler or nebulizer PRN shortness of breath, chest tightness & wheezing.  All questions encouraged and answered.  Instructed patient to reach out with any further questions or concerns.  Thank you for allowing pharmacy to participate in this patient's care.  This appointment required 50 minutes of patient care (this includes precharting, chart review, review of results, face-to-face care, etc.).  Darolyn Rua, PharmD Student [employer]

## 2023-01-13 ENCOUNTER — Ambulatory Visit: Payer: Medicare HMO | Admitting: Pharmacist

## 2023-01-13 DIAGNOSIS — Z7189 Other specified counseling: Secondary | ICD-10-CM

## 2023-01-13 DIAGNOSIS — J45909 Unspecified asthma, uncomplicated: Secondary | ICD-10-CM

## 2023-01-13 MED ORDER — OMALIZUMAB 150 MG/ML ~~LOC~~ SOSY: 300.0000 mg | PREFILLED_SYRINGE | SUBCUTANEOUS | 1 refills | Status: DC

## 2023-01-13 NOTE — Patient Instructions (Signed)
Your next Xolair dose is due on 01/24/23, 02/10/23, and every 14 days thereafter  CONTINUE Symbicort 160-4.5 mcg 2 puff twice daily; Xopenex inhaler or nebulizer PRN shortness of breath, chest tightness & wheezing.  Your prescription will be shipped from [facility]). Their phone number is [phone]. Please call to schedule shipment and confirm address. They will mail your medication to your home.   You will need to be seen by your provider in 3 to 4 months to assess how Xolair is working for you. Please ensure you have a follow-up appointment scheduled in 3-4 months. Call our clinic if you need to make this appointment.  How to manage an injection site reaction: Remember the 5 C's: COUNTER - leave on the counter at least 30 minutes but up to overnight to bring medication to room temperature. This may help prevent stinging COLD - place something cold (like an ice gel pack or cold water bottle) on the injection site just before cleansing with alcohol. This may help reduce pain CLARITIN - use Claritin (generic name is loratadine) for the first two weeks of treatment or the day of, the day before, and the day after injecting. This will help to minimize injection site reactions CORTISONE CREAM - apply if injection site is irritated and itching CALL ME - if injection site reaction is bigger than the size of your fist, looks infected, blisters, or if you develop hives

## 2023-01-30 ENCOUNTER — Telehealth: Payer: Self-pay | Admitting: Pharmacist

## 2023-01-30 NOTE — Telephone Encounter (Signed)
pt is calling about questions about his omalizumab Jerry Montoya) 150 MG/ML prefilled syringe

## 2023-02-02 NOTE — Telephone Encounter (Signed)
Spoke with patient who stated his medication was never delivered on the date it was supposed to be. He spoke with the pharmacy who told him [employer] attempted to deliver it on 7/3, but no one was home, so [facility]. [facility] told [facility] they never [facility], though.  Pharmacy is re-sending the medication and it is expected to arrive to patient tomorrow (7/16). Patient will restart the medication when he receives it, and will administer it every 2 weeks from that date.

## 2023-02-11 ENCOUNTER — Other Ambulatory Visit: Payer: Self-pay | Admitting: Emergency Medicine

## 2023-02-16 DIAGNOSIS — H5203 Hypermetropia, bilateral: Secondary | ICD-10-CM | POA: Diagnosis not present

## 2023-02-16 DIAGNOSIS — H2513 Age-related nuclear cataract, bilateral: Secondary | ICD-10-CM | POA: Diagnosis not present

## 2023-02-16 DIAGNOSIS — H43813 Vitreous degeneration, bilateral: Secondary | ICD-10-CM | POA: Diagnosis not present

## 2023-02-25 ENCOUNTER — Other Ambulatory Visit: Payer: Self-pay | Admitting: Gastroenterology

## 2023-02-25 DIAGNOSIS — R1310 Dysphagia, unspecified: Secondary | ICD-10-CM

## 2023-02-26 ENCOUNTER — Ambulatory Visit
Admission: RE | Admit: 2023-02-26 | Discharge: 2023-02-26 | Disposition: A | Discharge status: Home / Self Care | Payer: Medicare HMO | Source: Ambulatory Visit | Attending: Gastroenterology | Admitting: Gastroenterology

## 2023-02-26 DIAGNOSIS — R1310 Dysphagia, unspecified: Secondary | ICD-10-CM

## 2023-02-26 DIAGNOSIS — K449 Diaphragmatic hernia without obstruction or gangrene: Secondary | ICD-10-CM | POA: Diagnosis not present

## 2023-02-26 DIAGNOSIS — K219 Gastro-esophageal reflux disease without esophagitis: Secondary | ICD-10-CM | POA: Diagnosis not present

## 2023-03-05 DIAGNOSIS — J4530 Mild persistent asthma, uncomplicated: Secondary | ICD-10-CM | POA: Diagnosis not present

## 2023-03-05 DIAGNOSIS — N281 Cyst of kidney, acquired: Secondary | ICD-10-CM | POA: Diagnosis not present

## 2023-03-05 DIAGNOSIS — Z125 Encounter for screening for malignant neoplasm of prostate: Secondary | ICD-10-CM | POA: Diagnosis not present

## 2023-03-05 DIAGNOSIS — I2510 Atherosclerotic heart disease of native coronary artery without angina pectoris: Secondary | ICD-10-CM | POA: Diagnosis not present

## 2023-03-05 DIAGNOSIS — E291 Testicular hypofunction: Secondary | ICD-10-CM | POA: Diagnosis not present

## 2023-03-05 DIAGNOSIS — E785 Hyperlipidemia, unspecified: Secondary | ICD-10-CM | POA: Diagnosis not present

## 2023-03-05 DIAGNOSIS — K222 Esophageal obstruction: Secondary | ICD-10-CM | POA: Diagnosis not present

## 2023-03-05 DIAGNOSIS — R7303 Prediabetes: Secondary | ICD-10-CM | POA: Diagnosis not present

## 2023-03-05 DIAGNOSIS — I10 Essential (primary) hypertension: Secondary | ICD-10-CM | POA: Diagnosis not present

## 2023-03-05 DIAGNOSIS — I255 Ischemic cardiomyopathy: Secondary | ICD-10-CM | POA: Diagnosis not present

## 2023-03-05 DIAGNOSIS — I700 Atherosclerosis of aorta: Secondary | ICD-10-CM | POA: Diagnosis not present

## 2023-03-05 DIAGNOSIS — M810 Age-related osteoporosis without current pathological fracture: Secondary | ICD-10-CM | POA: Diagnosis not present

## 2023-03-31 ENCOUNTER — Other Ambulatory Visit: Payer: Self-pay | Admitting: Emergency Medicine

## 2023-04-28 ENCOUNTER — Telehealth: Payer: Self-pay | Admitting: Pharmacist

## 2023-04-28 DIAGNOSIS — J45909 Unspecified asthma, uncomplicated: Secondary | ICD-10-CM

## 2023-04-28 MED ORDER — OMALIZUMAB 150 MG/ML ~~LOC~~ SOSY: 300.0000 mg | PREFILLED_SYRINGE | SUBCUTANEOUS | 1 refills | Status: DC

## 2023-04-28 NOTE — Telephone Encounter (Signed)
Received refill request from [facility] for patient's Xolair.  Patient due for f/u appt with Dr. Delton Coombes,. Routing to scheduling team

## 2023-04-28 NOTE — Telephone Encounter (Signed)
[facility] to schedule follow up appointment

## 2023-05-25 DIAGNOSIS — M810 Age-related osteoporosis without current pathological fracture: Secondary | ICD-10-CM | POA: Diagnosis not present

## 2023-06-08 ENCOUNTER — Other Ambulatory Visit: Payer: Self-pay | Admitting: Emergency Medicine

## 2023-06-16 DIAGNOSIS — J069 Acute upper respiratory infection, unspecified: Secondary | ICD-10-CM | POA: Diagnosis not present

## 2023-06-16 DIAGNOSIS — I10 Essential (primary) hypertension: Secondary | ICD-10-CM | POA: Diagnosis not present

## 2023-06-16 DIAGNOSIS — R051 Acute cough: Secondary | ICD-10-CM | POA: Diagnosis not present

## 2023-06-16 DIAGNOSIS — J40 Bronchitis, not specified as acute or chronic: Secondary | ICD-10-CM | POA: Diagnosis not present

## 2023-07-01 ENCOUNTER — Encounter: Payer: Self-pay | Admitting: Emergency Medicine

## 2023-07-01 ENCOUNTER — Ambulatory Visit: Payer: Medicare HMO | Admitting: Emergency Medicine

## 2023-07-01 VITALS — BP 122/74 | HR 75 | Temp 98.1°F | Ht 70.0 in | Wt 167.0 lb

## 2023-07-01 DIAGNOSIS — J301 Allergic rhinitis due to pollen: Secondary | ICD-10-CM

## 2023-07-01 DIAGNOSIS — J45909 Unspecified asthma, uncomplicated: Secondary | ICD-10-CM | POA: Diagnosis not present

## 2023-07-01 DIAGNOSIS — K219 Gastro-esophageal reflux disease without esophagitis: Secondary | ICD-10-CM | POA: Diagnosis not present

## 2023-07-01 NOTE — Assessment & Plan Note (Signed)
Both allergies and asthma have benefited from addition of Xolair.  Continue to use it and work on controlling allergic rhinitis, GERD.  Continue same maintenance regimen  Continue Symbicort 2 puffs a day.  Rinse and gargle after using. Keep albuterol available to use 2 puffs up to every 4 hours if needed for shortness of breath, chest tightness, wheezing.

## 2023-07-01 NOTE — Progress Notes (Signed)
   Subjective:    Patient ID: Jerry Montoya, male    DOB: 17-Jun-1956, 67 y.o.   MRN: [id number]  HPI  ROV 10/23/22 --Mr. Jerry Montoya is 15 with a history of longstanding asthma, never smoker.  Also with a history of CAD with ischemic cardiomyopathy, colon cancer, hypertension, GERD and chronic rhinitis.  Occasional choking/aspiration/dysphagia symptoms. He is on Symbicort, uses several times a week. Usually for wheeze and tightness. Remains on flonase and loratadine.  He was acutely ill in February, was treated for sinusitis and acute exacerbation of his asthma.  In retrospect he has flares about twice annually that require corticosteroids.  IgE 09/17/2022 333, relative eosinophils 4.7%, absolute 0.3  ROV 07/01/2023 --follow-up visit for 67 year old male with history of longstanding asthma, coronary disease with ischemic cardiomyopathy, colon cancer, hypertension, GERD, chronic rhinitis.  After I saw him in April we referred him to initiate Xolair to help with his allergy and asthma.  Today he reports that he believes that the xolair has been helpful, has decreased his albuterol use, better breathing - although he has had more congestion last month with clearing the leaves. He is exercising more. The xolair comes [employer] - has had some difficulty getting it signed for. He is working w the company to correct this. Remains on Symbicort, flonase, protonix. He was on prednisone in November after a URI.    Review of Systems As per HPI      Objective:   Physical Exam Vitals:   07/01/23 0955  BP: 122/74  Pulse: 75  Temp: 98.1 F (36.7 C)  TempSrc: Oral  SpO2: 95%  Weight: 167 lb (75.8 kg)  Height: 5\' 10"  (1.778 m)   Gen: Pleasant, well-nourished, in no distress,  normal affect  ENT: No lesions,  mouth clear,  oropharynx clear, no postnasal drip  Neck: No JVD, loud stridor on exp  Lungs: No use of accessory muscles, no crackles or wheezing on normal respiration, no wheeze on forced expiration,  referred UA noise  Cardiovascular: RRR, heart sounds normal, no murmur or gallops, no peripheral edema  Musculoskeletal: No deformities, no cyanosis or clubbing  Neuro: alert, awake, non focal  Skin: Warm, no lesions or rash      Assessment & Plan:  Allergic rhinitis Please continue Xolair on your current schedule, every 14 days We will recheck your IgE and your eosinophil count at your next office visit Continue Flonase as you have been taking it Follow Dr. Delton Coombes or APP in 6 months so we can assess your status on the Xolair  Asthma Both allergies and asthma have benefited from addition of Xolair.  Continue to use it and work on controlling allergic rhinitis, GERD.  Continue same maintenance regimen  Continue Symbicort 2 puffs a day.  Rinse and gargle after using. Keep albuterol available to use 2 puffs up to every 4 hours if needed for shortness of breath, chest tightness, wheezing.  GERD (gastroesophageal reflux disease) Continue PPI   Levy Pupa, MD, PhD 07/01/2023, 1:17 PM [facility] [phone] or if no answer [phone]

## 2023-07-01 NOTE — Assessment & Plan Note (Signed)
Continue PPI ?

## 2023-07-01 NOTE — Assessment & Plan Note (Signed)
Please continue Xolair on your current schedule, every 14 days We will recheck your IgE and your eosinophil count at your next office visit Continue Flonase as you have been taking it Follow Dr. Delton Coombes or APP in 6 months so we can assess your status on the Xolair

## 2023-07-01 NOTE — Patient Instructions (Addendum)
Please continue Xolair on your current schedule, every 14 days We will recheck your IgE and your eosinophil count at your next office visit Continue Symbicort 2 puffs a day.  Rinse and gargle after using. Keep albuterol available to use 2 puffs up to every 4 hours if needed for shortness of breath, chest tightness, wheezing. Continue Flonase as you have been taking it Continue your pantoprazole as you have been taking it Follow Dr. Delton Coombes or APP in 6 months so we can assess your status on the Xolair

## 2023-07-06 ENCOUNTER — Encounter (HOSPITAL_COMMUNITY): Payer: Self-pay | Admitting: Cardiology

## 2023-07-06 ENCOUNTER — Ambulatory Visit (HOSPITAL_COMMUNITY)
Admission: RE | Admit: 2023-07-06 | Discharge: 2023-07-06 | Disposition: A | Discharge status: Home / Self Care | Payer: Medicare HMO | Source: Ambulatory Visit | Attending: Cardiology | Admitting: Cardiology

## 2023-07-06 VITALS — BP 132/84 | HR 82 | Wt 170.6 lb

## 2023-07-06 DIAGNOSIS — Z79899 Other long term (current) drug therapy: Secondary | ICD-10-CM | POA: Insufficient documentation

## 2023-07-06 DIAGNOSIS — I5022 Chronic systolic (congestive) heart failure: Secondary | ICD-10-CM | POA: Diagnosis not present

## 2023-07-06 DIAGNOSIS — Z9049 Acquired absence of other specified parts of digestive tract: Secondary | ICD-10-CM | POA: Insufficient documentation

## 2023-07-06 DIAGNOSIS — I255 Ischemic cardiomyopathy: Secondary | ICD-10-CM | POA: Insufficient documentation

## 2023-07-06 DIAGNOSIS — E785 Hyperlipidemia, unspecified: Secondary | ICD-10-CM | POA: Insufficient documentation

## 2023-07-06 DIAGNOSIS — I25119 Atherosclerotic heart disease of native coronary artery with unspecified angina pectoris: Secondary | ICD-10-CM | POA: Diagnosis not present

## 2023-07-06 DIAGNOSIS — I252 Old myocardial infarction: Secondary | ICD-10-CM | POA: Insufficient documentation

## 2023-07-06 DIAGNOSIS — I25110 Atherosclerotic heart disease of native coronary artery with unstable angina pectoris: Secondary | ICD-10-CM | POA: Diagnosis not present

## 2023-07-06 DIAGNOSIS — J45909 Unspecified asthma, uncomplicated: Secondary | ICD-10-CM | POA: Insufficient documentation

## 2023-07-06 DIAGNOSIS — Z7902 Long term (current) use of antithrombotics/antiplatelets: Secondary | ICD-10-CM | POA: Insufficient documentation

## 2023-07-06 DIAGNOSIS — I10 Essential (primary) hypertension: Secondary | ICD-10-CM | POA: Diagnosis not present

## 2023-07-06 DIAGNOSIS — Z91148 Patient's other noncompliance with medication regimen for other reason: Secondary | ICD-10-CM | POA: Diagnosis not present

## 2023-07-06 LAB — BASIC METABOLIC PANEL
Anion gap: 6 (ref 5–15)
BUN: 18 mg/dL (ref 8–23)
CO2: 24 mmol/L (ref 22–32)
Calcium: 9 mg/dL (ref 8.9–10.3)
Chloride: 111 mmol/L (ref 98–111)
Creatinine, Ser: 0.94 mg/dL (ref 0.61–1.24)
GFR, Estimated: >60 mL/min (ref >60)
Glucose, Bld: 100 mg/dL — ABNORMAL HIGH (ref 70–99)
Potassium: 4 mmol/L (ref 3.5–5.1)
Sodium: 141 mmol/L (ref 135–145)

## 2023-07-06 LAB — LIPID PANEL
Cholesterol: 169 mg/dL (ref 0–200)
HDL: 53 mg/dL (ref >40)
LDL Cholesterol: 107 mg/dL — ABNORMAL HIGH (ref 0–99)
Total CHOL/HDL Ratio: 3.2 {ratio}
Triglycerides: 45 mg/dL (ref <150)
VLDL: 9 mg/dL (ref 0–40)

## 2023-07-06 NOTE — Progress Notes (Signed)
PCP: Dr. Pete Glatter Cardiology: Dr. Shirlee Latch  67 y.o. with history of asthma and colon cancer was admitted in 10/15 with anterior STEMI.  He had fresh total occlusion of the mid LAD and chronic total occlusion of the mid LCx with collaterals.  He had Xience DES to LAD.  Echo that admission showed EF 45-50%.  Repeat echo in 1/16 showed EF up to 55-60%.  In 5/17, he had a Cardiolite that was low risk with a fixed apical inferior defect and no ischemia. In 10/19, he had LHC due to angina showing CTO proximal LCx with collaterals, 60% pLAD, 90% D1 stenosis.  He had DES to D1.   Patient had right inguinal hernia repair in 8/20.   Echo 12/21 showed EF 55-60% with normal wall motion.  With ongoing exertional chest pain and dyspnea, he had LHC in 1/22.  This showed stable chronically occluded LCX with nonobstructive CAD elsewhere.   He saw pulmonology and was started on Symbicort.   Last seen for f/u 10/13 for an acute visit. Noting increased fatigue and dyspnea. Using rescue inhaler more frequently. ReDS 40%. Volume appeared up. Given 20 mg furosemide X 3 days followed by PRN.   CPX 05/07/21: Low normal functional capacity. Ventilatory limitation at peak exercise with moderate obstruction on resting spirometry. No obvious HF limitation.  Patient returns for followup of CAD.  Asthma is now better-controlled.  Noted wheezing when raking leaves.  No dyspnea or chest pain with exertion.  SBP 120s generally at home. He is getting more exercise.   No lightheadedness or palpitations. He is taking all his meds.   Labs (7/23): K 4, creatinine 0.83 Labs (9/23): LDL 166 Labs (10/23): creatinine 0.87  ECG (personally reviewed): NSR, normal  PMH: 1. Asthma: since his 30s.  2. Colon cancer: s/p sigmoid colectomy in 2015.  3. Hyperlipidemia 4. GERD 5. CAD: Anterior STEMI in 10/15, LHC showed total occlusion mid LAD and chronic total occlusion mid LCx with collaterals.  He had Xience DES to the LAD.   -  Cardiolite (5/17) with fixed apical inferior defect, no ischemia.  Low risk study.  EF 58%.  - LHC (10/19) done for angina: CTO proximal LCx with collaterals, 60% pLAD, 90% D1 stenosis.  He had DES to D1.  - LHC (1/22): done for chest tightness/dyspnea. Known occluded LCx with collaterals from the RCA. Stable nonobstructive disease in the LAD and RCA.  6. Ischemic cardiomyopathy: Echo (10/15) with EF 45-50%, anterior/anteroseptal/apical severe hypokinesis.  Echo (1/16) with EF 55-60%.  - Echo (12/19): EF 55-60%, mild MR.  - Echo (01/22): EF 55-60% with normal wall motion.  - CPX (10/22): Low normal functional capacity. Ventilatory limitation at peak exercise with moderate obstruction on resting spirometry. No obvious HF limitation noted.  7. Right inguinal hernia repair 8/20  SH: [employer], married Romio Deford), nonsmoker.   FH: Father with CAD.   ROS: All systems reviewed and negative except as per HPI.   Current Outpatient Medications  Medication Sig Dispense Refill   budesonide-formoterol (SYMBICORT) 160-4.5 MCG/ACT inhaler Inhale 2 puffs into the lungs 2 (two) times daily. 10.2 g 6   clopidogrel (PLAVIX) 75 MG tablet Take 1 tablet (75 mg total) by mouth daily. 90 tablet 3   EPINEPHrine (EPIPEN 2-PAK) 0.3 mg/0.3 mL IJ SOAJ injection Inject 0.3 mg into the skin once as needed for anaphylaxis. 1 each 0   fluticasone (FLONASE) 50 MCG/ACT nasal spray Use 1 spray(s) in each nostril once daily 16 g 0  levalbuterol (XOPENEX HFA) 45 MCG/ACT inhaler Inhale 1-2 puffs into the lungs every 6 (six) hours as needed for wheezing. 1 each 12   levalbuterol (XOPENEX) 0.63 MG/3ML nebulizer solution Take 3 mLs (0.63 mg total) by nebulization every 6 (six) hours as needed for wheezing or shortness of breath. 120 mL 3   loratadine (CLARITIN) 10 MG tablet Take 1 tablet by mouth once daily 30 tablet 0   losartan (COZAAR) 50 MG tablet Take 1.5 tablets (75 mg total) by mouth daily. 135 tablet 3    nitroGLYCERIN (NITROSTAT) 0.4 MG SL tablet DISSOLVE ONE TABLET UNDER THE TONGUE EVERY 5 MINUTES AS NEEDED FOR CHEST PAIN.  DO NOT EXCEED A TOTAL OF 3 DOSES IN 15 MINUTES 25 tablet 0   omalizumab (XOLAIR) 150 MG/ML prefilled syringe Inject 300 mg into the skin every 14 (fourteen) days. Deliver to patient home. Three doses completed in clinic and Epipen on hand. 12 mL 1   pantoprazole (PROTONIX) 40 MG tablet TAKE 1 TABLET BY MOUTH  DAILY 90 tablet 3   rosuvastatin (CRESTOR) 40 MG tablet Take 1 tablet (40 mg total) by mouth daily. 30 tablet 6   tadalafil (CIALIS) 5 MG tablet Take 5 mg by mouth daily.     No current facility-administered medications for this encounter.   Wt Readings from Last 3 Encounters:  07/06/23 77.4 kg (170 lb 9.6 oz)  07/01/23 75.8 kg (167 lb)  10/23/22 77.4 kg (170 lb 9.6 oz)   BP 132/84   Pulse 82   Wt 77.4 kg (170 lb 9.6 oz)   SpO2 96%   BMI 24.48 kg/m  General: NAD Neck: No JVD, no thyromegaly or thyroid nodule.  Lungs: Clear to auscultation bilaterally with normal respiratory effort. CV: Nondisplaced PMI.  Heart regular S1/S2, no S3/S4, no murmur.  No peripheral edema.  No carotid bruit.  Normal pedal pulses.  Abdomen: Soft, nontender, no hepatosplenomegaly, no distention.  Skin: Intact without lesions or rashes.  Neurologic: Alert and oriented x 3.  Psych: Normal affect. Extremities: No clubbing or cyanosis.  HEENT: Normal.   Assessment/Plan: 1. CAD: s/p anterior STEMI 10/15 with DES to mLAD.  Also had CTO mid LCx with collaterals that was not treated percutaneously.  In 10/19, he had crescendo angina.  Cath with 90% D1 stenosis, had DES to D1.  Echo 1/22 EF 55-60%.  LHC 1/22 showed known occluded LCx with collaterals from the RCA; stable nonobstructive disease in the LAD & RCA.  No recent chest pain.  - Continue Plavix 75 mg daily.   - He is off bisoprolol with poor asthma control.  - Continue Crestor 40 mg daily, lipids today.  2. Ischemic cardiomyopathy  with recovery in LV function: EF 45-50% on initial echo, improved to 55-60% in 1/16.  Echo 12/19 with EF 55-60%.  Echo 1/22 showed EF 55-60% with normal RV.  CPX 10/22 showed low normal functional capacity. Ventilatory limitation at peak exercise with moderate obstruction on resting spirometry. No obvious HF limitation noted.  No exertional dyspnea recently.  - Continue losartan 75 mg daily today.  BMET today.  - Off bisoprolol with previous wheezing/asthma. 3. Hyperlipidemia: Continue Crestor, check lipids today.  4. HTN: BP controlled at home.  5. Asthma: Follow-up with Dr. Delton Coombes.  6. Medication noncompliance: He seems to be doing better with taking his meds.    Followup in 6 months.   Marca Ancona 07/06/2023

## 2023-07-06 NOTE — Patient Instructions (Signed)
Good to see you today!  No medication changes were made  Labs done today, your results will be available in MyChart, we will contact you for abnormal readings.  Your physician recommends that you schedule a follow-up appointment in: 6 months(June 2025) Call office in April to schedule an appointment  If you have any questions or concerns before your next appointment please send [place] a message through [place] or call our office at [phone].    TO LEAVE A MESSAGE FOR THE NURSE SELECT OPTION 2, PLEASE LEAVE A MESSAGE INCLUDING: YOUR NAME DATE OF BIRTH CALL BACK NUMBER REASON FOR CALL**this is important as we prioritize the call backs  YOU WILL RECEIVE A CALL BACK THE SAME DAY AS LONG AS YOU CALL BEFORE 4:00 PM  At the [facility], you and your health needs are our priority. As part of our continuing mission to provide you with exceptional heart care, we have created designated Provider Care Teams. These Care Teams include your primary Cardiologist (physician) and Advanced Practice Providers (APPs- Physician Assistants and Nurse Practitioners) who all work together to provide you with the care you need, when you need it.   You may see any of the following providers on your designated Care Team at your next follow up: Dr Arvilla Meres Dr Marca Ancona Dr. Dorthula Nettles Dr. Clearnce Hasten Amy Filbert Schilder, NP Robbie Lis, [place] [facility] [place], [place] Brynda Peon, NP [place] Lee, NP Karle Plumber, PharmD   Please be sure to bring in all your medications bottles to every appointment.    Thank you for choosing [facility]

## 2023-07-07 ENCOUNTER — Telehealth (HOSPITAL_COMMUNITY): Payer: Self-pay

## 2023-07-07 ENCOUNTER — Encounter (HOSPITAL_COMMUNITY): Payer: Self-pay

## 2023-07-07 MED ORDER — EZETIMIBE 10 MG PO TABS: 10.0000 mg | ORAL_TABLET | Freq: Every day | ORAL | 3 refills | Status: AC

## 2023-07-07 NOTE — Telephone Encounter (Addendum)
Zetia ordered   ----- Message from Marca Ancona sent at 07/07/2023 10:05 AM EST ----- That is fine but it is not very effective.

## 2023-07-14 ENCOUNTER — Other Ambulatory Visit (HOSPITAL_COMMUNITY): Payer: Self-pay | Admitting: *Deleted

## 2023-07-17 ENCOUNTER — Ambulatory Visit (HOSPITAL_COMMUNITY)
Admission: RE | Admit: 2023-07-17 | Discharge: 2023-07-17 | Disposition: A | Discharge status: Home / Self Care | Payer: Medicare HMO | Source: Ambulatory Visit | Attending: Endocrinology | Admitting: Endocrinology

## 2023-07-17 DIAGNOSIS — M810 Age-related osteoporosis without current pathological fracture: Secondary | ICD-10-CM | POA: Insufficient documentation

## 2023-07-17 MED ORDER — ZOLEDRONIC ACID 5 MG/100ML IV SOLN
INTRAVENOUS | Status: AC
  Administered 2023-07-17: 5 mg via INTRAVENOUS
  Filled 2023-07-17: qty 100

## 2023-07-17 MED ORDER — ZOLEDRONIC ACID 5 MG/100ML IV SOLN: 5.0000 mg | Freq: Once | INTRAVENOUS | Status: AC

## 2023-08-26 ENCOUNTER — Other Ambulatory Visit (HOSPITAL_COMMUNITY): Payer: Self-pay | Admitting: Cardiology

## 2023-08-26 DIAGNOSIS — E785 Hyperlipidemia, unspecified: Secondary | ICD-10-CM | POA: Diagnosis not present

## 2023-08-26 DIAGNOSIS — Z1212 Encounter for screening for malignant neoplasm of rectum: Secondary | ICD-10-CM | POA: Diagnosis not present

## 2023-08-26 DIAGNOSIS — M810 Age-related osteoporosis without current pathological fracture: Secondary | ICD-10-CM | POA: Diagnosis not present

## 2023-08-26 DIAGNOSIS — E291 Testicular hypofunction: Secondary | ICD-10-CM | POA: Diagnosis not present

## 2023-08-26 DIAGNOSIS — R7303 Prediabetes: Secondary | ICD-10-CM | POA: Diagnosis not present

## 2023-08-26 DIAGNOSIS — I10 Essential (primary) hypertension: Secondary | ICD-10-CM | POA: Diagnosis not present

## 2023-08-26 DIAGNOSIS — Z125 Encounter for screening for malignant neoplasm of prostate: Secondary | ICD-10-CM | POA: Diagnosis not present

## 2023-09-02 DIAGNOSIS — E785 Hyperlipidemia, unspecified: Secondary | ICD-10-CM | POA: Diagnosis not present

## 2023-09-02 DIAGNOSIS — J4530 Mild persistent asthma, uncomplicated: Secondary | ICD-10-CM | POA: Diagnosis not present

## 2023-09-02 DIAGNOSIS — I10 Essential (primary) hypertension: Secondary | ICD-10-CM | POA: Diagnosis not present

## 2023-09-02 DIAGNOSIS — E291 Testicular hypofunction: Secondary | ICD-10-CM | POA: Diagnosis not present

## 2023-09-02 DIAGNOSIS — I700 Atherosclerosis of aorta: Secondary | ICD-10-CM | POA: Diagnosis not present

## 2023-09-02 DIAGNOSIS — M810 Age-related osteoporosis without current pathological fracture: Secondary | ICD-10-CM | POA: Diagnosis not present

## 2023-09-02 DIAGNOSIS — K222 Esophageal obstruction: Secondary | ICD-10-CM | POA: Diagnosis not present

## 2023-09-02 DIAGNOSIS — I2510 Atherosclerotic heart disease of native coronary artery without angina pectoris: Secondary | ICD-10-CM | POA: Diagnosis not present

## 2023-09-02 DIAGNOSIS — I255 Ischemic cardiomyopathy: Secondary | ICD-10-CM | POA: Diagnosis not present

## 2023-09-02 DIAGNOSIS — Z85038 Personal history of other malignant neoplasm of large intestine: Secondary | ICD-10-CM | POA: Diagnosis not present

## 2023-09-02 DIAGNOSIS — K219 Gastro-esophageal reflux disease without esophagitis: Secondary | ICD-10-CM | POA: Diagnosis not present

## 2023-09-02 DIAGNOSIS — R7303 Prediabetes: Secondary | ICD-10-CM | POA: Diagnosis not present

## 2023-09-25 ENCOUNTER — Other Ambulatory Visit (HOSPITAL_COMMUNITY): Payer: Self-pay | Admitting: Cardiology

## 2023-10-12 ENCOUNTER — Other Ambulatory Visit: Payer: Self-pay | Admitting: Emergency Medicine

## 2023-10-14 DIAGNOSIS — J069 Acute upper respiratory infection, unspecified: Secondary | ICD-10-CM | POA: Diagnosis not present

## 2023-10-14 DIAGNOSIS — R051 Acute cough: Secondary | ICD-10-CM | POA: Diagnosis not present

## 2023-10-14 DIAGNOSIS — I5189 Other ill-defined heart diseases: Secondary | ICD-10-CM | POA: Diagnosis not present

## 2023-10-14 DIAGNOSIS — J40 Bronchitis, not specified as acute or chronic: Secondary | ICD-10-CM | POA: Diagnosis not present

## 2023-10-14 DIAGNOSIS — I10 Essential (primary) hypertension: Secondary | ICD-10-CM | POA: Diagnosis not present

## 2023-10-14 DIAGNOSIS — J4530 Mild persistent asthma, uncomplicated: Secondary | ICD-10-CM | POA: Diagnosis not present

## 2023-10-17 ENCOUNTER — Other Ambulatory Visit: Payer: Self-pay | Admitting: Emergency Medicine

## 2023-11-13 ENCOUNTER — Telehealth: Payer: Self-pay

## 2023-11-14 NOTE — Telephone Encounter (Signed)
 Duplicate encounter

## 2023-11-16 ENCOUNTER — Telehealth: Payer: Self-pay

## 2023-11-16 DIAGNOSIS — J45909 Unspecified asthma, uncomplicated: Secondary | ICD-10-CM

## 2023-11-16 MED ORDER — OMALIZUMAB 150 MG/ML ~~LOC~~ SOSY: 300.0000 mg | PREFILLED_SYRINGE | SUBCUTANEOUS | 1 refills | Status: DC

## 2023-11-16 NOTE — Telephone Encounter (Signed)
 Copied from CRM [id number]. Topic: Clinical - Prescription Issue >> Nov 12, 2023  4:19 PM Jerry Montoya wrote: Reason for CRM: Patient is calling in to request that his provider or nurse call to a pharmaceutical company to assist him in obtaining more of his new injectable medication. Patient states the company cannot make outbound calls and requires inbound calls from providers. The name of the medication is Xolair  and patient is overdue for his next dosage currently. Please reach out to patient or to pharmaceutical company at [employer] [phone].

## 2023-11-16 NOTE — Telephone Encounter (Signed)
 Resent

## 2023-11-26 NOTE — Telephone Encounter (Signed)
 Spoke with [employer] to determine status of XOLAIR  patient assistance program enrollment. Patient is approved. His last shipment of Xolair  occurred on 11/20/2023.  Per representative, his forms are valid until 2027. However, patient does need to return their call to complete an annual income verification assessment.   Attempted to speak with patient to discuss this. Unable to reach him. LVM.   [employer]: [phone]  Tolu Regana Kemple, PharmD [facility] PGY-1

## 2023-12-25 ENCOUNTER — Telehealth: Payer: Self-pay

## 2023-12-25 NOTE — Telephone Encounter (Signed)
 Copied from CRM [id number]. Topic: Clinical - Prescription Issue >> Dec 25, 2023  8:30 AM Isabell A wrote: Reason for CRM: Patient is requesting to speak with Dr.Byrum's nurse - states insurance will no longer cover his XOPENEX  inhaler - would like to know what alternatives he can get.   Noted.  Thank you so much

## 2023-12-28 ENCOUNTER — Other Ambulatory Visit (HOSPITAL_COMMUNITY): Payer: Self-pay

## 2023-12-28 ENCOUNTER — Telehealth: Payer: Self-pay

## 2023-12-28 DIAGNOSIS — L814 Other melanin hyperpigmentation: Secondary | ICD-10-CM | POA: Diagnosis not present

## 2023-12-28 DIAGNOSIS — Z85828 Personal history of other malignant neoplasm of skin: Secondary | ICD-10-CM | POA: Diagnosis not present

## 2023-12-28 DIAGNOSIS — D1801 Hemangioma of skin and subcutaneous tissue: Secondary | ICD-10-CM | POA: Diagnosis not present

## 2023-12-28 DIAGNOSIS — D2272 Melanocytic nevi of left lower limb, including hip: Secondary | ICD-10-CM | POA: Diagnosis not present

## 2023-12-28 DIAGNOSIS — L570 Actinic keratosis: Secondary | ICD-10-CM | POA: Diagnosis not present

## 2023-12-28 NOTE — Telephone Encounter (Signed)
 Im showing generic Levalbuterol  HFA covered for $13.74

## 2023-12-28 NOTE — Telephone Encounter (Signed)
*  Pulm  Pharmacy Patient Advocate Encounter   Received notification from Pt Calls Messages that prior authorization for Levalbuterol  HFA is required/requested.   Insurance verification completed.   The patient is insured through [facility] .   Per test claim: The current 30 day co-pay is, $13.74.  No PA needed at this time. This test claim was processed through [facility]- copay amounts may vary at other pharmacies due to pharmacy/plan contracts, or as the patient moves through the different stages of their insurance plan.

## 2023-12-29 ENCOUNTER — Other Ambulatory Visit: Payer: Self-pay

## 2023-12-29 NOTE — Telephone Encounter (Signed)
 He has used it before and it has a reaction note / allergie note that states it caused him to have a heart attack.Aaron Aas

## 2023-12-30 ENCOUNTER — Other Ambulatory Visit: Payer: Self-pay

## 2023-12-30 NOTE — Telephone Encounter (Signed)
 Yes, when I went to placed the order that's what came up.

## 2023-12-30 NOTE — Telephone Encounter (Signed)
 Is this for the Levalbuterol ? I saw the note about the Albuterol  causing issues.

## 2024-01-05 NOTE — Telephone Encounter (Signed)
 Cost of Spiriva  ?

## 2024-02-03 ENCOUNTER — Telehealth: Payer: Self-pay | Admitting: Pharmacist

## 2024-02-03 ENCOUNTER — Other Ambulatory Visit (HOSPITAL_COMMUNITY): Payer: Self-pay

## 2024-02-03 DIAGNOSIS — J45909 Unspecified asthma, uncomplicated: Secondary | ICD-10-CM

## 2024-02-03 MED ORDER — BUDESONIDE-FORMOTEROL FUMARATE 160-4.5 MCG/ACT IN AERO: 2.0000 | INHALATION_SPRAY | Freq: Two times a day (BID) | RESPIRATORY_TRACT | 6 refills | Status: AC

## 2024-02-03 MED ORDER — LEVALBUTEROL TARTRATE 45 MCG/ACT IN AERO: 1.0000 | INHALATION_SPRAY | Freq: Four times a day (QID) | RESPIRATORY_TRACT | 5 refills | Status: DC | PRN

## 2024-02-03 NOTE — Telephone Encounter (Signed)
 Returned call to patient regarding medications. He was calling in regards to inhaler. States his Xopenex  was very expensive. I rana  test claim and it is ~$13 for one inhaler (generic)  Generic Symbicort  is ~$40. Advised him to continue Symbicort  with Xolair  as Xolair  is approved as an ADD-ON treatment  Refills for both medications sent to pharmacy  Sherry Pennant, PharmD, MPH, BCPS, CPP Clinical Pharmacist (Rheumatology and Pulmonology)

## 2024-02-03 NOTE — Telephone Encounter (Signed)
   Copied from CRM [id number]. Topic: General - Other >> Jan 29, 2024 10:53 AM Jerry Montoya wrote: Reason for CRM: Patient requesting call back from pharmacist when able.

## 2024-02-18 DIAGNOSIS — H524 Presbyopia: Secondary | ICD-10-CM | POA: Diagnosis not present

## 2024-02-18 DIAGNOSIS — H2513 Age-related nuclear cataract, bilateral: Secondary | ICD-10-CM | POA: Diagnosis not present

## 2024-03-23 DIAGNOSIS — E291 Testicular hypofunction: Secondary | ICD-10-CM | POA: Diagnosis not present

## 2024-03-23 DIAGNOSIS — R002 Palpitations: Secondary | ICD-10-CM | POA: Diagnosis not present

## 2024-03-31 ENCOUNTER — Other Ambulatory Visit: Payer: Self-pay | Admitting: Emergency Medicine

## 2024-03-31 DIAGNOSIS — J45909 Unspecified asthma, uncomplicated: Secondary | ICD-10-CM

## 2024-03-31 MED ORDER — OMALIZUMAB 150 MG/ML ~~LOC~~ SOSY: 300.0000 mg | PREFILLED_SYRINGE | SUBCUTANEOUS | 0 refills | Status: DC

## 2024-03-31 NOTE — Telephone Encounter (Signed)
 Refill sent for XOLAIR  to [facility]  Dose: 300mg  Reserve every 14 days   Last OV: 07/01/23 Provider: Dr. Shelah  Next OV: scheduled 06/10/24  Aleck Puls, PharmD, BCPS Clinical Pharmacist  [facility]

## 2024-03-31 NOTE — Telephone Encounter (Signed)
 Xolair

## 2024-03-31 NOTE — Telephone Encounter (Signed)
 Copied from CRM #[id number]. Topic: Clinical - Medication Refill >> Mar 31, 2024  3:08 PM Whitney O wrote: Medication: omalizumab  (XOLAIR ) 150 MG/ML prefilled syringe [[id number]]  Has the patient contacted their pharmacy? Yes the doctor has to send refill working on getting his last one  (Agent: If no, request that the patient contact the pharmacy for the refill. If patient does not wish to contact the pharmacy document the reason why and proceed with request.) (Agent: If yes, when and what did the pharmacy advise?)  This is the patient's preferred pharmacy:  [facility] - [place], [place] - [address] [place] [address] Phone: [phone] Fax: [phone]  Is this the correct pharmacy for this prescription? Yes If no, delete pharmacy and type the correct one.  [facility] - [place], [place] - [address] [place] [address] Phone: [phone] Fax: [phone]   Has the prescription been filled recently? No  Is the patient out of the medication? No has 1 more that's been sent out   Has the patient been seen for an appointment in the last year OR does the patient have an upcoming appointment? Yes  Can we respond through MyChart? Yes  Agent: Please be advised that Rx refills may take up to 3 business days. We ask that you follow-up with your pharmacy.

## 2024-04-29 ENCOUNTER — Ambulatory Visit: Admitting: Emergency Medicine

## 2024-05-25 DIAGNOSIS — S6391XA Sprain of unspecified part of right wrist and hand, initial encounter: Secondary | ICD-10-CM | POA: Diagnosis not present

## 2024-05-25 DIAGNOSIS — S66911A Strain of unspecified muscle, fascia and tendon at wrist and hand level, right hand, initial encounter: Secondary | ICD-10-CM | POA: Diagnosis not present

## 2024-05-25 DIAGNOSIS — M79641 Pain in right hand: Secondary | ICD-10-CM | POA: Diagnosis not present

## 2024-06-10 ENCOUNTER — Encounter: Payer: Self-pay | Admitting: Emergency Medicine

## 2024-06-10 ENCOUNTER — Ambulatory Visit: Admitting: Emergency Medicine

## 2024-06-10 VITALS — BP 132/68 | HR 95 | Temp 98.0°F | Ht 70.0 in | Wt 169.2 lb

## 2024-06-10 DIAGNOSIS — Z23 Encounter for immunization: Secondary | ICD-10-CM

## 2024-06-10 DIAGNOSIS — K219 Gastro-esophageal reflux disease without esophagitis: Secondary | ICD-10-CM | POA: Diagnosis not present

## 2024-06-10 DIAGNOSIS — J301 Allergic rhinitis due to pollen: Secondary | ICD-10-CM

## 2024-06-10 DIAGNOSIS — J45909 Unspecified asthma, uncomplicated: Secondary | ICD-10-CM | POA: Diagnosis not present

## 2024-06-10 MED ORDER — OMALIZUMAB 150 MG/ML ~~LOC~~ SOSY: 300.0000 mg | PREFILLED_SYRINGE | SUBCUTANEOUS | 0 refills | Status: DC

## 2024-06-10 MED ORDER — FLUTICASONE PROPIONATE 50 MCG/ACT NA SUSP: 2.0000 | Freq: Every day | NASAL | 0 refills | Status: DC

## 2024-06-10 MED ORDER — LEVALBUTEROL TARTRATE 45 MCG/ACT IN AERO: 1.0000 | INHALATION_SPRAY | Freq: Four times a day (QID) | RESPIRATORY_TRACT | 5 refills | Status: AC | PRN

## 2024-06-10 NOTE — Progress Notes (Signed)
   Subjective:    Patient ID: Jerry Montoya, male    DOB: 09/22/1955, 68 y.o.   MRN: [id number]  Asthma His past medical history is significant for asthma.    ROV 07/01/2023 --follow-up visit for 68 year old male with history of longstanding asthma, coronary disease with ischemic cardiomyopathy, colon cancer, hypertension, GERD, chronic rhinitis.  After I saw him in April we referred him to initiate Xolair  to help with his allergy  and asthma.  Today he reports that he believes that the xolair  has been helpful, has decreased his albuterol  use, better breathing - although he has had more congestion last month with clearing the leaves. He is exercising more. The xolair  comes [employer] - has had some difficulty getting it signed for. He is working w the company to correct this. Remains on Symbicort , flonase , protonix . He was on prednisone  in November after a URI.   ROV 06/10/2024 --Jerry Montoya is 2 with a history of chronic allergic asthma on Xolair .  Also managed on Symbicort , Flonase , Protonix  for GERD which has been a contributor. Also w hx of CAD with ischemic cardiomyopathy, colon cancer, hypertension, GERD and chronic rhinitis. Good exercise tolerance - going to the [facility]. Occasional albuterol  use. He has stopped using his symbicort  regularly - about once a week. Only using loratadine , flonase  prn. No flares since last time. Needs the flu shot.    Review of Systems As per HPI      Objective:   Physical Exam Vitals:   06/10/24 0912  BP: 132/68  Pulse: 95  Temp: 98 F (36.7 C)  SpO2: 95%  Weight: 169 lb 3.2 oz (76.7 kg)  Height: 5' 10 (1.778 m)   Gen: Pleasant, well-nourished, in no distress,  normal affect  ENT: No lesions,  mouth clear,  oropharynx clear, no postnasal drip  Neck: No JVD, loud stridor on exp  Lungs: No use of accessory muscles, no crackles or wheezing on normal respiration, no wheeze on forced expiration, referred UA noise  Cardiovascular: RRR, heart sounds normal,  no murmur or gallops, no peripheral edema  Musculoskeletal: No deformities, no cyanosis or clubbing  Neuro: alert, awake, non focal  Skin: Warm, no lesions or rash      Assessment & Plan:  Asthma Great response to his Xolair .  He has only been using Symbicort  rarely.  We will try to stop it and see if he can get away with this.  He can use albuterol  or Xopenex  as needed.  Continue to try to control rhinitis and GERD.  Please continue Xolair  as you have been taking it.  We will place refills for this today. Try stopping your Symbicort  altogether. Keep your albuterol  or Xopenex  available to use 2 puffs up to every 4 hours if you are to needed for shortness of breath, chest tightness, wheezing. Flu shot today Follow Dr. Shelah in 1 year, sooner if you have any problems.  Allergic rhinitis Xolair  as above.  He is using loratadine  and fluticasone  nasal spray as needed  GERD (gastroesophageal reflux disease) Continue to control his GERD with pantoprazole .    Lamar Shelah, MD, PhD 06/10/2024, 9:42 AM [facility] [phone] or if no answer [phone]

## 2024-06-10 NOTE — Addendum Note (Signed)
 Addended byBETHA FRIES, Karion Cudd A on: 06/10/2024 09:46 AM   Modules accepted: Orders

## 2024-06-10 NOTE — Patient Instructions (Addendum)
 Please continue Xolair  as you have been taking it.  We will place refills for this today. Try stopping your Symbicort  altogether. Keep your albuterol  or Xopenex  available to use 2 puffs up to every 4 hours if you are to needed for shortness of breath, chest tightness, wheezing. Flu shot today Use your fluticasone  nasal spray, 2 sprays each nostril once daily as needed for congestion and drainage Use your loratadine  10 mg once daily as needed for congestion and drainage Continue Protonix  once daily Follow Dr. Shelah in 1 year, sooner if you have any problems.

## 2024-06-10 NOTE — Assessment & Plan Note (Signed)
 Xolair  as above.  He is using loratadine  and fluticasone  nasal spray as needed

## 2024-06-10 NOTE — Assessment & Plan Note (Signed)
 Continue to control his GERD with pantoprazole .

## 2024-06-10 NOTE — Assessment & Plan Note (Signed)
 Great response to his Xolair .  He has only been using Symbicort  rarely.  We will try to stop it and see if he can get away with this.  He can use albuterol  or Xopenex  as needed.  Continue to try to control rhinitis and GERD.  Please continue Xolair  as you have been taking it.  We will place refills for this today. Try stopping your Symbicort  altogether. Keep your albuterol  or Xopenex  available to use 2 puffs up to every 4 hours if you are to needed for shortness of breath, chest tightness, wheezing. Flu shot today Follow Dr. Shelah in 1 year, sooner if you have any problems.

## 2024-06-15 ENCOUNTER — Telehealth (HOSPITAL_COMMUNITY): Payer: Self-pay | Admitting: Cardiology

## 2024-06-15 NOTE — Telephone Encounter (Signed)
 Called to confirm/remind patient of their appointment at the [facility] on 06/15/2024.   Appointment:   [x] Confirmed  [] Left mess   [] No answer/No voice mail  [] VM Full/unable to leave message  [] Phone not in service  Patient reminded to bring all medications and/or complete list.  Confirmed patient has transportation. Gave directions, instructed to utilize valet parking.

## 2024-06-20 ENCOUNTER — Encounter (HOSPITAL_COMMUNITY): Payer: Self-pay | Admitting: Cardiology

## 2024-06-20 ENCOUNTER — Telehealth (HOSPITAL_COMMUNITY): Payer: Self-pay | Admitting: *Deleted

## 2024-06-20 ENCOUNTER — Ambulatory Visit (HOSPITAL_COMMUNITY)
Admission: RE | Admit: 2024-06-20 | Discharge: 2024-06-20 | Disposition: A | Source: Ambulatory Visit | Attending: Cardiology | Admitting: Cardiology

## 2024-06-20 ENCOUNTER — Ambulatory Visit (HOSPITAL_COMMUNITY): Payer: Self-pay | Admitting: Cardiology

## 2024-06-20 VITALS — HR 126 | Wt 166.2 lb

## 2024-06-20 DIAGNOSIS — I110 Hypertensive heart disease with heart failure: Secondary | ICD-10-CM | POA: Insufficient documentation

## 2024-06-20 DIAGNOSIS — Z7902 Long term (current) use of antithrombotics/antiplatelets: Secondary | ICD-10-CM | POA: Insufficient documentation

## 2024-06-20 DIAGNOSIS — E782 Mixed hyperlipidemia: Secondary | ICD-10-CM | POA: Diagnosis not present

## 2024-06-20 DIAGNOSIS — I25119 Atherosclerotic heart disease of native coronary artery with unspecified angina pectoris: Secondary | ICD-10-CM | POA: Diagnosis not present

## 2024-06-20 DIAGNOSIS — J45909 Unspecified asthma, uncomplicated: Secondary | ICD-10-CM | POA: Diagnosis not present

## 2024-06-20 DIAGNOSIS — Z955 Presence of coronary angioplasty implant and graft: Secondary | ICD-10-CM | POA: Diagnosis not present

## 2024-06-20 DIAGNOSIS — Z79899 Other long term (current) drug therapy: Secondary | ICD-10-CM | POA: Insufficient documentation

## 2024-06-20 DIAGNOSIS — I252 Old myocardial infarction: Secondary | ICD-10-CM | POA: Insufficient documentation

## 2024-06-20 DIAGNOSIS — E785 Hyperlipidemia, unspecified: Secondary | ICD-10-CM | POA: Diagnosis not present

## 2024-06-20 DIAGNOSIS — Z85038 Personal history of other malignant neoplasm of large intestine: Secondary | ICD-10-CM | POA: Diagnosis not present

## 2024-06-20 DIAGNOSIS — I5022 Chronic systolic (congestive) heart failure: Secondary | ICD-10-CM | POA: Diagnosis not present

## 2024-06-20 DIAGNOSIS — I255 Ischemic cardiomyopathy: Secondary | ICD-10-CM | POA: Diagnosis not present

## 2024-06-20 DIAGNOSIS — I2510 Atherosclerotic heart disease of native coronary artery without angina pectoris: Secondary | ICD-10-CM | POA: Insufficient documentation

## 2024-06-20 DIAGNOSIS — Z91148 Patient's other noncompliance with medication regimen for other reason: Secondary | ICD-10-CM | POA: Insufficient documentation

## 2024-06-20 LAB — LIPID PANEL
Cholesterol: 117 mg/dL (ref 0–200)
HDL: 48 mg/dL (ref >40)
LDL Cholesterol: 56 mg/dL (ref 0–99)
Total CHOL/HDL Ratio: 2.4 ratio
Triglycerides: 63 mg/dL (ref <150)
VLDL: 13 mg/dL (ref 0–40)

## 2024-06-20 NOTE — Progress Notes (Signed)
 PCP: Nichole Senior, MD Cardiology: Dr. Rolan  Chief complaint: CAD  68 y.o. with history of asthma and colon cancer was admitted in 10/15 with anterior STEMI.  He had fresh total occlusion of the mid LAD and chronic total occlusion of the mid LCx with collaterals.  He had Xience DES to LAD.  Echo that admission showed EF 45-50%.  Repeat echo in 1/16 showed EF up to 55-60%.  In 5/17, he had a Cardiolite  that was low risk with a fixed apical inferior defect and no ischemia. In 10/19, he had LHC due to angina showing CTO proximal LCx with collaterals, 60% pLAD, 90% D1 stenosis.  He had DES to D1.   Patient had right inguinal hernia repair in 8/20.   Echo 12/21 showed EF 55-60% with normal wall motion.  With ongoing exertional chest pain and dyspnea, he had LHC in 1/22.  This showed stable chronically occluded LCX with nonobstructive CAD elsewhere.   He saw pulmonology and was started on Symbicort , later changed to Xolair  for his asthma.   CPX 05/07/21: Low normal functional capacity. Ventilatory limitation at peak exercise with moderate obstruction on resting spirometry. No obvious HF limitation.  Patient returns for followup of CAD.  Asthma is now better-controlled on Xolair .  He developed some wheezing after getting the flu shot last week and has been using his prn Xopenex .  HR is elevated at 110s today, sinus rhythm.  He took Xopenex  this morning.  He is going to the [facility] for exercise.  No exertional dyspnea or chest pain.  No palpitations.  He has had spotty compliance in the past with meds but says he is taking everything currently.   Labs (7/23): K 4, creatinine 0.83 Labs (9/23): LDL 166 Labs (10/23): creatinine 0.87 Labs (12/24): K 4, creatinine 0.94, LDL 107, HDL 53  ECG (personally reviewed): Sinus tachy 119, otherwise normal.   PMH: 1. Asthma: since his 30s.  2. Colon cancer: s/p sigmoid colectomy in 2015.  3. Hyperlipidemia 4. GERD 5. CAD: Anterior STEMI in 10/15, LHC  showed total occlusion mid LAD and chronic total occlusion mid LCx with collaterals.  He had Xience DES to the LAD.   - Cardiolite  (5/17) with fixed apical inferior defect, no ischemia.  Low risk study.  EF 58%.  - LHC (10/19) done for angina: CTO proximal LCx with collaterals, 60% pLAD, 90% D1 stenosis.  He had DES to D1.  - LHC (1/22): done for chest tightness/dyspnea. Known occluded LCx with collaterals from the RCA. Stable nonobstructive disease in the LAD and RCA.  6. Ischemic cardiomyopathy: Echo (10/15) with EF 45-50%, anterior/anteroseptal/apical severe hypokinesis.  Echo (1/16) with EF 55-60%.  - Echo (12/19): EF 55-60%, mild MR.  - Echo (01/22): EF 55-60% with normal wall motion.  - CPX (10/22): Low normal functional capacity. Ventilatory limitation at peak exercise with moderate obstruction on resting spirometry. No obvious HF limitation noted.  7. Right inguinal hernia repair 8/20  SH: [employer], married Jerry Montoya), nonsmoker.   FH: Father with CAD.   ROS: All systems reviewed and negative except as per HPI.   Current Outpatient Medications  Medication Sig Dispense Refill   budesonide -formoterol  (SYMBICORT ) 160-4.5 MCG/ACT inhaler Inhale 2 puffs into the lungs 2 (two) times daily. 10.2 g 6   Cholecalciferol (VITAMIN D3) 125 MCG (5000 UT) CAPS Take 1 capsule by mouth daily.     clopidogrel  (PLAVIX ) 75 MG tablet Take 1 tablet (75 mg total) by mouth daily. 90 tablet 3  EPINEPHrine  (EPIPEN  2-PAK) 0.3 mg/0.3 mL IJ SOAJ injection Inject 0.3 mg into the skin once as needed for anaphylaxis. 1 each 0   EQ ALL DAY ALLERGY  RELIEF 10 MG tablet Take 1 tablet by mouth once daily 30 tablet 0   ezetimibe  (ZETIA ) 10 MG tablet Take 1 tablet (10 mg total) by mouth daily. 90 tablet 3   fluticasone  (FLONASE ) 50 MCG/ACT nasal spray Place 2 sprays into both nostrils daily. 16 g 0   levalbuterol  (XOPENEX  HFA) 45 MCG/ACT inhaler Inhale 1-2 puffs into the lungs every 6 (six) hours as needed for  wheezing. **DISPENSE GENERIC** 1 each 5   levalbuterol  (XOPENEX ) 0.63 MG/3ML nebulizer solution Take 3 mLs (0.63 mg total) by nebulization every 6 (six) hours as needed for wheezing or shortness of breath. 120 mL 3   nitroGLYCERIN  (NITROSTAT ) 0.4 MG SL tablet DISSOLVE ONE TABLET UNDER THE TONGUE EVERY 5 MINUTES AS NEEDED FOR CHEST PAIN.  DO NOT EXCEED A TOTAL OF 3 DOSES IN 15 MINUTES 25 tablet 0   omalizumab  (XOLAIR ) 150 MG/ML prefilled syringe Inject 300 mg into the skin every 14 (fourteen) days. Deliver to patient home. 12 mL 0   pantoprazole  (PROTONIX ) 40 MG tablet TAKE 1 TABLET BY MOUTH  DAILY 90 tablet 3   rosuvastatin  (CRESTOR ) 40 MG tablet Take 1 tablet (40 mg total) by mouth daily. 30 tablet 6   tadalafil (CIALIS) 5 MG tablet Take 5 mg by mouth daily.     losartan  (COZAAR ) 50 MG tablet Take 1.5 tablets (75 mg total) by mouth daily. (Patient not taking: Reported on 06/20/2024) 135 tablet 3   No current facility-administered medications for this encounter.   Wt Readings from Last 3 Encounters:  06/20/24 75.4 kg (166 lb 3.2 oz)  06/10/24 76.7 kg (169 lb 3.2 oz)  07/17/23 74.4 kg (164 lb)   Pulse (!) 126   Wt 75.4 kg (166 lb 3.2 oz)   SpO2 96%   BMI 23.85 kg/m  General: NAD Neck: No JVD, no thyromegaly or thyroid  nodule.  Lungs: Prolonged expiratory phase without frank wheezing.  CV: Nondisplaced PMI.  Heart tachycardic, regular S1/S2, no S3/S4, no murmur.  No peripheral edema.  No carotid bruit.  Normal pedal pulses.  Abdomen: Soft, nontender, no hepatosplenomegaly, no distention.  Skin: Intact without lesions or rashes.  Neurologic: Alert and oriented x 3.  Psych: Normal affect. Extremities: No clubbing or cyanosis.  HEENT: Normal.   Assessment/Plan: 1. CAD: s/p anterior STEMI 10/15 with DES to mLAD.  Also had CTO mid LCx with collaterals that was not treated percutaneously.  In 10/19, he had crescendo angina.  Cath with 90% D1 stenosis, had DES to D1.  Echo 1/22 EF 55-60%.  LHC  1/22 showed known occluded LCx with collaterals from the RCA; stable nonobstructive disease in the LAD & RCA.  No recent chest pain.  - Continue Plavix  75 mg daily.   - He is off bisoprolol  with poor asthma control.  - Continue Crestor  40 mg daily, lipids today.  2. Ischemic cardiomyopathy with recovery in LV function: EF 45-50% on initial echo, improved to 55-60% in 1/16.  Echo 12/19 with EF 55-60%.  Echo 1/22 showed EF 55-60% with normal RV.  CPX 10/22 showed low normal functional capacity. Ventilatory limitation at peak exercise with moderate obstruction on resting spirometry. No obvious HF limitation noted.  No significant exertional dyspnea.  - Off losartan  with lower BP, says SBP 110s-120s at home. With recovered EF, I do not think  that he needs to restart.  - Off bisoprolol  with previous wheezing/asthma. 3. Hyperlipidemia: Continue Crestor , check lipids today. Goal LDL < 55.  4. HTN: BP controlled.  5. Asthma: Follow-up with Dr. Shelah. I suspect today's sinus tachycardia was caused by dose of Xopenex  taken this morning prior to the visit.  6. Medication noncompliance: He seems to be doing better with taking his meds.    Followup in 6 months.   I spent 31 minutes reviewing chart, interacting with patient, and managing orders.   Ezra Shuck 06/20/2024

## 2024-06-20 NOTE — Patient Instructions (Signed)
 Medication Changes:  None, continue current medications  Lab Work:  Labs done today, your results will be available in MyChart, we will contact you for abnormal readings.  Follow-Up in: 6 months (June 2026), **PLEASE CALL OUR OFFICE IN [place] TO SCHEDULE THIS APPOINTMENT   At the [facility], you and your health needs are our priority. We have a designated team specialized in the treatment of Heart Failure. This Care Team includes your primary Heart Failure Specialized Cardiologist (physician), Advanced Practice Providers (APPs- Physician Assistants and Nurse Practitioners), and Pharmacist who all work together to provide you with the care you need, when you need it.   You may see any of the following providers on your designated Care Team at your next follow up:  Dr. Toribio Fuel Dr. Ezra Shuck Dr. Odis Brownie Greig Mosses, NP Caffie Shed, [place] [facility] [place], [place] Beckey Coe, NP Jordan Lee, NP Tinnie Redman, PharmD   Please be sure to bring in all your medications bottles to every appointment.   Need to Contact Us :  If you have any questions or concerns before your next appointment please send us  a message through [place] or call our office at [phone].    TO LEAVE A MESSAGE FOR THE NURSE SELECT OPTION 2, PLEASE LEAVE A MESSAGE INCLUDING: YOUR NAME DATE OF BIRTH CALL BACK NUMBER REASON FOR CALL**this is important as we prioritize the call backs  YOU WILL RECEIVE A CALL BACK THE SAME DAY AS LONG AS YOU CALL BEFORE 4:00 PM

## 2024-06-20 NOTE — Telephone Encounter (Signed)
 Called patient per Dr. Rolan with following:  LDL much better at 56, no changes.  Pt verbalized understanding of same.

## 2024-06-21 LAB — LIPOPROTEIN A (LPA): Lipoprotein (a): 159.8 nmol/L — ABNORMAL HIGH (ref <75.0)

## 2024-06-24 ENCOUNTER — Encounter (HOSPITAL_COMMUNITY): Payer: Self-pay | Admitting: General Surgery

## 2024-07-27 ENCOUNTER — Telehealth: Payer: Self-pay | Admitting: *Deleted

## 2024-07-27 DIAGNOSIS — J45909 Unspecified asthma, uncomplicated: Secondary | ICD-10-CM

## 2024-07-27 NOTE — Telephone Encounter (Signed)
 Copied from CRM [id number]. Topic: Clinical - Medication Question >> Jul 26, 2024  4:15 PM Dedra B wrote: Reason for CRM: Patient is out of omalizumab  (XOLAIR ) 150 MG/ML prefilled syringe and needs Dr. Shelah or his nurse to give [employer] a verbal order. [employer]'s phone number is [phone] and fax number is [phone].

## 2024-07-28 MED ORDER — OMALIZUMAB 150 MG/ML ~~LOC~~ SOSY: 300.0000 mg | PREFILLED_SYRINGE | SUBCUTANEOUS | 5 refills | Status: AC

## 2024-07-28 NOTE — Telephone Encounter (Signed)
 Verbal order for Xolair  300mg  [place] every 14 days given to [facility] Pharmacist, Josh. QTY 4 mL for 28 day supply, 5 refills.

## 2024-08-06 ENCOUNTER — Other Ambulatory Visit: Payer: Self-pay | Admitting: Emergency Medicine

## 2024-08-10 ENCOUNTER — Other Ambulatory Visit: Payer: Self-pay | Admitting: Primary Care
